# Patient Record
Sex: Male | Born: 1950 | Race: White | Hispanic: No | Marital: Married | Smoking: Current every day smoker
Health system: Southern US, Community
[De-identification: ages and names within clinical notes are randomized; demographics above are authoritative.]

## PROBLEM LIST (undated history)

## (undated) DIAGNOSIS — R519 Headache, unspecified: Secondary | ICD-10-CM

## (undated) DIAGNOSIS — Z9289 Personal history of other medical treatment: Secondary | ICD-10-CM

## (undated) DIAGNOSIS — F0280 Dementia in other diseases classified elsewhere without behavioral disturbance: Secondary | ICD-10-CM

## (undated) DIAGNOSIS — F4310 Post-traumatic stress disorder, unspecified: Secondary | ICD-10-CM

## (undated) DIAGNOSIS — M1990 Unspecified osteoarthritis, unspecified site: Secondary | ICD-10-CM

## (undated) DIAGNOSIS — G309 Alzheimer's disease, unspecified: Secondary | ICD-10-CM

## (undated) DIAGNOSIS — R51 Headache: Secondary | ICD-10-CM

## (undated) HISTORY — PX: APPENDECTOMY: SHX54

## (undated) HISTORY — PX: COLONOSCOPY: SHX174

---

## 2000-01-03 ENCOUNTER — Encounter: Payer: Self-pay | Admitting: Internal Medicine

## 2000-01-03 ENCOUNTER — Encounter: Admission: RE | Admit: 2000-01-03 | Discharge: 2000-01-03 | Payer: Self-pay | Admitting: Internal Medicine

## 2000-01-12 ENCOUNTER — Encounter: Payer: Self-pay | Admitting: Neurosurgery

## 2000-01-12 ENCOUNTER — Encounter: Admission: RE | Admit: 2000-01-12 | Discharge: 2000-01-12 | Payer: Self-pay | Admitting: Neurosurgery

## 2010-05-05 DEATH — deceased

## 2013-04-26 ENCOUNTER — Encounter: Payer: Self-pay | Admitting: Physician Assistant

## 2013-04-26 ENCOUNTER — Ambulatory Visit (INDEPENDENT_AMBULATORY_CARE_PROVIDER_SITE_OTHER): Payer: BC Managed Care – PPO | Admitting: Physician Assistant

## 2013-04-26 VITALS — BP 140/80 | HR 78 | Temp 97.8°F | Resp 18 | Ht 69.0 in | Wt 179.0 lb

## 2013-04-26 DIAGNOSIS — F411 Generalized anxiety disorder: Secondary | ICD-10-CM

## 2013-04-26 DIAGNOSIS — M542 Cervicalgia: Secondary | ICD-10-CM

## 2013-04-26 MED ORDER — MELOXICAM 7.5 MG PO TABS: 7.5000 mg | ORAL_TABLET | Freq: Every day | ORAL | Status: DC

## 2013-04-26 MED ORDER — ALPRAZOLAM 0.5 MG PO TABS: 0.5000 mg | ORAL_TABLET | Freq: Three times a day (TID) | ORAL | Status: DC | PRN

## 2013-04-26 MED ORDER — ESCITALOPRAM OXALATE 10 MG PO TABS: 10.0000 mg | ORAL_TABLET | Freq: Every day | ORAL | Status: DC

## 2013-04-26 MED ORDER — METAXALONE 800 MG PO TABS: 800.0000 mg | ORAL_TABLET | Freq: Four times a day (QID) | ORAL | Status: DC

## 2013-04-27 NOTE — Progress Notes (Signed)
Patient ID: Austin Dillon MRN: [id number], DOB: 1951/04/22, 62 y.o. Date of Encounter: @DATE @  Chief Complaint:  Chief Complaint  Patient presents with  . other    new pt, get est, h/a fotr awhile    HPI: 62 y.o. year old male  presents as a new patient to our office. He has had no medical visit in over 5 years. No PMH. Takes no meds.  Came in today b/c he has been feeling "stressed and a nervous feeling." Feels anxious all the time. Has no particular increased stress to prompt this. Also c/o "headache" which he says is in back of neck into back of head.  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 He is getting married soon. Nothing is really going on in his life that is more stressful than usual.  He says he cant stand feeling this way any longer.   History reviewed. No pertinent past medical history.   Home Meds: See attached medication section for current medication list. Any medications entered into computer today will not appear on this note's list. The medications listed below were entered prior to today. No current outpatient prescriptions on file prior to visit.   No current facility-administered medications on file prior to visit.    Allergies: No Known Allergies  History   Social History  . Marital Status: Married    Spouse Name: N/A    Number of Children: N/A  . Years of Education: N/A   Occupational History  . Not on file.   Social History Main Topics  . Smoking status: Current Every Day Smoker -- 1.00 packs/day for 25 years    Types: Cigarettes  . Smokeless tobacco: Not on file  . Alcohol Use: Not on file  . Drug Use: Not on file  . Sexually Active: Not on file   Other Topics Concern  . Not on file   Social History Narrative  . No narrative on file    No family history on file.   Review of Systems:  See HPI for pertinent ROS. All other ROS negative.    Physical Exam: Blood pressure  140/80, pulse 78, temperature 97.8 F (36.6 C), temperature source Oral, resp. rate 18, height 5\' 9"  (1.753 m), weight 179 lb (81.194 kg)., Body mass index is 26.42 kg/(m^2). General: WNWD WM. Appears in no acute distress. Neck: He has pain and tightness with palpation of posterior neck and bilateral neck and at insertion of muscles at back of head. He has good ROM but does report tightness sensation with this motion. Lungs: Clear bilaterally to auscultation without wheezes, rales, or rhonchi. Breathing is unlabored. Heart: RRR with S1 S2. No murmurs, rubs, or gallops. Musculoskeletal:  Strength and tone normal for age. Extremities/Skin: Warm and dry. No edema. Neuro: Alert and oriented X 3. Moves all extremities spontaneously. Gait is normal. CNII-XII grossly in tact. Psych:  Responds to questions appropriately with a normal affect.     ASSESSMENT AND PLAN:  62 y.o. year old male with  1. Generalized anxiety disorder Discussed proper use and expectations of each med at length. Call if any adverse effects with meds. O/W f/u OV in 6 weeks.  Cautioned that Xanax may make him feel "loopy" and not to take it before driving/operating machinery. - escitalopram (LEXAPRO) 10 MG tablet; Take 1 tablet (10 mg total) by mouth daily.  Dispense: 30 tablet; Refill: 1 - ALPRAZolam (XANAX) 0.5  MG tablet; Take 1 tablet (0.5 mg total) by mouth 3 (three) times daily as needed for anxiety.  Dispense: 30 tablet; Refill: 0  2. Neck pain Discussed proper use/expectation of meds at length. Take Mobic with food. Cautioned that Skelaxin may cause drowsiness. If this occus, only take it at night.  Also to use heat, stretches.  - metaxalone (SKELAXIN) 800 MG tablet; Take 1 tablet (800 mg total) by mouth 4 (four) times daily.  Dispense: 60 tablet; Refill: 0 - meloxicam (MOBIC) 7.5 MG tablet; Take 1 tablet (7.5 mg total) by mouth daily.  Dispense: 30 tablet; Refill: 0  He will have OV in 6 weeks to f/u these issues.  Once this is controlled, he is agreable to come in for CPE.  [place], [place], [facility] 04/27/2013 9:28 AM

## 2013-10-14 ENCOUNTER — Ambulatory Visit: Payer: Self-pay | Admitting: Gastroenterology

## 2013-10-15 LAB — PATHOLOGY REPORT

## 2013-11-20 ENCOUNTER — Ambulatory Visit: Payer: Self-pay | Admitting: Neurology

## 2014-05-22 DIAGNOSIS — R519 Headache, unspecified: Secondary | ICD-10-CM | POA: Insufficient documentation

## 2014-05-22 DIAGNOSIS — K279 Peptic ulcer, site unspecified, unspecified as acute or chronic, without hemorrhage or perforation: Secondary | ICD-10-CM | POA: Insufficient documentation

## 2015-02-25 ENCOUNTER — Ambulatory Visit: Payer: Self-pay | Admitting: Neurology

## 2016-03-30 DIAGNOSIS — F02818 Dementia in other diseases classified elsewhere, unspecified severity, with other behavioral disturbance: Secondary | ICD-10-CM | POA: Insufficient documentation

## 2016-03-30 DIAGNOSIS — E559 Vitamin D deficiency, unspecified: Secondary | ICD-10-CM | POA: Insufficient documentation

## 2016-03-30 DIAGNOSIS — Z720 Tobacco use: Secondary | ICD-10-CM | POA: Insufficient documentation

## 2016-09-03 DIAGNOSIS — G43719 Chronic migraine without aura, intractable, without status migrainosus: Secondary | ICD-10-CM | POA: Insufficient documentation

## 2017-03-08 ENCOUNTER — Other Ambulatory Visit: Payer: Self-pay | Admitting: Neurology

## 2017-03-08 DIAGNOSIS — R29810 Facial weakness: Secondary | ICD-10-CM

## 2017-03-17 ENCOUNTER — Ambulatory Visit: Payer: Medicare Other

## 2017-03-26 ENCOUNTER — Inpatient Hospital Stay (HOSPITAL_COMMUNITY)
Admission: EM | Admit: 2017-03-26 | Discharge: 2017-03-31 | DRG: 494 | Disposition: A | Discharge status: Skilled Nursing Facility | Payer: Medicare Other | Attending: Orthopedic Surgery | Admitting: Orthopedic Surgery

## 2017-03-26 ENCOUNTER — Emergency Department (HOSPITAL_COMMUNITY): Payer: Medicare Other | Admitting: Certified Registered"

## 2017-03-26 ENCOUNTER — Emergency Department (HOSPITAL_COMMUNITY): Payer: Medicare Other

## 2017-03-26 ENCOUNTER — Encounter (HOSPITAL_COMMUNITY)
Admission: EM | Disposition: A | Discharge status: Skilled Nursing Facility | Payer: Self-pay | Source: Home / Self Care | Attending: Orthopaedic Surgery

## 2017-03-26 ENCOUNTER — Encounter (HOSPITAL_COMMUNITY): Payer: Self-pay | Admitting: *Deleted

## 2017-03-26 DIAGNOSIS — W1789XA Other fall from one level to another, initial encounter: Secondary | ICD-10-CM | POA: Diagnosis present

## 2017-03-26 DIAGNOSIS — S82891B Other fracture of right lower leg, initial encounter for open fracture type I or II: Secondary | ICD-10-CM

## 2017-03-26 DIAGNOSIS — S82891C Other fracture of right lower leg, initial encounter for open fracture type IIIA, IIIB, or IIIC: Secondary | ICD-10-CM | POA: Diagnosis not present

## 2017-03-26 DIAGNOSIS — Z79899 Other long term (current) drug therapy: Secondary | ICD-10-CM | POA: Diagnosis not present

## 2017-03-26 DIAGNOSIS — S025XXA Fracture of tooth (traumatic), initial encounter for closed fracture: Secondary | ICD-10-CM | POA: Diagnosis present

## 2017-03-26 DIAGNOSIS — K056 Periodontal disease, unspecified: Secondary | ICD-10-CM | POA: Diagnosis present

## 2017-03-26 DIAGNOSIS — W19XXXA Unspecified fall, initial encounter: Secondary | ICD-10-CM

## 2017-03-26 DIAGNOSIS — F17210 Nicotine dependence, cigarettes, uncomplicated: Secondary | ICD-10-CM | POA: Diagnosis present

## 2017-03-26 DIAGNOSIS — Z7982 Long term (current) use of aspirin: Secondary | ICD-10-CM | POA: Diagnosis not present

## 2017-03-26 DIAGNOSIS — S82851B Displaced trimalleolar fracture of right lower leg, initial encounter for open fracture type I or II: Principal | ICD-10-CM | POA: Diagnosis present

## 2017-03-26 DIAGNOSIS — Y92009 Unspecified place in unspecified non-institutional (private) residence as the place of occurrence of the external cause: Secondary | ICD-10-CM

## 2017-03-26 DIAGNOSIS — Z791 Long term (current) use of non-steroidal anti-inflammatories (NSAID): Secondary | ICD-10-CM | POA: Diagnosis not present

## 2017-03-26 DIAGNOSIS — Z419 Encounter for procedure for purposes other than remedying health state, unspecified: Secondary | ICD-10-CM

## 2017-03-26 DIAGNOSIS — K029 Dental caries, unspecified: Secondary | ICD-10-CM | POA: Diagnosis present

## 2017-03-26 DIAGNOSIS — M25571 Pain in right ankle and joints of right foot: Secondary | ICD-10-CM | POA: Diagnosis present

## 2017-03-26 HISTORY — DX: Alzheimer's disease, unspecified: G30.9

## 2017-03-26 HISTORY — DX: Headache, unspecified: R51.9

## 2017-03-26 HISTORY — PX: EXTERNAL FIXATION LEG: SHX1549

## 2017-03-26 HISTORY — DX: Dementia in other diseases classified elsewhere, unspecified severity, without behavioral disturbance, psychotic disturbance, mood disturbance, and anxiety: F02.80

## 2017-03-26 HISTORY — DX: Headache: R51

## 2017-03-26 LAB — COMPREHENSIVE METABOLIC PANEL
ALK PHOS: 65 U/L (ref 38–126)
ALT: 49 U/L (ref 17–63)
ANION GAP: 15 (ref 5–15)
AST: 56 U/L — ABNORMAL HIGH (ref 15–41)
Albumin: 3.5 g/dL (ref 3.5–5.0)
BUN: 6 mg/dL (ref 6–20)
CALCIUM: 8.3 mg/dL — AB (ref 8.9–10.3)
CHLORIDE: 101 mmol/L (ref 101–111)
CO2: 19 mmol/L — AB (ref 22–32)
Creatinine, Ser: 1.03 mg/dL (ref 0.61–1.24)
GFR calc non Af Amer: >60 mL/min (ref >60)
GLUCOSE: 127 mg/dL — AB (ref 65–99)
POTASSIUM: 4.1 mmol/L (ref 3.5–5.1)
SODIUM: 135 mmol/L (ref 135–145)
Total Bilirubin: 1 mg/dL (ref 0.3–1.2)
Total Protein: 6.2 g/dL — ABNORMAL LOW (ref 6.5–8.1)

## 2017-03-26 LAB — CBC WITH DIFFERENTIAL/PLATELET
BASOS PCT: 0 %
Basophils Absolute: 0 10*3/uL (ref 0.0–0.1)
Eosinophils Absolute: 0.1 10*3/uL (ref 0.0–0.7)
Eosinophils Relative: 0 %
HCT: 41.5 % (ref 39.0–52.0)
HEMOGLOBIN: 14.4 g/dL (ref 13.0–17.0)
Lymphocytes Relative: 16 %
Lymphs Abs: 2.2 10*3/uL (ref 0.7–4.0)
MCH: 34.3 pg — AB (ref 26.0–34.0)
MCHC: 34.7 g/dL (ref 30.0–36.0)
MCV: 98.8 fL (ref 78.0–100.0)
MONOS PCT: 4 %
Monocytes Absolute: 0.5 10*3/uL (ref 0.1–1.0)
NEUTROS ABS: 11.1 10*3/uL — AB (ref 1.7–7.7)
NEUTROS PCT: 80 %
PLATELETS: 164 10*3/uL (ref 150–400)
RBC: 4.2 MIL/uL — ABNORMAL LOW (ref 4.22–5.81)
RDW: 12.1 % (ref 11.5–15.5)
WBC: 13.9 10*3/uL — ABNORMAL HIGH (ref 4.0–10.5)

## 2017-03-26 LAB — PROTIME-INR
INR: 1
Prothrombin Time: 13.2 seconds (ref 11.4–15.2)

## 2017-03-26 SURGERY — EXTERNAL FIXATION, LOWER EXTREMITY
Anesthesia: General | Site: Leg Lower | Laterality: Right

## 2017-03-26 MED ORDER — SUCCINYLCHOLINE CHLORIDE 200 MG/10ML IV SOSY
PREFILLED_SYRINGE | INTRAVENOUS | Status: AC
  Filled 2017-03-26: qty 10

## 2017-03-26 MED ORDER — DIPHENHYDRAMINE HCL 12.5 MG/5ML PO ELIX: 12.5000 mg | ORAL_SOLUTION | ORAL | Status: DC | PRN

## 2017-03-26 MED ORDER — PROPOFOL 10 MG/ML IV BOLUS
INTRAVENOUS | Status: DC | PRN
  Administered 2017-03-26: 150 mg via INTRAVENOUS

## 2017-03-26 MED ORDER — SUGAMMADEX SODIUM 200 MG/2ML IV SOLN
INTRAVENOUS | Status: DC | PRN
  Administered 2017-03-26: 200 mg via INTRAVENOUS

## 2017-03-26 MED ORDER — EPHEDRINE 5 MG/ML INJ
INTRAVENOUS | Status: AC
  Filled 2017-03-26: qty 10

## 2017-03-26 MED ORDER — CLINDAMYCIN PHOSPHATE 900 MG/50ML IV SOLN: 900.0000 mg | INTRAVENOUS | Status: DC

## 2017-03-26 MED ORDER — METHOCARBAMOL 500 MG PO TABS
500.0000 mg | ORAL_TABLET | Freq: Four times a day (QID) | ORAL | Status: DC | PRN
  Administered 2017-03-26 – 2017-03-29 (×8): 500 mg via ORAL
  Filled 2017-03-26 (×9): qty 1

## 2017-03-26 MED ORDER — PROPOFOL 10 MG/ML IV BOLUS
INTRAVENOUS | Status: AC
  Filled 2017-03-26: qty 20

## 2017-03-26 MED ORDER — CLINDAMYCIN PHOSPHATE 900 MG/50ML IV SOLN
INTRAVENOUS | Status: AC
Start: 2017-03-26 — End: 2017-03-26
  Filled 2017-03-26: qty 50

## 2017-03-26 MED ORDER — DULOXETINE HCL 20 MG PO CPEP
20.0000 mg | ORAL_CAPSULE | Freq: Every day | ORAL | Status: DC
  Administered 2017-03-27 – 2017-03-31 (×4): 20 mg via ORAL
  Filled 2017-03-26 (×5): qty 1

## 2017-03-26 MED ORDER — SODIUM CHLORIDE 0.9 % IR SOLN
Status: DC | PRN
  Administered 2017-03-26: 3000 mL

## 2017-03-26 MED ORDER — FENTANYL CITRATE (PF) 100 MCG/2ML IJ SOLN
100.0000 ug | Freq: Once | INTRAMUSCULAR | Status: AC
  Administered 2017-03-26: 100 ug via INTRAVENOUS
  Filled 2017-03-26: qty 2

## 2017-03-26 MED ORDER — ACETAMINOPHEN 650 MG RE SUPP: 650.0000 mg | Freq: Four times a day (QID) | RECTAL | Status: DC | PRN

## 2017-03-26 MED ORDER — MIDAZOLAM HCL 2 MG/2ML IJ SOLN
INTRAMUSCULAR | Status: AC
  Filled 2017-03-26: qty 2

## 2017-03-26 MED ORDER — ROCURONIUM BROMIDE 10 MG/ML (PF) SYRINGE
PREFILLED_SYRINGE | INTRAVENOUS | Status: AC
  Filled 2017-03-26: qty 5

## 2017-03-26 MED ORDER — POVIDONE-IODINE 10 % EX SWAB: 2.0000 "application " | Freq: Once | CUTANEOUS | Status: DC

## 2017-03-26 MED ORDER — FENTANYL CITRATE (PF) 250 MCG/5ML IJ SOLN
INTRAMUSCULAR | Status: DC | PRN
  Administered 2017-03-26: 100 ug via INTRAVENOUS
  Administered 2017-03-26: 50 ug via INTRAVENOUS

## 2017-03-26 MED ORDER — LAMOTRIGINE 100 MG PO TABS: 100.0000 mg | ORAL_TABLET | ORAL | Status: DC

## 2017-03-26 MED ORDER — CEFAZOLIN IN D5W 1 GM/50ML IV SOLN
1.0000 g | Freq: Four times a day (QID) | INTRAVENOUS | Status: DC
  Administered 2017-03-27: 1 g via INTRAVENOUS
  Filled 2017-03-26 (×3): qty 50

## 2017-03-26 MED ORDER — OXYCODONE HCL 5 MG PO TABS
5.0000 mg | ORAL_TABLET | ORAL | Status: DC | PRN
  Administered 2017-03-27 – 2017-03-29 (×13): 10 mg via ORAL
  Filled 2017-03-26 (×13): qty 2

## 2017-03-26 MED ORDER — 0.9 % SODIUM CHLORIDE (POUR BTL) OPTIME
TOPICAL | Status: DC | PRN
  Administered 2017-03-26: 1000 mL

## 2017-03-26 MED ORDER — BUTALBITAL-APAP-CAFFEINE 50-325-40 MG PO TABS: 1.0000 | ORAL_TABLET | Freq: Every day | ORAL | Status: DC | PRN

## 2017-03-26 MED ORDER — ROCURONIUM BROMIDE 10 MG/ML (PF) SYRINGE
PREFILLED_SYRINGE | INTRAVENOUS | Status: DC | PRN
  Administered 2017-03-26: 30 mg via INTRAVENOUS

## 2017-03-26 MED ORDER — LIDOCAINE 2% (20 MG/ML) 5 ML SYRINGE
INTRAMUSCULAR | Status: DC | PRN
  Administered 2017-03-26: 60 mg via INTRAVENOUS

## 2017-03-26 MED ORDER — PHENYLEPHRINE 40 MCG/ML (10ML) SYRINGE FOR IV PUSH (FOR BLOOD PRESSURE SUPPORT)
PREFILLED_SYRINGE | INTRAVENOUS | Status: DC | PRN
  Administered 2017-03-26 (×2): 80 ug via INTRAVENOUS

## 2017-03-26 MED ORDER — ACETAMINOPHEN 325 MG PO TABS
650.0000 mg | ORAL_TABLET | Freq: Four times a day (QID) | ORAL | Status: DC | PRN
  Administered 2017-03-27 – 2017-03-28 (×2): 650 mg via ORAL
  Filled 2017-03-26 (×2): qty 2

## 2017-03-26 MED ORDER — ONDANSETRON HCL 4 MG/2ML IJ SOLN
4.0000 mg | Freq: Once | INTRAMUSCULAR | Status: AC
  Administered 2017-03-26: 4 mg via INTRAVENOUS
  Filled 2017-03-26: qty 2

## 2017-03-26 MED ORDER — ALPRAZOLAM 0.5 MG PO TABS
2.0000 mg | ORAL_TABLET | Freq: Three times a day (TID) | ORAL | Status: DC | PRN
  Administered 2017-03-27 – 2017-03-30 (×5): 2 mg via ORAL
  Filled 2017-03-26 (×6): qty 4

## 2017-03-26 MED ORDER — HYDROMORPHONE HCL 1 MG/ML IJ SOLN
1.0000 mg | Freq: Once | INTRAMUSCULAR | Status: AC
  Administered 2017-03-26: 1 mg via INTRAVENOUS
  Filled 2017-03-26: qty 1

## 2017-03-26 MED ORDER — LACTATED RINGERS IV SOLN
INTRAVENOUS | Status: DC | PRN
  Administered 2017-03-26: 21:00:00 via INTRAVENOUS

## 2017-03-26 MED ORDER — METOCLOPRAMIDE HCL 5 MG PO TABS: 5.0000 mg | ORAL_TABLET | Freq: Three times a day (TID) | ORAL | Status: DC | PRN

## 2017-03-26 MED ORDER — HYDROMORPHONE HCL 1 MG/ML IJ SOLN
1.0000 mg | INTRAMUSCULAR | Status: DC | PRN
  Administered 2017-03-27 – 2017-03-29 (×10): 1 mg via INTRAVENOUS
  Filled 2017-03-26 (×11): qty 1

## 2017-03-26 MED ORDER — SODIUM CHLORIDE 0.9 % IV SOLN
INTRAVENOUS | Status: DC
  Administered 2017-03-27: via INTRAVENOUS

## 2017-03-26 MED ORDER — MIDAZOLAM HCL 5 MG/5ML IJ SOLN
INTRAMUSCULAR | Status: DC | PRN
  Administered 2017-03-26: 2 mg via INTRAVENOUS

## 2017-03-26 MED ORDER — ONDANSETRON HCL 4 MG/2ML IJ SOLN
4.0000 mg | Freq: Four times a day (QID) | INTRAMUSCULAR | Status: DC | PRN
  Administered 2017-03-29: 4 mg via INTRAVENOUS

## 2017-03-26 MED ORDER — LAMOTRIGINE 100 MG PO TABS
100.0000 mg | ORAL_TABLET | Freq: Every day | ORAL | Status: DC
  Administered 2017-03-27 – 2017-03-31 (×4): 100 mg via ORAL
  Filled 2017-03-26 (×6): qty 1

## 2017-03-26 MED ORDER — LAMOTRIGINE 100 MG PO TABS
150.0000 mg | ORAL_TABLET | Freq: Every day | ORAL | Status: DC
  Administered 2017-03-27 – 2017-03-30 (×5): 150 mg via ORAL
  Filled 2017-03-26: qty 1.5
  Filled 2017-03-26 (×4): qty 2

## 2017-03-26 MED ORDER — METOCLOPRAMIDE HCL 5 MG/ML IJ SOLN: 5.0000 mg | Freq: Three times a day (TID) | INTRAMUSCULAR | Status: DC | PRN

## 2017-03-26 MED ORDER — ONDANSETRON HCL 4 MG PO TABS: 4.0000 mg | ORAL_TABLET | Freq: Four times a day (QID) | ORAL | Status: DC | PRN

## 2017-03-26 MED ORDER — METHOCARBAMOL 500 MG PO TABS
ORAL_TABLET | ORAL | Status: AC
  Filled 2017-03-26: qty 1

## 2017-03-26 MED ORDER — CEFAZOLIN IN D5W 1 GM/50ML IV SOLN
1.0000 g | Freq: Once | INTRAVENOUS | Status: AC
  Administered 2017-03-26: 1 g via INTRAVENOUS
  Filled 2017-03-26: qty 50

## 2017-03-26 MED ORDER — KETAMINE HCL-SODIUM CHLORIDE 100-0.9 MG/10ML-% IV SOSY: 1.0000 mg/kg | PREFILLED_SYRINGE | Freq: Once | INTRAVENOUS | Status: DC

## 2017-03-26 MED ORDER — LIDOCAINE 2% (20 MG/ML) 5 ML SYRINGE
INTRAMUSCULAR | Status: AC
  Filled 2017-03-26: qty 5

## 2017-03-26 MED ORDER — ONDANSETRON HCL 4 MG/2ML IJ SOLN
INTRAMUSCULAR | Status: DC | PRN
  Administered 2017-03-26: 4 mg via INTRAVENOUS

## 2017-03-26 MED ORDER — FENTANYL CITRATE (PF) 100 MCG/2ML IJ SOLN
25.0000 ug | INTRAMUSCULAR | Status: DC | PRN
  Administered 2017-03-26 (×2): 25 ug via INTRAVENOUS

## 2017-03-26 MED ORDER — CLINDAMYCIN PHOSPHATE 900 MG/50ML IV SOLN
INTRAVENOUS | Status: DC | PRN
  Administered 2017-03-26: 900 mg via INTRAVENOUS

## 2017-03-26 MED ORDER — FENTANYL CITRATE (PF) 100 MCG/2ML IJ SOLN
INTRAMUSCULAR | Status: AC
  Filled 2017-03-26: qty 2

## 2017-03-26 MED ORDER — METHOCARBAMOL 1000 MG/10ML IJ SOLN: 500.0000 mg | Freq: Four times a day (QID) | INTRAVENOUS | Status: DC | PRN

## 2017-03-26 MED ORDER — CHLORHEXIDINE GLUCONATE 4 % EX LIQD: 60.0000 mL | Freq: Once | CUTANEOUS | Status: DC

## 2017-03-26 MED ORDER — FENTANYL CITRATE (PF) 250 MCG/5ML IJ SOLN
INTRAMUSCULAR | Status: AC
  Filled 2017-03-26: qty 5

## 2017-03-26 MED ORDER — PHENYLEPHRINE 40 MCG/ML (10ML) SYRINGE FOR IV PUSH (FOR BLOOD PRESSURE SUPPORT)
PREFILLED_SYRINGE | INTRAVENOUS | Status: AC
  Filled 2017-03-26: qty 10

## 2017-03-26 MED ORDER — SUCCINYLCHOLINE CHLORIDE 200 MG/10ML IV SOSY
PREFILLED_SYRINGE | INTRAVENOUS | Status: DC | PRN
  Administered 2017-03-26: 100 mg via INTRAVENOUS

## 2017-03-26 SURGICAL SUPPLY — 39 items
BANDAGE ACE 4X5 VEL STRL LF (GAUZE/BANDAGES/DRESSINGS) ×4 IMPLANT
BAR EXFX 350X11 NS LF (EXFIX) ×1
BAR GLASS FIBER EXFX 11X350 (EXFIX) ×2 IMPLANT
BNDG GAUZE ELAST 4 BULKY (GAUZE/BANDAGES/DRESSINGS) ×2 IMPLANT
CLAMP BLUE BAR TO PIN (EXFIX) ×4 IMPLANT
COVER SURGICAL LIGHT HANDLE (MISCELLANEOUS) ×3 IMPLANT
DRAPE C-ARM 42X72 X-RAY (DRAPES) ×3 IMPLANT
DRAPE C-ARMOR (DRAPES) ×2 IMPLANT
DRAPE ORTHO SPLIT 77X108 STRL (DRAPES) ×6
DRAPE SURG ORHT 6 SPLT 77X108 (DRAPES) ×2 IMPLANT
DRAPE U-SHAPE 47X51 STRL (DRAPES) ×3 IMPLANT
DRSG PAD ABDOMINAL 8X10 ST (GAUZE/BANDAGES/DRESSINGS) ×2 IMPLANT
ELECT REM PT RETURN 9FT ADLT (ELECTROSURGICAL) ×3
ELECTRODE REM PT RTRN 9FT ADLT (ELECTROSURGICAL) IMPLANT
GAUZE SPONGE 4X4 12PLY STRL (GAUZE/BANDAGES/DRESSINGS) ×2 IMPLANT
GAUZE XEROFORM 5X9 LF (GAUZE/BANDAGES/DRESSINGS) ×2 IMPLANT
GLOVE BIOGEL PI IND STRL 8 (GLOVE) IMPLANT
GLOVE BIOGEL PI INDICATOR 8 (GLOVE) ×2
GLOVE ORTHO TXT STRL SZ7.5 (GLOVE) ×3 IMPLANT
GOWN STRL REUS W/ TWL LRG LVL3 (GOWN DISPOSABLE) ×1 IMPLANT
GOWN STRL REUS W/ TWL XL LVL3 (GOWN DISPOSABLE) ×4 IMPLANT
GOWN STRL REUS W/TWL LRG LVL3 (GOWN DISPOSABLE) ×6
GOWN STRL REUS W/TWL XL LVL3 (GOWN DISPOSABLE) ×3
KIT BASIN OR (CUSTOM PROCEDURE TRAY) ×3 IMPLANT
KIT ROOM TURNOVER OR (KITS) ×3 IMPLANT
MANIFOLD NEPTUNE II (INSTRUMENTS) ×3 IMPLANT
NS IRRIG 1000ML POUR BTL (IV SOLUTION) ×3 IMPLANT
PACK ORTHO EXTREMITY (CUSTOM PROCEDURE TRAY) ×3 IMPLANT
PAD ARMBOARD 7.5X6 YLW CONV (MISCELLANEOUS) ×6 IMPLANT
PAD CAST 4YDX4 CTTN HI CHSV (CAST SUPPLIES) IMPLANT
PADDING CAST COTTON 4X4 STRL (CAST SUPPLIES) ×3
PIN CLAMP 2BAR 75MM BLUE (EXFIX) ×2 IMPLANT
PIN HALF YELLOW 5X160X35 (EXFIX) ×4 IMPLANT
PIN TRANSFIXING 5.0 (EXFIX) ×2 IMPLANT
SPONGE LAP 18X18 X RAY DECT (DISPOSABLE) ×1 IMPLANT
SUT ETHILON 2 0 FS 18 (SUTURE) ×4 IMPLANT
TOWEL OR 17X24 6PK STRL BLUE (TOWEL DISPOSABLE) ×3 IMPLANT
TOWEL OR 17X26 10 PK STRL BLUE (TOWEL DISPOSABLE) ×3 IMPLANT
UNDERPAD 30X30 (UNDERPADS AND DIAPERS) ×3 IMPLANT

## 2017-03-26 NOTE — Op Note (Signed)
[name], Dillon NO.:  [id number]  MEDICAL RECORD NO.:  [id number]  LOCATION:  [facility]                         FACILITY:  [facility]  PHYSICIAN:  Lind Guest. Ninfa Linden, M.D.DATE OF BIRTH:  12/01/1951  DATE OF PROCEDURE:  03/26/2017 DATE OF DISCHARGE:                              OPERATIVE REPORT   PREOPERATIVE DIAGNOSIS:  Right open trimalleolar ankle fracture dislocation.  POSTOPERATIVE DIAGNOSIS:  Right grade 1A open trimalleolar ankle fracture dislocation.  PROCEDURE: 1. Irrigation and debridement of right open ankle wound with excision     and debridement of necrotic skin with sharp excisional debridement     of necrotic skin and removal of dirt debris from the end of the     medial tibia. 2. Temporary stabilization of right trimalleolar open ankle fracture     with uniplanar spanning, external fixation spanning of the right     ankle joint.  IMPLANTS:  Zimmer external fixation.  SURGEON:  Lind Guest. Ninfa Linden, M.D.  ANESTHESIA:  General.  ANTIBIOTICS:  900 mg of IV clindamycin.  COMPLICATIONS:  None.  INDICATIONS:  Austin Dillon is a 66 year old gentleman, who is a smoker, who fell off a 12 foot wall at his home landing on his right ankle.  He sustained a significant injury to that ankle and dragged himself to the house.  He was then transported via EMS to the emergency room at [facility].  In the emergency room, he had a medial ankle wound measuring about 5 cm with exposed distal tibia and obvious gross deformity.  I performed a reduction in the emergency room and placed a well-padded dressing and splinted him to proceed to the operating room for more definitive surgery.  I talked to the family in length about the need for thoroughly washing the wound and picking on any dirt debris, closing the skin and then holding the ankle in its reduced position as possible with external fixation.  Informed consent was obtained after the risks and benefits  were discussed.  PROCEDURE DESCRIPTION:  After informed consent was obtained, appropriate right leg was marked.  He was brought to the operating room, placed supine on the operating room table.  General anesthesia was then obtained.  A pre-scrub was then obtained of his ankle and then we prepped the ankle with Betadine scrub and paint.  Time-out was called to identify correct patient and correct right ankle.  I then re-dislocated the ankle to expose the distal tibia and removed some dirt particulate matter from the end of the distal tibia, found remainder of the joint to be an intact, but significant cartilage disruption of the talar dome and the plafond of the distal tibia.  I then used 3 L normal saline solution to irrigate the ankle.  I then reduced the talus under the distal tibia as best we could.  I placed two external fixation pins from anterior to posterior direction in the tibia and one through the calcaneus.  We then placed a delta frame construct to hold the fracture in a reduced position.  My main concern is the medial malleolus piece, which is significantly displaced, but our goal was to take pressure off the soft tissue for  ligamentotaxis to allow for soft tissue stabilization prior to definitive fixation.  This was all performed under direct fluoroscopic guidance.  We then reapproximated the medial ankle wound with interrupted 2-0 nylon suture.  Xeroform and well-padded sterile dressing was applied.  He was awakened, extubated, and taken to the recovery room in stable condition.  All final counts were correct. There were no complications noted.     Lind Guest. Ninfa Linden, M.D.     CYB/MEDQ  D:  03/26/2017  T:  03/26/2017  Job:  [id number]

## 2017-03-26 NOTE — ED Notes (Addendum)
Dr. Rush Farmer at bedside . He reduced the dislocation and ortho tech arrived to apply splint . He explained surgical plan /plan of care .

## 2017-03-26 NOTE — Anesthesia Preprocedure Evaluation (Addendum)
Anesthesia Evaluation  Patient identified by MRN, date of birth, ID band Patient awake    Reviewed: Allergy & Precautions, NPO status , Patient's Chart, lab work & pertinent test results  Airway Mallampati: III  TM Distance: >3 FB Neck ROM: Full    Dental  (+) Dental Advisory Given, Poor Dentition   Pulmonary neg pulmonary ROS, Current Smoker,    breath sounds clear to auscultation       Cardiovascular negative cardio ROS   Rhythm:Regular Rate:Normal     Neuro/Psych  Headaches, PSYCHIATRIC DISORDERS    GI/Hepatic negative GI ROS, Neg liver ROS,   Endo/Other  negative endocrine ROS  Renal/GU negative Renal ROS     Musculoskeletal Right ankle fx   Abdominal   Peds  Hematology negative hematology ROS (+)   Anesthesia Other Findings   Reproductive/Obstetrics                            Lab Results  Component Value Date   WBC 13.9 (H) 03/26/2017   HGB 14.4 03/26/2017   HCT 41.5 03/26/2017   MCV 98.8 03/26/2017   PLT 164 03/26/2017   Lab Results  Component Value Date   CREATININE 1.03 03/26/2017   BUN 6 03/26/2017   NA 135 03/26/2017   K 4.1 03/26/2017   CL 101 03/26/2017   CO2 19 (L) 03/26/2017    Anesthesia Physical Anesthesia Plan  ASA: III and emergent  Anesthesia Plan: General   Post-op Pain Management:    Induction: Intravenous  Airway Management Planned: Oral ETT  Additional Equipment:   Intra-op Plan:   Post-operative Plan: Extubation in OR  Informed Consent: I have reviewed the patients History and Physical, chart, labs and discussed the procedure including the risks, benefits and alternatives for the proposed anesthesia with the patient or authorized representative who has indicated his/her understanding and acceptance.   Dental advisory given  Plan Discussed with: CRNA  Anesthesia Plan Comments:        Anesthesia Quick Evaluation

## 2017-03-26 NOTE — Progress Notes (Signed)
Orthopedic Tech Progress Note Patient Details:  Austin Dillon 1951/09/10 [id number]  Ortho Devices Type of Ortho Device: Stirrup splint, Post (short leg) splint Ortho Device/Splint Location: rle Ortho Device/Splint Interventions: Ordered, Application Assisted dr with reduction and plaster splint application  Karolee Stamps 03/26/2017, 8:42 PM

## 2017-03-26 NOTE — ED Notes (Signed)
Pt. given Dilaudid 1 mg and Zofran 4 mg IV for recurring pain at right ankle .

## 2017-03-26 NOTE — ED Provider Notes (Signed)
[facility] DEPT Provider Note   CSN: [id number] Arrival date & time: 03/26/17  1859     History   Chief Complaint Chief Complaint  Patient presents with  . Fall    HPI Austin Dillon is a 66 y.o. male.  The history is provided by the patient.  Ankle Pain   The incident occurred less than 1 hour ago. The incident occurred at home. The injury mechanism was a fall. The pain is present in the right ankle. The quality of the pain is described as sharp. The pain is at a severity of 10/10. The pain is severe. The pain has been constant since onset. Associated symptoms include inability to bear weight and loss of motion. Pertinent negatives include no numbness and no loss of sensation. He reports no foreign bodies present. The symptoms are aggravated by bearing weight. He has tried nothing for the symptoms. The treatment provided no relief.    No past medical history on file.  There are no active problems to display for this patient.   No past surgical history on file.     Home Medications    Prior to Admission medications   Medication Sig Start Date End Date Taking? Authorizing Provider  ALPRAZolam Duanne Moron) 0.5 MG tablet Take 1 tablet (0.5 mg total) by mouth 3 (three) times daily as needed for anxiety. 04/26/13   Lonie Peak Dixon, PA-C  escitalopram (LEXAPRO) 10 MG tablet Take 1 tablet (10 mg total) by mouth daily. 04/26/13   Lonie Peak Dixon, PA-C  meloxicam (MOBIC) 7.5 MG tablet Take 1 tablet (7.5 mg total) by mouth daily. 04/26/13   Lonie Peak Dixon, PA-C  metaxalone (SKELAXIN) 800 MG tablet Take 1 tablet (800 mg total) by mouth 4 (four) times daily. 04/26/13   Orlena Sheldon, PA-C    Family History No family history on file.  Social History Social History  Substance Use Topics  . Smoking status: Current Every Day Smoker    Packs/day: 1.00    Years: 25.00    Types: Cigarettes  . Smokeless tobacco: Not on file  . Alcohol use Not on file     Allergies   Atropine   Review of  Systems Review of Systems  Constitutional: Negative for chills and fever.  HENT: Negative for ear pain and sore throat.   Eyes: Negative for pain and visual disturbance.  Respiratory: Negative for cough and shortness of breath.   Cardiovascular: Negative for chest pain and palpitations.  Gastrointestinal: Negative for abdominal pain and vomiting.  Genitourinary: Negative for dysuria and hematuria.  Musculoskeletal: Positive for arthralgias and gait problem. Negative for back pain.  Skin: Positive for wound. Negative for color change and rash.  Neurological: Negative for seizures, syncope and numbness.  All other systems reviewed and are negative.    Physical Exam Updated Vital Signs BP 129/72   Pulse 85   Temp 98 F (36.7 C) (Temporal)   Resp 14   SpO2 90%   Physical Exam  Constitutional: He appears well-developed and well-nourished. He appears distressed.  HENT:  Head: Normocephalic and atraumatic.  Eyes: Conjunctivae are normal.  Neck: Neck supple.  Cardiovascular: Normal rate and regular rhythm.   No murmur heard. Pulmonary/Chest: Effort normal and breath sounds normal. No respiratory distress.  Abdominal: Soft. There is no tenderness.  Musculoskeletal: He exhibits no edema.       Right ankle: He exhibits decreased range of motion and deformity. He exhibits normal pulse. Tenderness.  Cervical back: He exhibits normal range of motion, no tenderness, no bony tenderness, no deformity and no pain.       Thoracic back: He exhibits normal range of motion, no tenderness, no bony tenderness, no deformity and no pain.       Feet:  Neurological: He is alert. No cranial nerve deficit or sensory deficit. GCS eye subscore is 4. GCS verbal subscore is 5. GCS motor subscore is 6.  Skin: Skin is warm and dry.  Psychiatric: He has a normal mood and affect.  Nursing note and vitals reviewed.    ED Treatments / Results  Labs (all labs ordered are listed, but only abnormal  results are displayed) Labs Reviewed  CBC WITH DIFFERENTIAL/PLATELET - Abnormal; Notable for the following:       Result Value   WBC 13.9 (*)    RBC 4.20 (*)    MCH 34.3 (*)    Neutro Abs 11.1 (*)    All other components within normal limits  COMPREHENSIVE METABOLIC PANEL  PROTIME-INR    EKG  EKG Interpretation  Date/Time:  Sunday March 26 2017 19:07:57 EDT Ventricular Rate:  77 PR Interval:    QRS Duration: 109 QT Interval:  409 QTC Calculation: 463 R Axis:   11 Text Interpretation:  Sinus rhythm RSR' in V1 or V2, right VCD or RVH Confirmed by ZAVITZ MD, JOSHUA [phone]) on 03/26/2017 7:13:01 PM       Radiology Dg Tibia/fibula Right Port  Result Date: 03/26/2017 CLINICAL DATA:  Ankle pain after fall 12 feet. EXAM: PORTABLE RIGHT TIBIA AND FIBULA - 2 VIEW COMPARISON:  None. FINDINGS: There is an acute, closed, bimalleolar and likely trimalleolar fracture-dislocation at the ankle joint with the talar dome laterally dislocated relative to the tibial plafond as well as 90 degree laterally rotated. No dislocation of the knee joint. Calcaneal enthesophytes are present along the plantar dorsal aspect. No apparent calcaneal fracture. Midfoot articulations and subtalar joints are grossly intact. There is an accessory ossicle adjacent to cuboid. IMPRESSION: There is an acute, closed, bimalleolar and likely trimalleolar fracture-dislocation at the ankle joint with the talar dome laterally dislocated relative to the tibial plafond as well as 90 degree externally rotated. Electronically Signed   By: Ashley Royalty M.D.   On: 03/26/2017 19:59   Dg Ankle Right Port  Result Date: 03/26/2017 CLINICAL DATA:  Acute right ankle pain after 12 foot fall from a wall. EXAM: PORTABLE RIGHT ANKLE - 2 VIEW COMPARISON:  None. FINDINGS: There is an acute, closed, bimalleolar and likely trimalleolar fracture-dislocation at the ankle joint with the talar dome laterally dislocated relative to the tibial plafond as  well as 90 degree laterally rotated. There is a transverse fracture through the medial malleolus with an oblique fracture through the distal diaphysis of the fibula. An adjacent ossific density may represent a posterior malleolar fracture fragment. IMPRESSION: There is an acute, closed, bimalleolar and likely trimalleolar fracture-dislocation at the ankle joint with the talar dome laterally dislocated relative to the tibial plafond as well as 90 degree laterally rotated. Electronically Signed   By: Ashley Royalty M.D.   On: 03/26/2017 19:57    Procedures Procedures (including critical care time)  Medications Ordered in ED Medications  ceFAZolin (ANCEF) IVPB 1 g/50 mL premix (0 g Intravenous Stopped 03/26/17 1946)  fentaNYL (SUBLIMAZE) injection 100 mcg (100 mcg Intravenous Given 03/26/17 1922)     Initial Impression / Assessment and Plan / ED Course  I have reviewed the triage vital  signs and the nursing notes.  Pertinent labs & imaging results that were available during my care of the patient were reviewed by me and considered in my medical decision making (see chart for details).     66 year old male with no significant past medical history presents in the setting of mechanical fall with open fracture. Patient was spraying for weeds on a 12 foot high wall when he slipped off falling onto right leg. Patient had immediate deformity leg and fell onto buttocks. Patient did not strike head nor have loss of consciousness. Patient was able to crawl to house and wife called EMS.  On arrival patient was noted to have open fracture to right lower leg. Patient was able to move toes and sensation was intact. Patient with palpable pulses. Patient denied any other complaints of and no other signs of external trauma on examination. Imaging revealed open ankle fracture. Basic laboratory analysis obtained without significant abnormalities and patient is not on blood thinners.  Discussed case with orthopedic team  and patient will require admission to the hospital for further management as condition. Dislocation reduced at bedside by orthopedic surgeon without complication. Patient given pain medication and antibiotics early in course with improvement in pain on reassessment. Patient stable at time of transfer of care.  Final Clinical Impressions(s) / ED Diagnoses   Final diagnoses:  Fall  Fall, initial encounter  Type III open fracture of right ankle, initial encounter    New Prescriptions New Prescriptions   No medications on file     Esaw Grandchild, MD 03/26/17 2023    Elnora Morrison, MD 03/26/17 2354

## 2017-03-26 NOTE — H&P (Signed)
Austin Dillon is an 66 y.o. male.   Chief Complaint: right ankle pain with known fracture/dislocation HPI:   66 yo male who accidentally fell off of a wall onto his right ankle causing a severe deformity.  Was brought to the ED with a right open ankle fracture/dislocation.  Ortho was consulted.  The patient does report drinking alcohol.  He reports severe right ankle pain and denies other injuries.  No past medical history on file.  No past surgical history on file.  No family history on file. Social History:  reports that he has been smoking Cigarettes.  He has a 25.00 pack-year smoking history. He does not have any smokeless tobacco history on file. His alcohol and drug histories are not on file.  Allergies:  Allergies  Allergen Reactions  . Atropine Other (See Comments)    Severe mood swings  . Nortriptyline Other (See Comments)    Severe mood swings     (Not in a hospital admission)  Results for orders placed or performed during the hospital encounter of 03/26/17 (from the past 48 hour(s))  CBC with Differential/Platelet     Status: Abnormal   Collection Time: 03/26/17  7:38 PM  Result Value Ref Range   WBC 13.9 (H) 4.0 - 10.5 K/uL   RBC 4.20 (L) 4.22 - 5.81 MIL/uL   Hemoglobin 14.4 13.0 - 17.0 g/dL   HCT 41.5 39.0 - 52.0 %   MCV 98.8 78.0 - 100.0 fL   MCH 34.3 (H) 26.0 - 34.0 pg   MCHC 34.7 30.0 - 36.0 g/dL   RDW 12.1 11.5 - 15.5 %   Platelets 164 150 - 400 K/uL   Neutrophils Relative % 80 %   Neutro Abs 11.1 (H) 1.7 - 7.7 K/uL   Lymphocytes Relative 16 %   Lymphs Abs 2.2 0.7 - 4.0 K/uL   Monocytes Relative 4 %   Monocytes Absolute 0.5 0.1 - 1.0 K/uL   Eosinophils Relative 0 %   Eosinophils Absolute 0.1 0.0 - 0.7 K/uL   Basophils Relative 0 %   Basophils Absolute 0.0 0.0 - 0.1 K/uL  Comprehensive metabolic panel     Status: Abnormal   Collection Time: 03/26/17  7:38 PM  Result Value Ref Range   Sodium 135 135 - 145 mmol/L   Potassium 4.1 3.5 - 5.1 mmol/L   Chloride 101 101 - 111 mmol/L   CO2 19 (L) 22 - 32 mmol/L   Glucose, Bld 127 (H) 65 - 99 mg/dL   BUN 6 6 - 20 mg/dL   Creatinine, Ser 1.03 0.61 - 1.24 mg/dL   Calcium 8.3 (L) 8.9 - 10.3 mg/dL   Total Protein 6.2 (L) 6.5 - 8.1 g/dL   Albumin 3.5 3.5 - 5.0 g/dL   AST 56 (H) 15 - 41 U/L   ALT 49 17 - 63 U/L   Alkaline Phosphatase 65 38 - 126 U/L   Total Bilirubin 1.0 0.3 - 1.2 mg/dL   GFR calc non Af Amer >60 >60 mL/min   GFR calc Af Amer >60 >60 mL/min    Comment: (NOTE) The eGFR has been calculated using the CKD EPI equation. This calculation has not been validated in all clinical situations. eGFR's persistently <60 mL/min signify possible Chronic Kidney Disease.    Anion gap 15 5 - 15  Protime-INR     Status: None   Collection Time: 03/26/17  7:38 PM  Result Value Ref Range   Prothrombin Time 13.2 11.4 - 15.2 seconds  INR 1.00    Dg Tibia/fibula Right Port  Result Date: 03/26/2017 CLINICAL DATA:  Ankle pain after fall 12 feet. EXAM: PORTABLE RIGHT TIBIA AND FIBULA - 2 VIEW COMPARISON:  None. FINDINGS: There is an acute, closed, bimalleolar and likely trimalleolar fracture-dislocation at the ankle joint with the talar dome laterally dislocated relative to the tibial plafond as well as 90 degree laterally rotated. No dislocation of the knee joint. Calcaneal enthesophytes are present along the plantar dorsal aspect. No apparent calcaneal fracture. Midfoot articulations and subtalar joints are grossly intact. There is an accessory ossicle adjacent to cuboid. IMPRESSION: There is an acute, closed, bimalleolar and likely trimalleolar fracture-dislocation at the ankle joint with the talar dome laterally dislocated relative to the tibial plafond as well as 90 degree externally rotated. Electronically Signed   By: Ashley Royalty M.D.   On: 03/26/2017 19:59   Dg Ankle Right Port  Result Date: 03/26/2017 CLINICAL DATA:  Acute right ankle pain after 12 foot fall from a wall. EXAM: PORTABLE RIGHT  ANKLE - 2 VIEW COMPARISON:  None. FINDINGS: There is an acute, closed, bimalleolar and likely trimalleolar fracture-dislocation at the ankle joint with the talar dome laterally dislocated relative to the tibial plafond as well as 90 degree laterally rotated. There is a transverse fracture through the medial malleolus with an oblique fracture through the distal diaphysis of the fibula. An adjacent ossific density may represent a posterior malleolar fracture fragment. IMPRESSION: There is an acute, closed, bimalleolar and likely trimalleolar fracture-dislocation at the ankle joint with the talar dome laterally dislocated relative to the tibial plafond as well as 90 degree laterally rotated. Electronically Signed   By: Ashley Royalty M.D.   On: 03/26/2017 19:57    Review of Systems  All other systems reviewed and are negative.   Blood pressure 129/72, pulse 85, temperature 98 F (36.7 C), temperature source Temporal, resp. rate 14, weight 180 lb (81.6 kg), SpO2 90 %. Physical Exam  Constitutional: He is oriented to person, place, and time. He appears well-developed and well-nourished.  HENT:  Head: Normocephalic and atraumatic.  Eyes: Pupils are equal, round, and reactive to light.  Neck: Normal range of motion. Neck supple.  Cardiovascular: Normal rate and regular rhythm.   Respiratory: Effort normal and breath sounds normal.  GI: Soft. Bowel sounds are normal.  Musculoskeletal:       Right ankle: He exhibits decreased range of motion, deformity and laceration. Tenderness. Lateral malleolus and medial malleolus tenderness found.       Feet:  Neurological: He is alert and oriented to person, place, and time.  Skin: Skin is warm and dry.  Psychiatric: He has a normal mood and affect.     Assessment/Plan Right open ankle fracture/dislocation 1)  I was able to reduce the dislocation in the ED and splint the fracture.  I spoke with the patient and his wife about the urgent need for surgery given  the open fracture and ankle instability.  Will proceed to surgery this evening for irrigation/debridement and likely external fixation.  Risks and benefits discussed in detail and informed consent is obtained.  Mcarthur Rossetti, MD 03/26/2017, 8:34 PM

## 2017-03-26 NOTE — Brief Op Note (Signed)
03/26/2017  10:53 PM  PATIENT:  Austin Dillon  66 y.o. male  PRE-OPERATIVE DIAGNOSIS:  Right Ankle Fracture  POST-OPERATIVE DIAGNOSIS:  Right Ankle Fracture  PROCEDURE:  Procedure(s): EXTERNAL FIXATION RIGHT LEG (Right)  SURGEON:  Surgeon(s) and Role:    * Mcarthur Rossetti, MD - Primary  ANESTHESIA:   general  EBL:  Total I/O In: 850 [I.V.:850] Out: 50 [Blood:50]  COUNTS:  YES  TOURNIQUET:  * No tourniquets in log *  DICTATION: .Other Dictation: Dictation Number [id number]  PLAN OF CARE: Admit to inpatient   PATIENT DISPOSITION:  PACU - hemodynamically stable.   Delay start of Pharmacological VTE agent (>24hrs) due to surgical blood loss or risk of bleeding: no

## 2017-03-26 NOTE — Transfer of Care (Signed)
Immediate Anesthesia Transfer of Care Note  Patient: Austin Dillon  Procedure(s) Performed: Procedure(s): EXTERNAL FIXATION RIGHT LEG (Right)  Patient Location: PACU  Anesthesia Type:General  Level of Consciousness: sedated and patient cooperative  Airway & Oxygen Therapy: Patient Spontanous Breathing and Patient connected to face mask oxygen  Post-op Assessment: Report given to RN and Post -op Vital signs reviewed and stable  Post vital signs: Reviewed and stable  Last Vitals:  Vitals:   03/26/17 2045 03/26/17 2248  BP: (!) 154/80 (P) 135/75  Pulse: 84 (P) 74  Resp: 20 (P) 12  Temp:  (P) 36.5 C    Last Pain:  Vitals:   03/26/17 2049  TempSrc:   PainSc: 0-No pain         Complications: No apparent anesthesia complications

## 2017-03-26 NOTE — Progress Notes (Signed)
Orthopedic Tech Progress Note Patient Details:  Austin Dillon 05/06/1951 [id number]  Ortho Devices Type of Ortho Device: Ace wrap, Stirrup splint Ortho Device/Splint Interventions: Application   [place] Pink 03/26/2017, 7:45 PM

## 2017-03-26 NOTE — ED Notes (Signed)
Pt. transported to OR holding Rm. 36.

## 2017-03-26 NOTE — ED Triage Notes (Signed)
Pt arrives by [facility] after falling from a estimated 52ft Wall that he was walking on. Pt denies hitting head. Pt alert and ox4. Pt arrives to ED with his right boot still on but an obvious injury to right ankle. Ems gave 250 MG fentanyl en route.

## 2017-03-26 NOTE — Anesthesia Procedure Notes (Signed)
Procedure Name: Intubation Date/Time: 03/26/2017 9:38 PM Performed by: Myna Bright Pre-anesthesia Checklist: Patient identified, Emergency Drugs available, Suction available and Patient being monitored Patient Re-evaluated:Patient Re-evaluated prior to inductionOxygen Delivery Method: Circle system utilized Preoxygenation: Pre-oxygenation with 100% oxygen Intubation Type: IV induction Ventilation: Mask ventilation without difficulty Laryngoscope Size: Mac and 4 Grade View: Grade II Tube type: Oral Tube size: 7.5 mm Number of attempts: 1 Airway Equipment and Method: Stylet Placement Confirmation: ETT inserted through vocal cords under direct vision,  positive ETCO2 and breath sounds checked- equal and bilateral Secured at: 22 cm Tube secured with: Tape Dental Injury: Teeth and Oropharynx as per pre-operative assessment

## 2017-03-27 ENCOUNTER — Encounter (HOSPITAL_COMMUNITY): Payer: Self-pay | Admitting: Orthopaedic Surgery

## 2017-03-27 MED ORDER — CEFAZOLIN IN D5W 1 GM/50ML IV SOLN
1.0000 g | Freq: Three times a day (TID) | INTRAVENOUS | Status: DC
  Administered 2017-03-27 – 2017-03-31 (×11): 1 g via INTRAVENOUS
  Filled 2017-03-27 (×15): qty 50

## 2017-03-27 MED ORDER — KETOROLAC TROMETHAMINE 30 MG/ML IJ SOLN
30.0000 mg | Freq: Once | INTRAMUSCULAR | Status: AC
  Administered 2017-03-27: 30 mg via INTRAVENOUS
  Filled 2017-03-27: qty 1

## 2017-03-27 NOTE — Evaluation (Signed)
Occupational Therapy Evaluation Patient Details Name: Austin Dillon MRN: [id number] DOB: [birth year] Today's Date: 03/27/2017    History of Present Illness  66 yo male who accidentally fell off of a wall onto his right ankle causing a severe deformity.  Was brought to the ED with a right open ankle fracture/dislocation.  Pt is a smoker and wife reports he has Alzheimer's dementia, no other medical history on file.  pt s/p external fixator 03/26/17   Clinical Impression   Patient is s/p external fixator 4/ 23/18 R trimalleolar ankle fx surgery resulting in functional limitations due to the deficits listed below (see OT problem list). PTA was independent with adls. Pt was spraying round up on weeds and fell off the wall,.  Patient will benefit from skilled OT acutely to increase independence and safety with ADLS to allow discharge [place].     Follow Up Recommendations  Home health OT    Equipment Recommendations  3 in 1 bedside commode;Other (comment) (RW)    Recommendations for Other Services       Precautions / Restrictions Precautions Precautions: Fall Precaution Comments: external fixator elevation Restrictions Weight Bearing Restrictions: Yes RLE Weight Bearing: Non weight bearing      Mobility Bed Mobility Overal bed mobility: Modified Independent             General bed mobility comments: pt able to elevate bil LE into bed and with bed tilted able to pull himself up with head board  Transfers Overall transfer level: Needs assistance Equipment used: Rolling walker (2 wheeled) Transfers: Sit to/from Stand Sit to Stand: Mod assist         General transfer comment: pt requires (A) for R LE to maintain NWB but able to power up    Balance Overall balance assessment: Needs assistance;History of Falls Sitting-balance support: No upper extremity supported Sitting balance-Leahy Scale: Good     Standing balance support: Bilateral upper extremity  supported Standing balance-Leahy Scale: Fair Standing balance comment: unsafe standing without UE support                           ADL either performed or assessed with clinical judgement   ADL Overall ADL's : Needs assistance/impaired Eating/Feeding: Supervision/ safety;Sitting   Grooming: Wash/dry hands;Wash/dry face;Supervision/safety   Upper Body Bathing: Supervision/ safety;Sitting   Lower Body Bathing: Maximal assistance           Toilet Transfer: Moderate assistance;RW Toilet Transfer Details (indicate cue type and reason): pt required (A) to maintain NWB R LE           General ADL Comments: Pt restless in chair and showing discomfort. Pt with cognitive deficits and family leaving. per wife pt needs supervision. wife reports he tried to get up once already. pt with bed alarm set and positioned in bed in safe position. RN notified     Vision         Perception     Praxis      Pertinent Vitals/Pain Pain Assessment: Faces Faces Pain Scale: Hurts even more Pain Location: RLE Pain Descriptors / Indicators: Aching;Constant;Grimacing Pain Intervention(s): Monitored during session;Premedicated before session;Repositioned;RN gave pain meds during session     Hand Dominance Right   Extremity/Trunk Assessment Upper Extremity Assessment Upper Extremity Assessment: Overall WFL for tasks assessed   Lower Extremity Assessment Lower Extremity Assessment: Defer to PT evaluation RLE Deficits / Details: hip and knee WFL, ankle with external fixation   Cervical /  Trunk Assessment Cervical / Trunk Assessment: Normal   Communication Communication Communication: No difficulties   Cognition Arousal/Alertness: Awake/alert Behavior During Therapy: Impulsive;Restless Overall Cognitive Status: History of cognitive impairments - at baseline                                 General Comments: pt expressed he could get back in the bed by himself when  questioned even after education to avoid movement without staff. Pt needed (A) to keep R LE elevated. pt once in the bed unable to verbalize how to contact RN. pt provided remote and states "where is the remote" Pt unable to locate in bed on the R side   General Comments  wife does not want pt to go to a facility because of increased confusion out of his home environment. Prefers home with [facility]    Exercises     Shoulder Instructions      Home Living Family/patient expects to be discharged to:: Private residence Living Arrangements: Spouse/significant other Available Help at Discharge: Family;Available 24 hours/day Type of Home: House Home Access: Stairs to enter [employer] of Steps: 4 Entrance Stairs-Rails: Right;Left;Can reach both Home Layout: One level;Laundry or work area in basement     [employer] Shower/Tub: Teacher, early years/pre: Standard     Home Equipment: None   Additional Comments: wife reports that he needs supervision      Prior Functioning/Environment Level of Independence: Independent                 OT Problem List: Decreased strength;Decreased activity tolerance;Impaired balance (sitting and/or standing);Decreased safety awareness;Decreased knowledge of use of DME or AE;Decreased knowledge of precautions;Pain;Decreased cognition      OT Treatment/Interventions: Self-care/ADL training;Therapeutic exercise;DME and/or AE instruction;Therapeutic activities;Cognitive remediation/compensation;Patient/family education;Balance training    OT Goals(Current goals can be found in the care plan section) Acute Rehab OT Goals Patient Stated Goal: return home OT Goal Formulation: With patient Time For Goal Achievement: 04/10/17 Potential to Achieve Goals: Good  OT Frequency: Min 2X/week   Barriers to D/C:            Co-evaluation              End of Session Equipment Utilized During Treatment: Gait belt;Rolling walker Nurse  Communication: Mobility status;Precautions;Weight bearing status  Activity Tolerance: Patient tolerated treatment well Patient left: in bed;with call bell/phone within reach;with bed alarm set;with family/visitor present  OT Visit Diagnosis: Unsteadiness on feet (R26.81)                Time: 3329-5188 OT Time Calculation (min): 18 min Charges:  OT General Charges $OT Visit: 1 Procedure OT Evaluation $OT Eval Moderate Complexity: 1 Procedure G-Codes:      Jeri Modena   OTR/L Pager: [phone] Office: [phone] .   Parke Poisson B 03/27/2017, 1:19 PM

## 2017-03-27 NOTE — Progress Notes (Signed)
Offered Pt a bath. Pt declined at this time.

## 2017-03-27 NOTE — Anesthesia Postprocedure Evaluation (Signed)
Anesthesia Post Note  Patient: Austin Dillon  Procedure(s) Performed: Procedure(s) (LRB): EXTERNAL FIXATION RIGHT LEG (Right)  Patient location during evaluation: PACU Anesthesia Type: General Level of consciousness: awake and alert Pain management: pain level controlled Vital Signs Assessment: post-procedure vital signs reviewed and stable Respiratory status: spontaneous breathing, nonlabored ventilation, respiratory function stable and patient connected to nasal cannula oxygen Cardiovascular status: blood pressure returned to baseline and stable Postop Assessment: no signs of nausea or vomiting Anesthetic complications: no       Last Vitals:  Vitals:   03/26/17 2336 03/27/17 0002  BP: (!) 150/82 (!) 141/73  Pulse: 77 82  Resp: 12 16  Temp: 36.7 C 36.8 C    Last Pain:  Vitals:   03/27/17 0615  TempSrc:   PainSc: 10-Worst pain ever                 Tiajuana Amass

## 2017-03-27 NOTE — Progress Notes (Signed)
Patient ID: Austin Dillon, male   DOB: December 29, 1950, 66 y.o.   MRN: [id number] External fixation on right ankle intact.  Right foot well-perfused.  Can move his toes.  I spoke with Dr. Sharol Given about assessing the ankle further.  The plan will be for a return to the OR this Wednesday due to the extensive nature of his injury.  Will continue IV antibiotics as well.

## 2017-03-27 NOTE — Progress Notes (Signed)
Physical Therapy Treatment Patient Details Name: Austin Dillon MRN: [id number] DOB: 1951/04/25 Today's Date: 03/27/2017    History of Present Illness  66 yo male who accidentally fell off of a wall onto his right ankle causing a severe deformity.  Was brought to the ED with a right open ankle fracture/dislocation.  Pt is a smoker and wife reports he has Alzheimer's dementia, no other medical history on file.     PT Comments    Pt moving well with external fixator but wife reports a h/o Alzheimers and pt was very impulsive with decreased ability to follow vc's and decreased safety awareness. Ambulated to bathroom and back with RW and min A with the ability to keep RLE NWB but needs constant reminders to do so. PT will continue to follow.    Follow Up Recommendations  Home health PT     Equipment Recommendations  Rolling walker with 5" wheels    Recommendations for Other Services       Precautions / Restrictions Precautions Precautions: Fall Restrictions Weight Bearing Restrictions: Yes RLE Weight Bearing: Non weight bearing    Mobility  Bed Mobility Overal bed mobility: Modified Independent             General bed mobility comments: pt able to lift RLE and move it on and off bed  Transfers Overall transfer level: Needs assistance Equipment used: Rolling walker (2 wheeled) Transfers: Sit to/from Stand Sit to Stand: Min assist         General transfer comment: min A from bed and toilet for safety because pt moving very quickly and kept wanting to put R foot on floor. vc's for hand placement  Ambulation/Gait Ambulation/Gait assistance: Min assist Ambulation Distance (Feet): 10 Feet Assistive device: Rolling walker (2 wheeled) Gait Pattern/deviations: Step-to pattern     General Gait Details: pt able to keep RLE NWB but constant reminders to do so as well as to attend to IV. Kept taking very short fast hops and pushing RW too far fwd. vc's for slowing down and  taking fewer mid size hops   Stairs            Wheelchair Mobility    Modified Rankin (Stroke Patients Only)       Balance Overall balance assessment: Needs assistance;History of Falls Sitting-balance support: No upper extremity supported Sitting balance-Leahy Scale: Good     Standing balance support: Bilateral upper extremity supported Standing balance-Leahy Scale: Poor Standing balance comment: unsafe standing without UE support                            Cognition Arousal/Alertness: Awake/alert Behavior During Therapy: Impulsive;Restless Overall Cognitive Status: History of cognitive impairments - at baseline                                 General Comments: pt with minimal verbalization throughout session. VERY impulsive with all activity, trying to crawl out of bed over rail despite therapist asking him to wait. Needed constant cueing for RLE NWB      Exercises      General Comments General comments (skin integrity, edema, etc.): wife does not want pt to go to a facility because of increased confusion out of his home environment. Prefers home with HHPT      Pertinent Vitals/Pain Pain Assessment: Faces Faces Pain Scale: Hurts even more Pain Location: RLE Pain  Descriptors / Indicators: Aching Pain Intervention(s): Limited activity within patient's tolerance;Monitored during session;Repositioned         O2 sats 91% on RA  Home Living Family/patient expects to be discharged to:: Private residence Living Arrangements: Spouse/significant other Available Help at Discharge: Family;Available 24 hours/day Type of Home: House Home Access: Stairs to enter Entrance Stairs-Rails: Right;Left;Can reach both Home Layout: One level;Laundry or work area in [employer]: None Additional Comments: wife reports that he needs supervision    Prior Function Level of Independence: Independent          PT Goals (current goals can now be  found in the care plan section) Acute Rehab PT Goals Patient Stated Goal: return home PT Goal Formulation: With patient/family Time For Goal Achievement: 04/10/17 Potential to Achieve Goals: Good    Frequency    Min 5X/week      PT Plan      Co-evaluation             End of Session Equipment Utilized During Treatment: Gait belt Activity Tolerance: Patient tolerated treatment well Patient left: in chair;with call bell/phone within reach;with chair alarm set;with family/visitor present Nurse Communication: Mobility status;Other (comment) (impulsivity) PT Visit Diagnosis: Unsteadiness on feet (R26.81);Pain Pain - Right/Left: Right Pain - part of body: Ankle and joints of foot     Time: 1021-1055 PT Time Calculation (min) (ACUTE ONLY): 34 min  Charges:  $Gait Training: 8-22 mins                    G Codes:       Leighton Roach, PT  Acute Rehab Services  [place] 03/27/2017, 12:29 PM

## 2017-03-28 ENCOUNTER — Encounter (HOSPITAL_COMMUNITY): Payer: Self-pay | Admitting: General Practice

## 2017-03-28 ENCOUNTER — Ambulatory Visit: Payer: Medicare Other

## 2017-03-28 LAB — CBC
HEMATOCRIT: 35.1 % — AB (ref 39.0–52.0)
Hemoglobin: 11.4 g/dL — ABNORMAL LOW (ref 13.0–17.0)
MCH: 32.9 pg (ref 26.0–34.0)
MCHC: 32.5 g/dL (ref 30.0–36.0)
MCV: 101.2 fL — AB (ref 78.0–100.0)
Platelets: 132 10*3/uL — ABNORMAL LOW (ref 150–400)
RBC: 3.47 MIL/uL — AB (ref 4.22–5.81)
RDW: 12.4 % (ref 11.5–15.5)
WBC: 8 10*3/uL (ref 4.0–10.5)

## 2017-03-28 MED ORDER — KETOROLAC TROMETHAMINE 15 MG/ML IJ SOLN
15.0000 mg | Freq: Three times a day (TID) | INTRAMUSCULAR | Status: DC | PRN
  Administered 2017-03-28: 15 mg via INTRAVENOUS
  Filled 2017-03-28: qty 1

## 2017-03-28 NOTE — Progress Notes (Signed)
Tech offered Pt a bath. Pt stated no thank you and wife also stated that she had already given Pt a good wipe off. Tech changed bed linen.

## 2017-03-28 NOTE — Progress Notes (Signed)
Patient ID: Austin Dillon, male   DOB: Nov 16, 1951, 66 y.o.   MRN: [id number] No acute changes.  Dr. Sharol Given saw the patient this am and plans a return to surgery tomorrow.

## 2017-03-28 NOTE — Consult Note (Signed)
ORTHOPAEDIC CONSULTATION  REQUESTING PHYSICIAN: Mcarthur Rossetti, *  Chief Complaint: Open fracture dislocation bimalleolar right ankle  HPI: Austin Dillon is a 66 y.o. male who presents with open fracture dislocation right ankle. Patient states that he sustained the injury in the outdoors had to crawl back in. Patient sustained multiple abrasions and contusions to his forearms.  History reviewed. No pertinent past medical history. Past Surgical History:  Procedure Laterality Date  . EXTERNAL FIXATION LEG Right 03/26/2017   Procedure: EXTERNAL FIXATION RIGHT LEG;  Surgeon: Mcarthur Rossetti, MD;  Location: [place];  Service: Orthopedics;  Laterality: Right;   Social History   Social History  . Marital status: Married    Spouse name: N/A  . Number of children: N/A  . Years of education: N/A   Social History Main Topics  . Smoking status: Current Every Day Smoker    Packs/day: 1.00    Years: 25.00    Types: Cigarettes  . Smokeless tobacco: None  . Alcohol use None  . Drug use: Unknown  . Sexual activity: Not Asked   Other Topics Concern  . None   Social History Narrative  . None   No family history on file. - negative except otherwise stated in the family history section Allergies  Allergen Reactions  . Nortriptyline Other (See Comments)    Severe mood swings   Prior to Admission medications   Medication Sig Start Date End Date Taking? Authorizing Provider  alprazolam Duanne Moron) 2 MG tablet Take 2 mg by mouth 3 (three) times daily as needed for anxiety.   Yes Historical Provider, MD  aspirin EC 81 MG tablet Take 81 mg by mouth at bedtime.    Yes Historical Provider, MD  butalbital-acetaminophen-caffeine (FIORICET, ESGIC) 50-325-40 MG tablet Take 1 tablet by mouth daily as needed for headache.   Yes Historical Provider, MD  Cholecalciferol (VITAMIN D-3 PO) Take 1 capsule by mouth at bedtime.   Yes Historical Provider, MD  DULoxetine (CYMBALTA) 20 MG capsule  Take 20 mg by mouth daily. 08/29/16  Yes Historical Provider, MD  lamoTRIgine (LAMICTAL) 100 MG tablet Take 100-150 mg by mouth See admin instructions. 100 mg in the morning and 150 mg in the evening   Yes Historical Provider, MD  ALPRAZolam (XANAX) 0.5 MG tablet Take 1 tablet (0.5 mg total) by mouth 3 (three) times daily as needed for anxiety. Patient not taking: Reported on 03/26/2017 04/26/13   Orlena Sheldon, PA-C  escitalopram (LEXAPRO) 10 MG tablet Take 1 tablet (10 mg total) by mouth daily. Patient not taking: Reported on 03/26/2017 04/26/13   Orlena Sheldon, PA-C  meloxicam (MOBIC) 7.5 MG tablet Take 1 tablet (7.5 mg total) by mouth daily. Patient not taking: Reported on 03/26/2017 04/26/13   Orlena Sheldon, PA-C  metaxalone (SKELAXIN) 800 MG tablet Take 1 tablet (800 mg total) by mouth 4 (four) times daily. Patient not taking: Reported on 03/26/2017 04/26/13   Orlena Sheldon, PA-C   Dg Ankle 2 Views Right  Result Date: 03/27/2017 CLINICAL DATA:  External fixation of right ankle fracture EXAM: DG C-ARM 61-120 MIN; RIGHT ANKLE - 2 VIEW COMPARISON:  Preoperative study from 03/26/2017 FINDINGS: A total of 41 seconds was utilized for external fixation reduction of trimalleolar fracture dislocation of the right ankle. Alignment is improved and closer to anatomic. IMPRESSION: External fixation and reduction of trimalleolar fracture-dislocation of right ankle. Electronically Signed   By: Ashley Royalty M.D.   On: 03/27/2017 00:21  Dg Tibia/fibula Right Port  Result Date: 03/26/2017 CLINICAL DATA:  Ankle pain after fall 12 feet. EXAM: PORTABLE RIGHT TIBIA AND FIBULA - 2 VIEW COMPARISON:  None. FINDINGS: There is an acute, closed, bimalleolar and likely trimalleolar fracture-dislocation at the ankle joint with the talar dome laterally dislocated relative to the tibial plafond as well as 90 degree laterally rotated. No dislocation of the knee joint. Calcaneal enthesophytes are present along the plantar dorsal aspect. No  apparent calcaneal fracture. Midfoot articulations and subtalar joints are grossly intact. There is an accessory ossicle adjacent to cuboid. IMPRESSION: There is an acute, closed, bimalleolar and likely trimalleolar fracture-dislocation at the ankle joint with the talar dome laterally dislocated relative to the tibial plafond as well as 90 degree externally rotated. Electronically Signed   By: Ashley Royalty M.D.   On: 03/26/2017 19:59   Dg Ankle Right Port  Result Date: 03/26/2017 CLINICAL DATA:  Acute right ankle pain after 12 foot fall from a wall. EXAM: PORTABLE RIGHT ANKLE - 2 VIEW COMPARISON:  None. FINDINGS: There is an acute, closed, bimalleolar and likely trimalleolar fracture-dislocation at the ankle joint with the talar dome laterally dislocated relative to the tibial plafond as well as 90 degree laterally rotated. There is a transverse fracture through the medial malleolus with an oblique fracture through the distal diaphysis of the fibula. An adjacent ossific density may represent a posterior malleolar fracture fragment. IMPRESSION: There is an acute, closed, bimalleolar and likely trimalleolar fracture-dislocation at the ankle joint with the talar dome laterally dislocated relative to the tibial plafond as well as 90 degree laterally rotated. Electronically Signed   By: Ashley Royalty M.D.   On: 03/26/2017 19:57   Dg C-arm 1-60 Min  Result Date: 03/27/2017 CLINICAL DATA:  External fixation of right ankle fracture EXAM: DG C-ARM 61-120 MIN; RIGHT ANKLE - 2 VIEW COMPARISON:  Preoperative study from 03/26/2017 FINDINGS: A total of 41 seconds was utilized for external fixation reduction of trimalleolar fracture dislocation of the right ankle. Alignment is improved and closer to anatomic. IMPRESSION: External fixation and reduction of trimalleolar fracture-dislocation of right ankle. Electronically Signed   By: Ashley Royalty M.D.   On: 03/27/2017 00:21   - pertinent xrays, CT, MRI studies were reviewed  and independently interpreted  Positive ROS: All other systems have been reviewed and were otherwise negative with the exception of those mentioned in the HPI and as above.  Physical Exam: General: Alert, no acute distress Psychiatric: Patient is competent for consent with normal mood and affect Lymphatic: No axillary or cervical lymphadenopathy Cardiovascular: No pedal edema Respiratory: No cyanosis, no use of accessory musculature GI: No organomegaly, abdomen is soft and non-tender  Skin: Patient has bruises and scrapes cuts to both forearms. The right lower extremity is wrapped. He can wiggle his toes well he has good capillary refill.   Neurologic: Patient does not have protective sensation bilateral lower extremities.   MUSCULOSKELETAL:  Radiographs show a displaced lateral malleolar fracture as well as a displaced medial malleolar fracture. The external fixator has stabilized the alignment of his ankle.  Assessment: Assessment: Open type IIIB bimalleolar right ankle fracture.  Plan: Plan: We'll return to the operating room tomorrow Wednesday for repeat irrigation and debridement and internal fixation of the fibula and medial malleolus. Risks and benefits were discussed including infection neurovascular injury traumatic arthritis nonhealing of the wounds need for additional surgery. Patient states he understands wish to proceed at this time.  Discussed the importance of smoking  cessation. Discussed that if patient does not stop smoking he is at increased risk of the wounds not healing and increased risk for potential amputation. Patient and his wife state they understand. Continue IV Kefzol. Order placed for IV Toradol.  Thank you for the consult and the opportunity to see Austin Dillon, [place] [phone] 6:36 AM

## 2017-03-28 NOTE — Progress Notes (Signed)
Occupational Therapy Treatment Patient Details Name: Austin Dillon MRN: [id number] DOB: [birth year] Today's Date: 03/28/2017    History of present illness  66 yo male who accidentally fell off of a wall onto his right ankle causing a severe deformity.  Was brought to the ED with a right open ankle fracture/dislocation.  Pt is a smoker and wife reports he has Alzheimer's dementia, no other medical history on file.  pt s/p external fixator 03/26/17   OT comments  Question need for higher level care than d/c home with wife based on session. Pt demonstrates basic transfer with impulsive behavior. Pt needs constant cues. Pt need max cues to locate call bell in lap. Pt pleasant and eager to participate.   Follow Up Recommendations  Home health OT    Equipment Recommendations  3 in 1 bedside commode;Other (comment)    Recommendations for Other Services      Precautions / Restrictions Precautions Precautions: Fall Precaution Comments: external fixator elevation Restrictions RLE Weight Bearing: Non weight bearing       Mobility Bed Mobility Overal bed mobility: Modified Independent             General bed mobility comments: however impuslive and not aware of R LE external fixator. pt placing fixator against side of bed and needs cues for safety  Transfers Overall transfer level: Needs assistance Equipment used: Rolling walker (2 wheeled) Transfers: Sit to/from Stand Sit to Stand: Min assist         General transfer comment: v/c's for hand placement and safety, able to maintain R LE NWB    Balance Overall balance assessment: Needs assistance Sitting-balance support: No upper extremity supported Sitting balance-Leahy Scale: Good     Standing balance support: Bilateral upper extremity supported;During functional activity Standing balance-Leahy Scale: Poor                             ADL either performed or assessed with clinical judgement   ADL Overall  ADL's : Needs assistance/impaired Eating/Feeding: Set up                                     General ADL Comments: pt in a wet bed on arrival . pt noted to have strong urine odor in the room. pt declined (A) with LB adl. Question if pt spilled urinal. pt (A) oob to chair for lunch. Pt needed cues for safety and impulsive movements. wife attempting to enter room 22 instead of 21. Wife needed guidance from OT to correct room. wife states "oh how did you find that so fast"      Vision       Perception     Praxis      Cognition Arousal/Alertness: Awake/alert Behavior During Therapy: Impulsive Overall Cognitive Status: History of cognitive impairments - at baseline                                          Exercises     Shoulder Instructions       General Comments wife demonstrates deficits with locating patients room. Question wifes ability to provide required level of care    Pertinent Vitals/ Pain       Pain Assessment: No/denies pain  Home Living Family/patient expects to  be discharged to:: Unsure Living Arrangements: Spouse/significant other                                      Prior Functioning/Environment              Frequency  Min 2X/week        Progress Toward Goals  OT Goals(current goals can now be found in the care plan section)  Progress towards OT goals: Progressing toward goals  Acute Rehab OT Goals Patient Stated Goal: go to surgery tomorrow OT Goal Formulation: With patient Time For Goal Achievement: 04/10/17 Potential to Achieve Goals: Good ADL Goals Pt Will Perform Grooming: with set-up;sitting Pt Will Perform Upper Body Bathing: with set-up;sitting Pt Will Transfer to Toilet: with supervision;stand pivot transfer;bedside commode Additional ADL Goal #1: Pt will demonstrate LB dressing with Min (A) from caregiver   Plan Discharge plan remains appropriate    Co-evaluation                  End of Session Equipment Utilized During Treatment: Rolling walker;Gait belt  OT Visit Diagnosis: Unsteadiness on feet (R26.81)   Activity Tolerance Patient tolerated treatment well   Patient Left in chair;with call bell/phone within reach;with chair alarm set;with family/visitor present   Nurse Communication Mobility status;Precautions;Weight bearing status        Time: 1050-1120 OT Time Calculation (min): 30 min  Charges: OT General Charges $OT Visit: 1 Procedure OT Treatments $Therapeutic Activity: 8-22 mins   Jeri Modena   OTR/L Pager: [phone] Office: [phone] .    Parke Poisson B 03/28/2017, 2:09 PM

## 2017-03-28 NOTE — Progress Notes (Signed)
Physical Therapy Treatment Patient Details Name: Austin Dillon MRN: [id number] DOB: 1951-01-31 Today's Date: 03/28/2017    History of Present Illness  66 yo male who accidentally fell off of a wall onto his right ankle causing a severe deformity.  Was brought to the ED with a right open ankle fracture/dislocation.  Pt is a smoker and wife reports he has Alzheimer's dementia, no other medical history on file.  pt s/p external fixator 03/26/17    PT Comments    Pt remains impulsive but significantly better today than yesterday however remains to have decreased safety awareness and requires constant supervision to maintain safety. Wife very upset and distraught this morning. Strongly desires for pt to return home due to pt experiencing increased confusion in environments outside home but unsure if wife can care for patient safely both mentally and physically. Per notes pt is planned to go back to the OR tomorrow 4/25 and PT with re-assess pts mobility as appropriate post surgery. Pt may need ST-SNF if pt can not function safely at the supervision level.    Follow Up Recommendations  Home health PT;Supervision/Assistance - 24 hour     Equipment Recommendations  Rolling walker with 5" wheels    Recommendations for Other Services       Precautions / Restrictions Precautions Precautions: Fall Precaution Comments: external fixator elevation Restrictions Weight Bearing Restrictions: Yes RLE Weight Bearing: Non weight bearing    Mobility  Bed Mobility Overal bed mobility: Modified Independent             General bed mobility comments: used long sit technique and was able to manage R LE without assist  Transfers Overall transfer level: Needs assistance Equipment used: Rolling walker (2 wheeled) Transfers: Sit to/from Stand Sit to Stand: Min assist         General transfer comment: v/c's for hand placement and safety, able to maintain R LE  NWB  Ambulation/Gait Ambulation/Gait assistance: Min assist Ambulation Distance (Feet): 50 Feet Assistive device: Rolling walker (2 wheeled) Gait Pattern/deviations: Step-to pattern Gait velocity: v/c's for slow more efficient hops instead of quick shuffled hops with decreased foot clearance   General Gait Details: pt able to maintain R LE NWB. only able to clear L foot during hopping 50% of time, other time more of a shuffle   Stairs Stairs: Yes   Stair Management: Two rails;Step to pattern;Backwards Number of Stairs: 2 General stair comments: pt able to follow instructions but required max v/c's to keep R foot out in front to minimize hitting step  Wheelchair Mobility    Modified Rankin (Stroke Patients Only)       Balance Overall balance assessment: Needs assistance;History of Falls Sitting-balance support: No upper extremity supported Sitting balance-Leahy Scale: Good     Standing balance support: Bilateral upper extremity supported Standing balance-Leahy Scale: Poor Standing balance comment: unsafe standing without UE support, needs RW                            Cognition Arousal/Alertness: Awake/alert Behavior During Therapy: Impulsive Overall Cognitive Status: History of cognitive impairments - at baseline                                 General Comments: per previous notes, wife reports Alzheimers, however today pt able to recall R LE NWB and maintain that, stated date, place and situation. aware he's  going for surgery tomorrow. Responded well to PT when cues to slow down for safety/less impulsive      Exercises      General Comments        Pertinent Vitals/Pain Pain Assessment: 0-10 Pain Score: 8  Pain Location: R LE Pain Descriptors / Indicators: Aching;Constant;Grimacing Pain Intervention(s): Monitored during session    Home Living                      Prior Function            PT Goals (current goals can  now be found in the care plan section) Acute Rehab PT Goals Patient Stated Goal: go to surgery tomorrow Progress towards PT goals: Progressing toward goals    Frequency    Min 5X/week      PT Plan Current plan remains appropriate    Co-evaluation             End of Session Equipment Utilized During Treatment: Gait belt Activity Tolerance: Patient tolerated treatment well Patient left: in chair;with call bell/phone within reach;with chair alarm set Nurse Communication: Mobility status;Other (comment) PT Visit Diagnosis: Unsteadiness on feet (R26.81);Pain Pain - Right/Left: Right Pain - part of body: Ankle and joints of foot     Time: 0752-0827 PT Time Calculation (min) (ACUTE ONLY): 35 min  Charges:  $Gait Training: 23-37 mins                    G Codes:       Kittie Plater, PT, DPT Pager #: [phone] Office #: [phone]   [place] 03/28/2017, 8:51 AM

## 2017-03-29 ENCOUNTER — Encounter (HOSPITAL_COMMUNITY)
Admission: EM | Disposition: A | Discharge status: Skilled Nursing Facility | Payer: Self-pay | Source: Home / Self Care | Attending: Orthopaedic Surgery

## 2017-03-29 ENCOUNTER — Other Ambulatory Visit (INDEPENDENT_AMBULATORY_CARE_PROVIDER_SITE_OTHER): Payer: Self-pay | Admitting: Family

## 2017-03-29 ENCOUNTER — Inpatient Hospital Stay (HOSPITAL_COMMUNITY): Payer: Medicare Other | Admitting: Anesthesiology

## 2017-03-29 ENCOUNTER — Encounter (HOSPITAL_COMMUNITY): Payer: Self-pay | Admitting: Certified Registered Nurse Anesthetist

## 2017-03-29 DIAGNOSIS — S82891C Other fracture of right lower leg, initial encounter for open fracture type IIIA, IIIB, or IIIC: Secondary | ICD-10-CM

## 2017-03-29 HISTORY — PX: ORIF ANKLE FRACTURE: SHX5408

## 2017-03-29 SURGERY — OPEN REDUCTION INTERNAL FIXATION (ORIF) ANKLE FRACTURE
Anesthesia: Regional | Site: Ankle | Laterality: Right

## 2017-03-29 MED ORDER — BISACODYL 10 MG RE SUPP: 10.0000 mg | Freq: Every day | RECTAL | Status: DC | PRN

## 2017-03-29 MED ORDER — ONDANSETRON HCL 4 MG/2ML IJ SOLN
INTRAMUSCULAR | Status: AC
  Filled 2017-03-29: qty 4

## 2017-03-29 MED ORDER — METOCLOPRAMIDE HCL 5 MG PO TABS: 5.0000 mg | ORAL_TABLET | Freq: Three times a day (TID) | ORAL | Status: DC | PRN

## 2017-03-29 MED ORDER — MIDAZOLAM HCL 2 MG/2ML IJ SOLN
INTRAMUSCULAR | Status: DC | PRN
  Administered 2017-03-29: 2 mg via INTRAVENOUS

## 2017-03-29 MED ORDER — SODIUM CHLORIDE 0.9 % IV SOLN
INTRAVENOUS | Status: DC
  Administered 2017-03-29: 18:00:00 via INTRAVENOUS

## 2017-03-29 MED ORDER — 0.9 % SODIUM CHLORIDE (POUR BTL) OPTIME
TOPICAL | Status: DC | PRN
  Administered 2017-03-29: 1000 mL

## 2017-03-29 MED ORDER — CEFAZOLIN SODIUM-DEXTROSE 1-4 GM/50ML-% IV SOLN: 1.0000 g | Freq: Four times a day (QID) | INTRAVENOUS | Status: DC

## 2017-03-29 MED ORDER — METHOCARBAMOL 500 MG PO TABS
500.0000 mg | ORAL_TABLET | Freq: Four times a day (QID) | ORAL | Status: DC | PRN
  Administered 2017-03-31: 500 mg via ORAL
  Filled 2017-03-29: qty 1

## 2017-03-29 MED ORDER — METOCLOPRAMIDE HCL 5 MG/ML IJ SOLN: 5.0000 mg | Freq: Three times a day (TID) | INTRAMUSCULAR | Status: DC | PRN

## 2017-03-29 MED ORDER — OXYCODONE HCL 5 MG/5ML PO SOLN: 5.0000 mg | Freq: Once | ORAL | Status: DC | PRN

## 2017-03-29 MED ORDER — FENTANYL CITRATE (PF) 100 MCG/2ML IJ SOLN
25.0000 ug | INTRAMUSCULAR | Status: DC | PRN
  Administered 2017-03-29 (×3): 50 ug via INTRAVENOUS

## 2017-03-29 MED ORDER — KETOROLAC TROMETHAMINE 15 MG/ML IJ SOLN
7.5000 mg | Freq: Four times a day (QID) | INTRAMUSCULAR | Status: AC
  Administered 2017-03-29 – 2017-03-30 (×4): 7.5 mg via INTRAVENOUS
  Filled 2017-03-29 (×4): qty 1

## 2017-03-29 MED ORDER — ONDANSETRON HCL 4 MG/2ML IJ SOLN: 4.0000 mg | Freq: Once | INTRAMUSCULAR | Status: DC | PRN

## 2017-03-29 MED ORDER — DOCUSATE SODIUM 100 MG PO CAPS
100.0000 mg | ORAL_CAPSULE | Freq: Two times a day (BID) | ORAL | Status: DC
  Administered 2017-03-29 – 2017-03-31 (×4): 100 mg via ORAL
  Filled 2017-03-29 (×4): qty 1

## 2017-03-29 MED ORDER — ONDANSETRON HCL 4 MG/2ML IJ SOLN: 4.0000 mg | Freq: Four times a day (QID) | INTRAMUSCULAR | Status: DC | PRN

## 2017-03-29 MED ORDER — METHOCARBAMOL 1000 MG/10ML IJ SOLN
500.0000 mg | Freq: Four times a day (QID) | INTRAVENOUS | Status: DC | PRN
  Filled 2017-03-29: qty 5

## 2017-03-29 MED ORDER — MAGNESIUM CITRATE PO SOLN: 1.0000 | Freq: Once | ORAL | Status: DC | PRN

## 2017-03-29 MED ORDER — CEFAZOLIN SODIUM 1 G IJ SOLR
INTRAMUSCULAR | Status: AC
  Filled 2017-03-29: qty 20

## 2017-03-29 MED ORDER — ACETAMINOPHEN 650 MG RE SUPP: 650.0000 mg | Freq: Four times a day (QID) | RECTAL | Status: DC | PRN

## 2017-03-29 MED ORDER — MIDAZOLAM HCL 2 MG/2ML IJ SOLN
INTRAMUSCULAR | Status: AC
  Filled 2017-03-29: qty 2

## 2017-03-29 MED ORDER — ACETAMINOPHEN 325 MG PO TABS: 650.0000 mg | ORAL_TABLET | Freq: Four times a day (QID) | ORAL | Status: DC | PRN

## 2017-03-29 MED ORDER — LIDOCAINE HCL (CARDIAC) 20 MG/ML IV SOLN
INTRAVENOUS | Status: DC | PRN
  Administered 2017-03-29: 40 mg via INTRATRACHEAL

## 2017-03-29 MED ORDER — CEFAZOLIN SODIUM-DEXTROSE 2-3 GM-% IV SOLR
INTRAVENOUS | Status: DC | PRN
  Administered 2017-03-29: 2 g via INTRAVENOUS

## 2017-03-29 MED ORDER — HYDROMORPHONE HCL 1 MG/ML IJ SOLN
1.0000 mg | INTRAMUSCULAR | Status: DC | PRN
  Administered 2017-03-29 – 2017-03-31 (×5): 1 mg via INTRAVENOUS
  Filled 2017-03-29 (×5): qty 1

## 2017-03-29 MED ORDER — ONDANSETRON HCL 4 MG PO TABS
4.0000 mg | ORAL_TABLET | Freq: Four times a day (QID) | ORAL | Status: DC | PRN
Start: 2017-03-29 — End: 2017-03-31

## 2017-03-29 MED ORDER — OXYCODONE HCL 5 MG PO TABS
5.0000 mg | ORAL_TABLET | ORAL | Status: DC | PRN
  Administered 2017-03-30 – 2017-03-31 (×5): 10 mg via ORAL
  Filled 2017-03-29 (×5): qty 2

## 2017-03-29 MED ORDER — LIDOCAINE 2% (20 MG/ML) 5 ML SYRINGE
INTRAMUSCULAR | Status: AC
Start: 2017-03-29 — End: 2017-03-29
  Filled 2017-03-29: qty 5

## 2017-03-29 MED ORDER — POLYETHYLENE GLYCOL 3350 17 G PO PACK: 17.0000 g | PACK | Freq: Every day | ORAL | Status: DC | PRN

## 2017-03-29 MED ORDER — FENTANYL CITRATE (PF) 250 MCG/5ML IJ SOLN
INTRAMUSCULAR | Status: DC | PRN
  Administered 2017-03-29 (×2): 50 ug via INTRAVENOUS

## 2017-03-29 MED ORDER — LIDOCAINE 2% (20 MG/ML) 5 ML SYRINGE
INTRAMUSCULAR | Status: AC
  Filled 2017-03-29: qty 5

## 2017-03-29 MED ORDER — PROPOFOL 10 MG/ML IV BOLUS
INTRAVENOUS | Status: DC | PRN
  Administered 2017-03-29: 190 mg via INTRAVENOUS

## 2017-03-29 MED ORDER — FENTANYL CITRATE (PF) 100 MCG/2ML IJ SOLN
INTRAMUSCULAR | Status: AC
  Administered 2017-03-29: 50 ug via INTRAVENOUS
  Filled 2017-03-29: qty 2

## 2017-03-29 MED ORDER — OXYCODONE HCL 5 MG PO TABS: 5.0000 mg | ORAL_TABLET | Freq: Once | ORAL | Status: DC | PRN

## 2017-03-29 MED ORDER — LACTATED RINGERS IV SOLN
Freq: Once | INTRAVENOUS | Status: AC
  Administered 2017-03-29: 14:00:00 via INTRAVENOUS

## 2017-03-29 MED ORDER — LACTATED RINGERS IV SOLN
INTRAVENOUS | Status: DC
  Administered 2017-03-29: 13:00:00 via INTRAVENOUS

## 2017-03-29 MED ORDER — FENTANYL CITRATE (PF) 100 MCG/2ML IJ SOLN
INTRAMUSCULAR | Status: AC
  Filled 2017-03-29: qty 2

## 2017-03-29 MED ORDER — FENTANYL CITRATE (PF) 250 MCG/5ML IJ SOLN
INTRAMUSCULAR | Status: AC
  Filled 2017-03-29: qty 5

## 2017-03-29 SURGICAL SUPPLY — 47 items
BANDAGE ESMARK 6X9 LF (GAUZE/BANDAGES/DRESSINGS) IMPLANT
BIT DRILL 2.5X110 QC LCP DISP (BIT) ×1 IMPLANT
BNDG CMPR 9X6 STRL LF SNTH (GAUZE/BANDAGES/DRESSINGS)
BNDG COHESIVE 4X5 TAN STRL (GAUZE/BANDAGES/DRESSINGS) ×2 IMPLANT
BNDG ESMARK 6X9 LF (GAUZE/BANDAGES/DRESSINGS)
BNDG GAUZE ELAST 4 BULKY (GAUZE/BANDAGES/DRESSINGS) ×2 IMPLANT
COVER SURGICAL LIGHT HANDLE (MISCELLANEOUS) ×2 IMPLANT
DRAPE OEC MINIVIEW 54X84 (DRAPES) ×1 IMPLANT
DRAPE U-SHAPE 47X51 STRL (DRAPES) ×2 IMPLANT
DRSG ADAPTIC 3X8 NADH LF (GAUZE/BANDAGES/DRESSINGS) ×2 IMPLANT
DRSG PAD ABDOMINAL 8X10 ST (GAUZE/BANDAGES/DRESSINGS) ×2 IMPLANT
DURAPREP 26ML APPLICATOR (WOUND CARE) ×2 IMPLANT
ELECT REM PT RETURN 9FT ADLT (ELECTROSURGICAL) ×2
ELECTRODE REM PT RTRN 9FT ADLT (ELECTROSURGICAL) ×1 IMPLANT
GAUZE SPONGE 4X4 12PLY STRL (GAUZE/BANDAGES/DRESSINGS) ×2 IMPLANT
GLOVE BIOGEL PI IND STRL 9 (GLOVE) ×1 IMPLANT
GLOVE BIOGEL PI INDICATOR 9 (GLOVE) ×1
GLOVE SURG ORTHO 9.0 STRL STRW (GLOVE) ×2 IMPLANT
GOWN STRL REUS W/ TWL XL LVL3 (GOWN DISPOSABLE) ×3 IMPLANT
GOWN STRL REUS W/TWL XL LVL3 (GOWN DISPOSABLE) ×6
GUIDEWARE NON THREAD 1.25X150 (WIRE) ×4
GUIDEWIRE NON THREAD 1.25X150 (WIRE) IMPLANT
KIT BASIN OR (CUSTOM PROCEDURE TRAY) ×2 IMPLANT
KIT ROOM TURNOVER OR (KITS) ×2 IMPLANT
MANIFOLD NEPTUNE II (INSTRUMENTS) ×2 IMPLANT
NS IRRIG 1000ML POUR BTL (IV SOLUTION) ×2 IMPLANT
PACK ORTHO EXTREMITY (CUSTOM PROCEDURE TRAY) ×2 IMPLANT
PAD ARMBOARD 7.5X6 YLW CONV (MISCELLANEOUS) ×4 IMPLANT
PLATE LCP 3.5 1/3 TUB 8HX93 (Plate) ×1 IMPLANT
PREVENA INCISION MGT 90 150 (MISCELLANEOUS) ×1 IMPLANT
SCREW CANN S THRD/44 4.0 (Screw) ×2 IMPLANT
SCREW CORTEX 3.5 12MM (Screw) ×3 IMPLANT
SCREW CORTEX 3.5 20MM (Screw) ×1 IMPLANT
SCREW LOCK CORT ST 3.5X12 (Screw) IMPLANT
SCREW LOCK CORT ST 3.5X20 (Screw) IMPLANT
SCREW LOCK T15 FT 14X3.5X2.9X (Screw) IMPLANT
SCREW LOCKING 3.5X14 (Screw) ×4 IMPLANT
SPONGE LAP 18X18 X RAY DECT (DISPOSABLE) ×1 IMPLANT
STAPLER VISISTAT 35W (STAPLE) ×1 IMPLANT
SUCTION FRAZIER HANDLE 10FR (MISCELLANEOUS) ×1
SUCTION TUBE FRAZIER 10FR DISP (MISCELLANEOUS) ×1 IMPLANT
SUT ETHILON 2 0 PSLX (SUTURE) ×2 IMPLANT
SUT VIC AB 2-0 CT1 27 (SUTURE) ×2
SUT VIC AB 2-0 CT1 TAPERPNT 27 (SUTURE) ×1 IMPLANT
TOWEL OR 17X24 6PK STRL BLUE (TOWEL DISPOSABLE) ×2 IMPLANT
TOWEL OR 17X26 10 PK STRL BLUE (TOWEL DISPOSABLE) ×2 IMPLANT
TUBE CONNECTING 12X1/4 (SUCTIONS) ×2 IMPLANT

## 2017-03-29 NOTE — Anesthesia Preprocedure Evaluation (Addendum)
Anesthesia Evaluation  Patient identified by MRN, date of birth, ID band Patient awake    Reviewed: Allergy & Precautions, NPO status , Patient's Chart, lab work & pertinent test results  Airway Mallampati: II  TM Distance: >3 FB Neck ROM: Full    Dental  (+) Poor Dentition, Dental Advisory Given   Pulmonary Current Smoker,    breath sounds clear to auscultation + decreased breath sounds      Cardiovascular  Rhythm:Regular Rate:Normal     Neuro/Psych    GI/Hepatic   Endo/Other    Renal/GU      Musculoskeletal   Abdominal   Peds  Hematology   Anesthesia Other Findings   Reproductive/Obstetrics                            Anesthesia Physical Anesthesia Plan  ASA: II  Anesthesia Plan: General and Regional   Post-op Pain Management:    Induction: Intravenous  Airway Management Planned: LMA  Additional Equipment:   Intra-op Plan:   Post-operative Plan: Extubation in OR  Informed Consent: I have reviewed the patients History and Physical, chart, labs and discussed the procedure including the risks, benefits and alternatives for the proposed anesthesia with the patient or authorized representative who has indicated his/her understanding and acceptance.   Dental advisory given  Plan Discussed with: CRNA and Anesthesiologist  Anesthesia Plan Comments:         Anesthesia Quick Evaluation

## 2017-03-29 NOTE — Transfer of Care (Signed)
Immediate Anesthesia Transfer of Care Note  Patient: Austin Dillon  Procedure(s) Performed: Procedure(s): OPEN REDUCTION INTERNAL FIXATION (ORIF) RIGHT  ANKLE FRACTURE, Removal of External Fixator (Right)  Patient Location: PACU  Anesthesia Type:General  Level of Consciousness: awake, alert , oriented and patient cooperative  Airway & Oxygen Therapy: Patient Spontanous Breathing  Post-op Assessment: Report given to RN, Post -op Vital signs reviewed and stable and Patient moving all extremities X 4  Post vital signs: Reviewed and stable  Last Vitals:  Vitals:   03/28/17 2045 03/29/17 0540  BP: 129/62 140/76  Pulse: 75 78  Resp: 18 18  Temp: 37.3 C 37 C    Last Pain:  Vitals:   03/29/17 1101  TempSrc:   PainSc: 5       Patients Stated Pain Goal: 3 (32/95/18 8416)  Complications: No apparent anesthesia complications

## 2017-03-29 NOTE — Progress Notes (Signed)
Anesthesiology Note:  66 year old male underwent ORIF of R.ankle fracture under general anesthesia with LMA. Patient has extremely poor dentition with severe dental caries and periodontal disease. Upon emergence from anesthesia, patient bit down on LMA and was noted to have a broken fragment of front upper incisor.  I explained to the patient in PACU what happened and he understood and stated he needs to have all of his teeth removed.  Roberts Gaudy

## 2017-03-29 NOTE — Op Note (Signed)
03/26/2017 - 03/29/2017  3:22 PM  PATIENT:  Austin Dillon    PRE-OPERATIVE DIAGNOSIS:  Open Right Bimalleolar Ankle Fracture  POST-OPERATIVE DIAGNOSIS:  Same  PROCEDURE:  OPEN REDUCTION INTERNAL FIXATION (ORIF) RIGHT  ANKLE FRACTURE, Removal of External Fixator Local tissue rearrangement for wound closure immediately 10 x 6 cm  Wound VAC applied, Prevena  SURGEON:  Newt Minion, MD  PHYSICIAN ASSISTANT:None ANESTHESIA:   General  PREOPERATIVE INDICATIONS:  Austin Dillon is a  66 y.o. male with a diagnosis of Open Right Bimalleolar Ankle Fracture who failed conservative measures and elected for surgical management.    The risks benefits and alternatives were discussed with the patient preoperatively including but not limited to the risks of infection, bleeding, nerve injury, cardiopulmonary complications, the need for revision surgery, among others, and the patient was willing to proceed.  OPERATIVE IMPLANTS: Synthes 8 hole one third tubular plate  OPERATIVE FINDINGS: Tissue ischemic over the medial malleolus  OPERATIVE PROCEDURE: Patient was brought to the operating room and underwent a general anesthetic. After adequate levels of anesthesia were obtained patient's right lower extremity was prepped using Betadine scrub the external fixator was removed the pin through the calcaneus was left in place to allow for traction during the case. This was then dried a sterile field prepared and the leg was then prepped using DuraPrep draped into a sterile field a timeout was called. A lateral incision was made over the fibula. This was carried sharply down to the bone using the calcaneal pin for reduction the fracture was reduced and an interfrag screw was placed and then a neutralization plate was placed laterally with 2 locking screws distally and 3 compression screws proximally. C-arm fluoroscopy verified reduction. This wound was irrigated with normal saline subcutaneous is closed using 2-0  Vicryl the skin was closed using staples. Attention was then focused on the medial wound. The sutures were removed patient had a star shaped laceration that was 10 x 6 cm. The wound was irrigated with skin soft tissue muscle resection. The joint was irrigated with normal saline the fracture was reduced and stabilized with 2- 4.0 cannulated screws. C-arm fluoroscopy verified reduction of the mortise. The wound was again irrigated local tissue rearrangement was used to close the wound 10 x 6 cm. A Prevena incisional VAC was applied. Medially to laterally this had a good suction fit patient was extubated taken to the PACU in stable condition.

## 2017-03-29 NOTE — Progress Notes (Signed)
Tech offered Pt a bath. Pt stated he had received a wipe off last night and did not want to bath today. Wife at beside.

## 2017-03-29 NOTE — H&P (View-Only) (Signed)
ORTHOPAEDIC CONSULTATION  REQUESTING PHYSICIAN: Mcarthur Rossetti, *  Chief Complaint: Open fracture dislocation bimalleolar right ankle  HPI: Austin Dillon is a 66 y.o. male who presents with open fracture dislocation right ankle. Patient states that he sustained the injury in the outdoors had to crawl back in. Patient sustained multiple abrasions and contusions to his forearms.  History reviewed. No pertinent past medical history. Past Surgical History:  Procedure Laterality Date  . EXTERNAL FIXATION LEG Right 03/26/2017   Procedure: EXTERNAL FIXATION RIGHT LEG;  Surgeon: Mcarthur Rossetti, MD;  Location: [facility];  Service: Orthopedics;  Laterality: Right;   Social History   Social History  . Marital status: Married    Spouse name: N/A  . Number of children: N/A  . Years of education: N/A   Social History Main Topics  . Smoking status: Current Every Day Smoker    Packs/day: 1.00    Years: 25.00    Types: Cigarettes  . Smokeless tobacco: None  . Alcohol use None  . Drug use: Unknown  . Sexual activity: Not Asked   Other Topics Concern  . None   Social History Narrative  . None   No family history on file. - negative except otherwise stated in the family history section Allergies  Allergen Reactions  . Nortriptyline Other (See Comments)    Severe mood swings   Prior to Admission medications   Medication Sig Start Date End Date Taking? Authorizing Provider  alprazolam Duanne Moron) 2 MG tablet Take 2 mg by mouth 3 (three) times daily as needed for anxiety.   Yes Historical Provider, MD  aspirin EC 81 MG tablet Take 81 mg by mouth at bedtime.    Yes Historical Provider, MD  butalbital-acetaminophen-caffeine (FIORICET, ESGIC) 50-325-40 MG tablet Take 1 tablet by mouth daily as needed for headache.   Yes Historical Provider, MD  Cholecalciferol (VITAMIN D-3 PO) Take 1 capsule by mouth at bedtime.   Yes Historical Provider, MD  DULoxetine (CYMBALTA) 20 MG capsule  Take 20 mg by mouth daily. 08/29/16  Yes Historical Provider, MD  lamoTRIgine (LAMICTAL) 100 MG tablet Take 100-150 mg by mouth See admin instructions. 100 mg in the morning and 150 mg in the evening   Yes Historical Provider, MD  ALPRAZolam (XANAX) 0.5 MG tablet Take 1 tablet (0.5 mg total) by mouth 3 (three) times daily as needed for anxiety. Patient not taking: Reported on 03/26/2017 04/26/13   Orlena Sheldon, PA-C  escitalopram (LEXAPRO) 10 MG tablet Take 1 tablet (10 mg total) by mouth daily. Patient not taking: Reported on 03/26/2017 04/26/13   Orlena Sheldon, PA-C  meloxicam (MOBIC) 7.5 MG tablet Take 1 tablet (7.5 mg total) by mouth daily. Patient not taking: Reported on 03/26/2017 04/26/13   Orlena Sheldon, PA-C  metaxalone (SKELAXIN) 800 MG tablet Take 1 tablet (800 mg total) by mouth 4 (four) times daily. Patient not taking: Reported on 03/26/2017 04/26/13   Orlena Sheldon, PA-C   Dg Ankle 2 Views Right  Result Date: 03/27/2017 CLINICAL DATA:  External fixation of right ankle fracture EXAM: DG C-ARM 61-120 MIN; RIGHT ANKLE - 2 VIEW COMPARISON:  Preoperative study from 03/26/2017 FINDINGS: A total of 41 seconds was utilized for external fixation reduction of trimalleolar fracture dislocation of the right ankle. Alignment is improved and closer to anatomic. IMPRESSION: External fixation and reduction of trimalleolar fracture-dislocation of right ankle. Electronically Signed   By: Ashley Royalty M.D.   On: 03/27/2017 00:21  Dg Tibia/fibula Right Port  Result Date: 03/26/2017 CLINICAL DATA:  Ankle pain after fall 12 feet. EXAM: PORTABLE RIGHT TIBIA AND FIBULA - 2 VIEW COMPARISON:  None. FINDINGS: There is an acute, closed, bimalleolar and likely trimalleolar fracture-dislocation at the ankle joint with the talar dome laterally dislocated relative to the tibial plafond as well as 90 degree laterally rotated. No dislocation of the knee joint. Calcaneal enthesophytes are present along the plantar dorsal aspect. No  apparent calcaneal fracture. Midfoot articulations and subtalar joints are grossly intact. There is an accessory ossicle adjacent to cuboid. IMPRESSION: There is an acute, closed, bimalleolar and likely trimalleolar fracture-dislocation at the ankle joint with the talar dome laterally dislocated relative to the tibial plafond as well as 90 degree externally rotated. Electronically Signed   By: Ashley Royalty M.D.   On: 03/26/2017 19:59   Dg Ankle Right Port  Result Date: 03/26/2017 CLINICAL DATA:  Acute right ankle pain after 12 foot fall from a wall. EXAM: PORTABLE RIGHT ANKLE - 2 VIEW COMPARISON:  None. FINDINGS: There is an acute, closed, bimalleolar and likely trimalleolar fracture-dislocation at the ankle joint with the talar dome laterally dislocated relative to the tibial plafond as well as 90 degree laterally rotated. There is a transverse fracture through the medial malleolus with an oblique fracture through the distal diaphysis of the fibula. An adjacent ossific density may represent a posterior malleolar fracture fragment. IMPRESSION: There is an acute, closed, bimalleolar and likely trimalleolar fracture-dislocation at the ankle joint with the talar dome laterally dislocated relative to the tibial plafond as well as 90 degree laterally rotated. Electronically Signed   By: Ashley Royalty M.D.   On: 03/26/2017 19:57   Dg C-arm 1-60 Min  Result Date: 03/27/2017 CLINICAL DATA:  External fixation of right ankle fracture EXAM: DG C-ARM 61-120 MIN; RIGHT ANKLE - 2 VIEW COMPARISON:  Preoperative study from 03/26/2017 FINDINGS: A total of 41 seconds was utilized for external fixation reduction of trimalleolar fracture dislocation of the right ankle. Alignment is improved and closer to anatomic. IMPRESSION: External fixation and reduction of trimalleolar fracture-dislocation of right ankle. Electronically Signed   By: Ashley Royalty M.D.   On: 03/27/2017 00:21   - pertinent xrays, CT, MRI studies were reviewed  and independently interpreted  Positive ROS: All other systems have been reviewed and were otherwise negative with the exception of those mentioned in the HPI and as above.  Physical Exam: General: Alert, no acute distress Psychiatric: Patient is competent for consent with normal mood and affect Lymphatic: No axillary or cervical lymphadenopathy Cardiovascular: No pedal edema Respiratory: No cyanosis, no use of accessory musculature GI: No organomegaly, abdomen is soft and non-tender  Skin: Patient has bruises and scrapes cuts to both forearms. The right lower extremity is wrapped. He can wiggle his toes well he has good capillary refill.   Neurologic: Patient does not have protective sensation bilateral lower extremities.   MUSCULOSKELETAL:  Radiographs show a displaced lateral malleolar fracture as well as a displaced medial malleolar fracture. The external fixator has stabilized the alignment of his ankle.  Assessment: Assessment: Open type IIIB bimalleolar right ankle fracture.  Plan: Plan: We'll return to the operating room tomorrow Wednesday for repeat irrigation and debridement and internal fixation of the fibula and medial malleolus. Risks and benefits were discussed including infection neurovascular injury traumatic arthritis nonhealing of the wounds need for additional surgery. Patient states he understands wish to proceed at this time.  Discussed the importance of smoking  cessation. Discussed that if patient does not stop smoking he is at increased risk of the wounds not healing and increased risk for potential amputation. Patient and his wife state they understand. Continue IV Kefzol. Order placed for IV Toradol.  Thank you for the consult and the opportunity to see Austin Dillon, [place] [phone] 6:36 AM

## 2017-03-29 NOTE — Progress Notes (Addendum)
PT Cancellation Note  Patient Details Name: [name] MRN: [id number] DOB: 09-22-1951   Cancelled Treatment:    Reason Eval/Treat Not Completed: Medical issues which prohibited therapy (pt going for surgery at 12:45. Will have PT sign off and need reorders after surgery with updated activity restrictions and weight bearing status.   Benjiman Core, PTA Acute Rehab [phone]  Allena Katz 03/29/2017, 12:25 PM

## 2017-03-29 NOTE — Interval H&P Note (Signed)
History and Physical Interval Note:  03/29/2017 6:20 AM  Austin Dillon  has presented today for surgery, with the diagnosis of Open Right Bimalleolar Ankle Fracture  The various methods of treatment have been discussed with the patient and family. After consideration of risks, benefits and other options for treatment, the patient has consented to  Procedure(s): OPEN REDUCTION INTERNAL FIXATION (ORIF) RIGHT  ANKLE FRACTURE (Right) as a surgical intervention .  The patient's history has been reviewed, patient examined, no change in status, stable for surgery.  I have reviewed the patient's chart and labs.  Questions were answered to the patient's satisfaction.     Newt Minion

## 2017-03-29 NOTE — Progress Notes (Signed)
Physical Therapy Discharge Patient Details Name: Austin Dillon MRN: [id number] DOB: 10-21-1951 Today's Date: 03/29/2017 Time:  -     Patient discharged from PT services secondary to surgery - will need to re-order PT to resume therapy services.  Please see latest therapy progress note for current level of functioning and progress toward goals.    Progress and discharge plan discussed with patient and/or caregiver: Patient/Caregiver agrees with plan  GP     [place] Lazar Tierce,PT Acute Rehabilitation [phone] (pager)  03/29/2017, 12:36 PM

## 2017-03-29 NOTE — Anesthesia Postprocedure Evaluation (Addendum)
Anesthesia Post Note  Patient: [name]  Procedure(s) Performed: Procedure(s) (LRB): OPEN REDUCTION INTERNAL FIXATION (ORIF) RIGHT  ANKLE FRACTURE, Removal of External Fixator (Right)  Patient location during evaluation: PACU Anesthesia Type: Regional Level of consciousness: awake, awake and alert and oriented Pain management: pain level controlled Vital Signs Assessment: post-procedure vital signs reviewed and stable Respiratory status: spontaneous breathing, nonlabored ventilation and respiratory function stable Cardiovascular status: blood pressure returned to baseline Anesthetic complications: yes Anesthetic complication details: anesthesia complicationsComments: Patient suffered broken upper front incisor. See progress notes       Last Vitals:  Vitals:   03/29/17 1700 03/29/17 2118  BP: (!) 162/74 131/65  Pulse: 91 84  Resp: 16 19  Temp: 37.8 C 37.4 C    Last Pain:  Vitals:   03/29/17 2118  TempSrc: Oral  PainSc:                  Austin Dillon

## 2017-03-29 NOTE — Anesthesia Procedure Notes (Signed)
Procedure Name: LMA Insertion Date/Time: 03/29/2017 2:20 PM Performed by: Julieta Bellini Pre-anesthesia Checklist: Patient identified, Emergency Drugs available, Suction available and Patient being monitored Patient Re-evaluated:Patient Re-evaluated prior to inductionOxygen Delivery Method: Circle system utilized Preoxygenation: Pre-oxygenation with 100% oxygen Intubation Type: IV induction Ventilation: Mask ventilation with difficulty LMA: LMA inserted LMA Size: 5.0 Number of attempts: 1 Placement Confirmation: positive ETCO2 and breath sounds checked- equal and bilateral Tube secured with: Tape Dental Injury: Teeth and Oropharynx as per pre-operative assessment

## 2017-03-29 NOTE — Progress Notes (Signed)
Orthopedic Tech Progress Note Patient Details:  Austin Dillon [birth year] [id number]  Ortho Devices Type of Ortho Device: CAM walker Ortho Device/Splint Location: RLE Ortho Device/Splint Interventions: Ordered, Application   Braulio Bosch 03/29/2017, 5:46 PM

## 2017-03-29 NOTE — Anesthesia Procedure Notes (Signed)
Anesthesia Regional Block: Adductor canal block   Pre-Anesthetic Checklist: ,, timeout performed, Correct Patient, Correct Site, Correct Laterality, Correct Procedure, Correct Position, site marked, Risks and benefits discussed,  Surgical consent,  Pre-op evaluation,  At surgeon's request and post-op pain management  Laterality: Right  Prep: chloraprep       Needles:  Injection technique: Single-shot  Needle Type: Echogenic Stimulator Needle          Additional Needles:   Procedures: ultrasound guided,,,,,,,,  Narrative:  Start time: 03/29/2017 4:45 PM End time: 03/29/2017 4:50 PM Injection made incrementally with aspirations every 5 mL.  Performed by: Personally  Anesthesiologist: Hanalei Glace  Additional Notes: 20 cc 0.5% Naropin injected easily

## 2017-03-29 NOTE — Anesthesia Procedure Notes (Signed)
Anesthesia Regional Block: Popliteal block   Pre-Anesthetic Checklist: ,, timeout performed, Correct Patient, Correct Site, Correct Laterality, Correct Procedure, Correct Position, site marked, Risks and benefits discussed,  Surgical consent,  Pre-op evaluation,  At surgeon's request and post-op pain management  Laterality: Right  Prep: chloraprep       Needles:  Injection technique: Single-shot  Needle Type: Echogenic Stimulator Needle          Additional Needles:   Procedures: ultrasound guided,,,,,,,,  Narrative:  Start time: 03/29/2017 4:40 PM End time: 03/29/2017 4:45 PM Injection made incrementally with aspirations every 5 mL.  Performed by: Personally  Anesthesiologist: Nusaybah Ivie  Additional Notes: 25 cc 0.5% bupivacaine with 1:200 Epi injected easily

## 2017-03-30 ENCOUNTER — Telehealth (INDEPENDENT_AMBULATORY_CARE_PROVIDER_SITE_OTHER): Payer: Self-pay | Admitting: Orthopedic Surgery

## 2017-03-30 ENCOUNTER — Encounter (HOSPITAL_COMMUNITY): Payer: Self-pay | Admitting: Orthopedic Surgery

## 2017-03-30 ENCOUNTER — Encounter (INDEPENDENT_AMBULATORY_CARE_PROVIDER_SITE_OTHER): Payer: Self-pay | Admitting: Radiology

## 2017-03-30 NOTE — Progress Notes (Signed)
Occupational Therapy Treatment Patient Details Name: Austin Dillon MRN: [id number] DOB: [birth year] Today's Date: 03/30/2017    History of present illness  66 yo male who accidentally fell off of a wall onto his right ankle causing a severe deformity.  Was brought to the ED with a right open ankle fracture/dislocation.  Pt is a smoker and wife reports he has Alzheimer's dementia, no other medical history on file.  pt s/p external fixator 03/26/17. Pt s/p R ankle ORIF and removal of fixator on 03/29/17 with wound vac placement   OT comments  Pt progressing towards acute OT goals, impulsive but able to maintain NWB status. Still min A for transfers/mobility. Pt completed bed mobility and in-room functional mobility with second person present for chair follow. Updating d/c recommendation to SNF as pt will be needing skilled care at d/c that spouse unable to provide per chart review. Spouse not present during session.   Follow Up Recommendations  SNF    Equipment Recommendations  3 in 1 bedside commode;Other (comment) (rw)    Recommendations for Other Services      Precautions / Restrictions Precautions Precautions: Fall Required Braces or Orthoses: Other Brace/Splint Other Brace/Splint: cam boot Restrictions Weight Bearing Restrictions: Yes RLE Weight Bearing: Non weight bearing Other Position/Activity Restrictions: strict NWB with fracture boot       Mobility Bed Mobility Overal bed mobility: Needs Assistance Bed Mobility: Supine to Sit     Supine to sit: Supervision;HOB elevated     General bed mobility comments: HOB slightly elevated, but came to EOB with S only  Transfers Overall transfer level: Needs assistance Equipment used: Rolling walker (2 wheeled) Transfers: Sit to/from Stand Sit to Stand: Min assist         General transfer comment: Cues for hand placement. MIN A to power up.  Pt able to maintain R LE NWB    Balance Overall balance assessment: Needs  assistance Sitting-balance support: No upper extremity supported Sitting balance-Leahy Scale: Good     Standing balance support: Bilateral upper extremity supported Standing balance-Leahy Scale: Poor Standing balance comment: requires RW for maintaining R LE NWB                           ADL either performed or assessed with clinical judgement   ADL Overall ADL's : Needs assistance/impaired Eating/Feeding: Set up   Grooming: Set up;Sitting   Upper Body Bathing: Supervision/ safety;Sitting   Lower Body Bathing: Maximal assistance;Sit to/from stand   Upper Body Dressing : Set up;Sitting   Lower Body Dressing: Maximal assistance;Sit to/from stand;Moderate assistance   Toilet Transfer: Minimal assistance;Stand-pivot;BSC;RW Toilet Transfer Details (indicate cue type and reason): Did ambulate in room with chair follow Toileting- Clothing Manipulation and Hygiene: Minimal assistance;Sit to/from stand       Functional mobility during ADLs: Minimal assistance;Rolling walker;+2 for safety/equipment General ADL Comments: Pt completed bed mobility and in-room functional mobiltiy as detailed above. Able to maintain NWB. Chair follow for mobility helpful.      Vision       Perception     Praxis      Cognition Arousal/Alertness: Awake/alert Behavior During Therapy: Impulsive Overall Cognitive Status: No family/caregiver present to determine baseline cognitive functioning                                 General Comments: per previous notes, wife reports Alzheimer's however  pt able to recall and maintain R LE NWB status and oriented to situation.        Exercises     Shoulder Instructions       General Comments Spouse not present during session.     Pertinent Vitals/ Pain       Pain Assessment: 0-10 Pain Score: 1  Pain Location: R LE Pain Descriptors / Indicators: Sore Pain Intervention(s): Monitored during session  Home Living  Family/patient expects to be discharged to:: Skilled nursing facility Living Arrangements: Spouse/significant other Available Help at Discharge: Family;Available 24 hours/day Type of Home: House Home Access: Stairs to enter [employer] of Steps: 4 Entrance Stairs-Rails: Right;Left;Can reach both Home Layout: One level;Laundry or work area in basement     [employer] Shower/Tub: Occupational psychologist: None   Additional Comments: wife reports that he needs supervision      Prior Functioning/Environment Level of Independence: Independent            Frequency  Min 2X/week        Progress Toward Goals  OT Goals(current goals can now be found in the care plan section)     Acute Rehab OT Goals Patient Stated Goal: none stated OT Goal Formulation: With patient Time For Goal Achievement: 04/13/17 Potential to Achieve Goals: Good ADL Goals Pt Will Perform Grooming: with set-up;sitting Pt Will Perform Upper Body Bathing: with set-up;sitting Pt Will Transfer to Toilet: with supervision;stand pivot transfer;bedside commode Additional ADL Goal #1: Pt will demonstrate LB dressing with Min (A) from caregiver   Plan Discharge plan needs to be updated    Co-evaluation    PT/OT/SLP Co-Evaluation/Treatment: Yes Reason for Co-Treatment: For patient/therapist safety PT goals addressed during session: Mobility/safety with mobility;Balance;Proper use of DME OT goals addressed during session: ADL's and self-care      End of Session Equipment Utilized During Treatment: Rolling walker;Gait belt  OT Visit Diagnosis: Unsteadiness on feet (R26.81);Pain Pain - Right/Left: Right Pain - part of body: Leg   Activity Tolerance Patient tolerated treatment well   Patient Left in chair;with call bell/phone within reach;with chair alarm set   Nurse Communication          Time: 1224-4975 OT Time Calculation (min): 27  min  Charges: OT General Charges $OT Visit: 1 Procedure OT Treatments $Self Care/Home Management : 8-22 mins     Hortencia Pilar 03/30/2017, 12:51 PM

## 2017-03-30 NOTE — NC FL2 (Signed)
[place] LEVEL OF CARE SCREENING TOOL     IDENTIFICATION  Patient Name: Austin Dillon Birthdate: 18-Dec-1950 Sex: male Admission Date (Current Location): 03/26/2017  [facility] and [place] Number:  Herbalist and Address:  [facility], [place] [address] [place], [place] [address]      Provider Number: [id number]  Attending Physician Name and Address:  Mcarthur Rossetti, *  Relative Name and Phone Number:       Current Level of Care: Hospital Recommended Level of Care: [place] Prior Approval Number:    Date Approved/Denied: 03/30/17 PASRR Number: [id number]  Discharge Plan: SNF    Current Diagnoses: Patient Active Problem List   Diagnosis Date Noted  . Open ankle fracture, right, type I or II, initial encounter 03/26/2017  . Type III open fracture of right ankle 03/26/2017    Orientation RESPIRATION BLADDER Height & Weight     Self, Time, Situation, Place  Normal Continent Weight: 180 lb (81.6 kg) (patient reported) Height:     BEHAVIORAL SYMPTOMS/MOOD NEUROLOGICAL BOWEL NUTRITION STATUS      Continent Diet (See DC Summary)  AMBULATORY STATUS COMMUNICATION OF NEEDS Skin   Limited Assist Verbally Wound Vac, Surgical wounds (Incision Closed Ankle Right Wound, Wound Vac)                       Personal Care Assistance Level of Assistance  Bathing, Feeding, Dressing Bathing Assistance: Maximum assistance Feeding assistance: Limited assistance Dressing Assistance: Maximum assistance     Functional Limitations Info  Sight, Hearing Sight Info: Adequate Hearing Info: Adequate Speech Info: Adequate    SPECIAL CARE FACTORS FREQUENCY  PT (By licensed PT)     PT Frequency: 5xweek OT Frequency: 5xweek            Contractures      Additional Factors Info  Code Status, Allergies, Psychotropic Code Status Info: Full Allergies Info: NORTRIPTYLINE  Psychotropic Info: Cymbalta          Current Medications (03/30/2017):  This is the current hospital active medication list Current Facility-Administered Medications  Medication Dose Route Frequency Provider Last Rate Last Dose  . 0.9 %  sodium chloride infusion   Intravenous Continuous Newt Minion, MD 10 mL/hr at 03/29/17 1800    . acetaminophen (TYLENOL) tablet 650 mg  650 mg Oral Q6H PRN Newt Minion, MD       Or  . acetaminophen (TYLENOL) suppository 650 mg  650 mg Rectal Q6H PRN Newt Minion, MD      . ALPRAZolam Duanne Moron) tablet 2 mg  2 mg Oral TID PRN Mcarthur Rossetti, MD   2 mg at 03/30/17 0841  . bisacodyl (DULCOLAX) suppository 10 mg  10 mg Rectal Daily PRN Newt Minion, MD      . butalbital-acetaminophen-caffeine (FIORICET, ESGIC) [id number] MG per tablet 1 tablet  1 tablet Oral Daily PRN Mcarthur Rossetti, MD      . ceFAZolin (ANCEF) IVPB 1 g/50 mL premix  1 g Intravenous Q8H Mcarthur Rossetti, MD   Stopped at 03/30/17 1255  . diphenhydrAMINE (BENADRYL) 12.5 MG/5ML elixir 12.5-25 mg  12.5-25 mg Oral Q4H PRN Mcarthur Rossetti, MD      . docusate sodium (COLACE) capsule 100 mg  100 mg Oral BID Newt Minion, MD   100 mg at 03/30/17 0841  . DULoxetine (CYMBALTA) DR capsule 20 mg  20 mg Oral Daily Harrell Gave  Kerry Fort, MD   20 mg at 03/30/17 0841  . HYDROmorphone (DILAUDID) injection 1 mg  1 mg Intravenous Q2H PRN Newt Minion, MD   1 mg at 03/29/17 2322  . lactated ringers infusion   Intravenous Continuous Roberts Gaudy, MD 50 mL/hr at 03/29/17 1306    . lamoTRIgine (LAMICTAL) tablet 100 mg  100 mg Oral Daily Mcarthur Rossetti, MD   100 mg at 03/30/17 0841  . lamoTRIgine (LAMICTAL) tablet 150 mg  150 mg Oral QHS Mcarthur Rossetti, MD   150 mg at 03/29/17 2134  . magnesium citrate solution 1 Bottle  1 Bottle Oral Once PRN Newt Minion, MD      . methocarbamol (ROBAXIN) tablet 500 mg  500 mg Oral Q6H PRN Newt Minion, MD       Or  . methocarbamol (ROBAXIN) 500 mg in dextrose 5 % 50 mL  IVPB  500 mg Intravenous Q6H PRN Newt Minion, MD      . metoCLOPramide (REGLAN) tablet 5-10 mg  5-10 mg Oral Q8H PRN Newt Minion, MD       Or  . metoCLOPramide (REGLAN) injection 5-10 mg  5-10 mg Intravenous Q8H PRN Newt Minion, MD      . ondansetron [facility]) tablet 4 mg  4 mg Oral Q6H PRN Newt Minion, MD       Or  . ondansetron [facility]) injection 4 mg  4 mg Intravenous Q6H PRN Meridee Score V, MD      . oxyCODONE (Oxy IR/ROXICODONE) immediate release tablet 5-10 mg  5-10 mg Oral Q3H PRN Newt Minion, MD   10 mg at 03/30/17 0841  . polyethylene glycol (MIRALAX / GLYCOLAX) packet 17 g  17 g Oral Daily PRN Newt Minion, MD         Discharge Medications: Please see discharge summary for a list of discharge medications.  Relevant Imaging Results:  Relevant Lab Results:   Additional Information [id number], wound vac right ankle  Normajean Baxter, LCSW

## 2017-03-30 NOTE — Care Management Important Message (Signed)
Important Message  Patient Details  Name: Austin Dillon MRN: [id number] Date of Birth: Nov 17, 1951   Medicare Important Message Given:  Yes    Orbie Pyo 03/30/2017, 12:01 PM

## 2017-03-30 NOTE — Telephone Encounter (Signed)
Patient's wife Mardene Celeste) called advised needing a Rx written for a walker that he can sit down and prop his leg up with a cushsion. She asked that the Rx be faxed to the [facility] department in [place] per [facility]. [phone]. The number to contact Mardene Celeste is [phone] or [phone]

## 2017-03-30 NOTE — Evaluation (Signed)
Physical Therapy Evaluation Patient Details Name: Austin Dillon MRN: [id number] DOB: 1951-11-17 Today's Date: 03/30/2017   History of Present Illness   66 yo male who accidentally fell off of a wall onto his right ankle causing a severe deformity.  Was brought to the ED with a right open ankle fracture/dislocation.  Pt is a smoker and wife reports he has Alzheimer's dementia, no other medical history on file.  pt s/p external fixator 03/26/17. Pt s/p R ankle ORIF and removal of fixator on 03/29/17 with wound vac placement  Clinical Impression  Pt admitted with above diagnosis. Pt currently with functional limitations due to the deficits listed below (see PT Problem List). Pt will benefit from skilled PT to increase their independence and safety with mobility to allow discharge to the venue listed below.  Pt impulsive, but is able to maintain NWB status well.  He was limited by fatigue in L LE with gait.  Per chart review, home with wife, may not be the safest d/c option at this time.  Recommend SNF.  If they refuse SNF, pt will need RW and w/c for longer distances. Also noted in chart review that wife is interested in pt getting a rollator.  A rollator would not be a safe option for pt at this time due to needing to maintain strict NWB on R LE.   Wife was not present during session today.     Follow Up Recommendations SNF;Supervision/Assistance - 24 hour    Equipment Recommendations  Rolling walker with 5" wheels    Recommendations for Other Services       Precautions / Restrictions Precautions Precautions: Fall Required Braces or Orthoses: Other Brace/Splint Other Brace/Splint: cam boot Restrictions Weight Bearing Restrictions: Yes RLE Weight Bearing: Non weight bearing Other Position/Activity Restrictions: strict NWB with fracture boot      Mobility  Bed Mobility Overal bed mobility: Needs Assistance Bed Mobility: Supine to Sit     Supine to sit: Supervision;HOB elevated      General bed mobility comments: HOB slightly elevated, but came to EOB with S only  Transfers Overall transfer level: Needs assistance Equipment used: Rolling walker (2 wheeled) Transfers: Sit to/from Stand Sit to Stand: Min assist         General transfer comment: Cues for hand placement. MIN A to power up.  Pt able to maintain R LE NWB  Ambulation/Gait Ambulation/Gait assistance: Min assist Ambulation Distance (Feet): 15 Feet Assistive device: Rolling walker (2 wheeled) Gait Pattern/deviations: Step-to pattern   Gait velocity interpretation: Below normal speed for age/gender General Gait Details: Pt able to hop on L foot with good foot clearance and maintain R LE NWB with  boot.  He did demonstrate some unsteadiness and needed MIN A with RW placement as he wanted to place too far forward.  Pt's gait distance limited by fatigue in L LE and needed chair follow.  Stairs            Wheelchair Mobility    Modified Rankin (Stroke Patients Only)       Balance Overall balance assessment: Needs assistance Sitting-balance support: No upper extremity supported Sitting balance-Leahy Scale: Good     Standing balance support: Bilateral upper extremity supported Standing balance-Leahy Scale: Poor Standing balance comment: requires RW for maintaining R LE NWB                             Pertinent Vitals/Pain Pain Assessment: 0-10  Pain Score: 1  Pain Location: R LE Pain Descriptors / Indicators: Sore Pain Intervention(s): Monitored during session    Home Living Family/patient expects to be discharged to:: Skilled nursing facility Living Arrangements: Spouse/significant other Available Help at Discharge: Family;Available 24 hours/day Type of Home: House Home Access: Stairs to enter Entrance Stairs-Rails: Right;Left;Can reach both Entrance Stairs-Number of Steps: 4 Home Layout: One level;Laundry or work area in [employer]: None Additional  Comments: wife reports that he needs supervision    Prior Function Level of Independence: Independent               Hand Dominance   Dominant Hand: Right    Extremity/Trunk Assessment   Upper Extremity Assessment Upper Extremity Assessment: Defer to OT evaluation    Lower Extremity Assessment Lower Extremity Assessment: RLE deficits/detail RLE Deficits / Details: hip and knee WFL, R ankle in boot    Cervical / Trunk Assessment Cervical / Trunk Assessment: Normal  Communication   Communication: No difficulties  Cognition Arousal/Alertness: Awake/alert Behavior During Therapy: Impulsive Overall Cognitive Status: No family/caregiver present to determine baseline cognitive functioning                                 General Comments: per previous notes, wife reports Alzheimer's however pt able to recall and maintain R LE NWB status and oriented to situation.      General Comments      Exercises     Assessment/Plan    PT Assessment Patient needs continued PT services  PT Problem List Decreased activity tolerance;Decreased balance;Decreased mobility;Decreased coordination;Decreased knowledge of use of DME;Decreased safety awareness       PT Treatment Interventions DME instruction;Gait training;Stair training;Functional mobility training;Therapeutic activities;Therapeutic exercise;Balance training;Neuromuscular re-education;Patient/family education    PT Goals (Current goals can be found in the Care Plan section)  Acute Rehab PT Goals Patient Stated Goal: none stated PT Goal Formulation: With patient Time For Goal Achievement: 04/13/17 Potential to Achieve Goals: Good    Frequency Min 5X/week   Barriers to discharge Inaccessible home environment steps to enter home    Co-evaluation PT/OT/SLP Co-Evaluation/Treatment: Yes Reason for Co-Treatment: For patient/therapist safety PT goals addressed during session: Mobility/safety with  mobility;Balance;Proper use of DME         End of Session Equipment Utilized During Treatment: Gait belt Activity Tolerance: Patient tolerated treatment well;Patient limited by fatigue Patient left: in chair;with call bell/phone within reach;with chair alarm set Nurse Communication: Mobility status PT Visit Diagnosis: Unsteadiness on feet (R26.81);Difficulty in walking, not elsewhere classified (R26.2)    Time: 6122-4497 PT Time Calculation (min) (ACUTE ONLY): 27 min   Charges:   PT Evaluation $PT Eval Moderate Complexity: 1 Procedure     PT G Codes:        Aaliah Jorgenson L. Tamala Julian, [place] Pager [phone] 03/30/2017   Galen Manila 03/30/2017, 11:11 AM

## 2017-03-30 NOTE — Consult Note (Addendum)
[facility] Nurse wound consult note Reason for Consult: WOC consult requested to convert Prevena Vac to hospital machine.  Martin Majestic has been alarming frequently and Dr Sharol Given requested that track pad be replaced and hospital Vac be applied.  Prevena machine turned off and left at the bedside with charger plugged in.  Mod amt old clotted blood was in the cannister and under the previous track pad when new one was applied.  Dressing left intact to RLE as requested.  Cont suction on at 167mm and functioning on hospital Vac machine.  Please refer to Dr Sharol Given for further questions regarding plan of care. Please re-consult if further assistance is needed.  Thank-you,  Julien Girt MSN, [place], [place], [place], [place]

## 2017-03-30 NOTE — Progress Notes (Signed)
Patient ID: Austin Dillon, male   DOB: 07-29-1951, 66 y.o.   MRN: [id number] Postoperative day 1 removal external fixator right ankle open reduction internal fixation for an open type IIIB bimalleolar ankle fracture and wound VAC for skin defect medially. I'm extremely concerned that patient has an area approximately 3 cm in diameter that is mildly ischemic at this time that may progress to full-thickness skin loss. This is directly over the medial malleolus.  Last night patient pulled off the track pad from the wound VAC. We will have wound care nursing replace the track pad and we will have the portable Prevena pump hooked up to a full size VAC pump while in the hospital.  Patient and his wife states they unable to care for him, patient's wife states that she faints  at the site of blood. Feel patient will need discharge to skilled nursing. Patient's wife is extremely opposed to this but I feel that this is the only safe option for him.  I will place an order for skilled nursing placement.  Patient is to be strict nonweightbearing on the right lower extremity. He is to wear the fracture boot whenever he is up for gait training.

## 2017-03-31 MED ORDER — OXYCODONE-ACETAMINOPHEN 5-325 MG PO TABS: 1.0000 | ORAL_TABLET | ORAL | 0 refills | Status: DC | PRN

## 2017-03-31 NOTE — Discharge Summary (Signed)
Discharge Diagnoses:  Principal Problem:   Open ankle fracture, right, type I or II, initial encounter Active Problems:   Type III open fracture of right ankle   Surgeries: Procedure(s): OPEN REDUCTION INTERNAL FIXATION (ORIF) RIGHT  ANKLE FRACTURE, Removal of External Fixator on 03/26/2017 - 03/29/2017    Consultants:   Discharged Condition: Improved  Hospital Course: Austin Dillon is an 66 y.o. male who was admitted 03/26/2017 with a chief complaint of open type IIIB bimalleolar right ankle fracture with large medial malleolar wound and soft tissue injury, with a final diagnosis of Open Right Bimalleolar Ankle Fracture.  Patient was brought to the operating room on 03/26/2017 - 03/29/2017 and underwent Procedure(s): OPEN REDUCTION INTERNAL FIXATION (ORIF) RIGHT  ANKLE FRACTURE, Removal of External Fixator.    Patient was given perioperative antibiotics: Anti-infectives    Start     Dose/Rate Route Frequency Ordered Stop   03/29/17 1700  ceFAZolin (ANCEF) IVPB 1 g/50 mL premix  Status:  Discontinued     1 g 100 mL/hr over 30 Minutes Intravenous Every 6 hours 03/29/17 1652 03/29/17 1701   03/27/17 1400  ceFAZolin (ANCEF) IVPB 1 g/50 mL premix     1 g 100 mL/hr over 30 Minutes Intravenous Every 8 hours 03/27/17 0706     03/27/17 0600  clindamycin (CLEOCIN) IVPB 900 mg  Status:  Discontinued     900 mg 100 mL/hr over 30 Minutes Intravenous On call to O.R. 03/26/17 2349 03/26/17 2351   03/27/17 0400  ceFAZolin (ANCEF) IVPB 1 g/50 mL premix  Status:  Discontinued     1 g 100 mL/hr over 30 Minutes Intravenous Every 6 hours 03/26/17 2350 03/27/17 0706   03/26/17 2123  clindamycin (CLEOCIN) 900 MG/50ML IVPB    Comments:  Barrett Henle   : cabinet override      03/26/17 2123 03/26/17 2142   03/26/17 1930  ceFAZolin (ANCEF) IVPB 1 g/50 mL premix     1 g 100 mL/hr over 30 Minutes Intravenous  Once 03/26/17 1915 03/26/17 1946    .  Patient was given sequential compression devices,  early ambulation, and aspirin for DVT prophylaxis.  Recent vital signs: Patient Vitals for the past 24 hrs:  BP Temp Temp src Pulse Resp SpO2 Height Weight  03/31/17 0500 124/66 99.1 F (37.3 C) Oral 87 20 98 % - -  03/30/17 2100 125/65 99.1 F (37.3 C) Oral 76 18 97 % - -  03/30/17 1752 - - - - - - 5\' 8"  (1.727 m) 180 lb (81.6 kg)  03/30/17 1408 112/65 98.7 F (37.1 C) Oral 84 16 96 % - -  .  Recent laboratory studies: No results found.  Discharge Medications:   Allergies as of 03/31/2017      Reactions   Nortriptyline Other (See Comments)   Severe mood swings      Medication List    TAKE these medications   alprazolam 2 MG tablet Commonly known as:  XANAX Take 2 mg by mouth 3 (three) times daily as needed for anxiety.   ALPRAZolam 0.5 MG tablet Commonly known as:  XANAX Take 1 tablet (0.5 mg total) by mouth 3 (three) times daily as needed for anxiety.   aspirin EC 81 MG tablet Take 81 mg by mouth at bedtime.   butalbital-acetaminophen-caffeine 50-325-40 MG tablet Commonly known as:  FIORICET, ESGIC Take 1 tablet by mouth daily as needed for headache.   DULoxetine 20 MG capsule Commonly known as:  CYMBALTA Take 20  mg by mouth daily.   escitalopram 10 MG tablet Commonly known as:  LEXAPRO Take 1 tablet (10 mg total) by mouth daily.   lamoTRIgine 100 MG tablet Commonly known as:  LAMICTAL Take 100-150 mg by mouth See admin instructions. 100 mg in the morning and 150 mg in the evening   meloxicam 7.5 MG tablet Commonly known as:  MOBIC Take 1 tablet (7.5 mg total) by mouth daily.   metaxalone 800 MG tablet Commonly known as:  SKELAXIN Take 1 tablet (800 mg total) by mouth 4 (four) times daily.   oxyCODONE-acetaminophen 5-325 MG tablet Commonly known as:  ROXICET Take 1 tablet by mouth every 4 (four) hours as needed for severe pain.   VITAMIN D-3 PO Take 1 capsule by mouth at bedtime.            Durable Medical Equipment        Start     Ordered    03/26/17 2351  DME Walker rolling  Once    Question:  Patient needs a walker to treat with the following condition  Answer:  Open right ankle fracture, type I or II, initial encounter   03/26/17 2350   03/26/17 2351  DME 3 n 1  Once     03/26/17 2350      Diagnostic Studies: Dg Ankle 2 Views Right  Result Date: 03/27/2017 CLINICAL DATA:  External fixation of right ankle fracture EXAM: DG C-ARM 61-120 MIN; RIGHT ANKLE - 2 VIEW COMPARISON:  Preoperative study from 03/26/2017 FINDINGS: A total of 41 seconds was utilized for external fixation reduction of trimalleolar fracture dislocation of the right ankle. Alignment is improved and closer to anatomic. IMPRESSION: External fixation and reduction of trimalleolar fracture-dislocation of right ankle. Electronically Signed   By: Ashley Royalty M.D.   On: 03/27/2017 00:21   Dg Tibia/fibula Right Port  Result Date: 03/26/2017 CLINICAL DATA:  Ankle pain after fall 12 feet. EXAM: PORTABLE RIGHT TIBIA AND FIBULA - 2 VIEW COMPARISON:  None. FINDINGS: There is an acute, closed, bimalleolar and likely trimalleolar fracture-dislocation at the ankle joint with the talar dome laterally dislocated relative to the tibial plafond as well as 90 degree laterally rotated. No dislocation of the knee joint. Calcaneal enthesophytes are present along the plantar dorsal aspect. No apparent calcaneal fracture. Midfoot articulations and subtalar joints are grossly intact. There is an accessory ossicle adjacent to cuboid. IMPRESSION: There is an acute, closed, bimalleolar and likely trimalleolar fracture-dislocation at the ankle joint with the talar dome laterally dislocated relative to the tibial plafond as well as 90 degree externally rotated. Electronically Signed   By: Ashley Royalty M.D.   On: 03/26/2017 19:59   Dg Ankle Right Port  Result Date: 03/26/2017 CLINICAL DATA:  Acute right ankle pain after 12 foot fall from a wall. EXAM: PORTABLE RIGHT ANKLE - 2 VIEW COMPARISON:   None. FINDINGS: There is an acute, closed, bimalleolar and likely trimalleolar fracture-dislocation at the ankle joint with the talar dome laterally dislocated relative to the tibial plafond as well as 90 degree laterally rotated. There is a transverse fracture through the medial malleolus with an oblique fracture through the distal diaphysis of the fibula. An adjacent ossific density may represent a posterior malleolar fracture fragment. IMPRESSION: There is an acute, closed, bimalleolar and likely trimalleolar fracture-dislocation at the ankle joint with the talar dome laterally dislocated relative to the tibial plafond as well as 90 degree laterally rotated. Electronically Signed   By: Ashley Royalty  M.D.   On: 03/26/2017 19:57   Dg C-arm 1-60 Min  Result Date: 03/27/2017 CLINICAL DATA:  External fixation of right ankle fracture EXAM: DG C-ARM 61-120 MIN; RIGHT ANKLE - 2 VIEW COMPARISON:  Preoperative study from 03/26/2017 FINDINGS: A total of 41 seconds was utilized for external fixation reduction of trimalleolar fracture dislocation of the right ankle. Alignment is improved and closer to anatomic. IMPRESSION: External fixation and reduction of trimalleolar fracture-dislocation of right ankle. Electronically Signed   By: Ashley Royalty M.D.   On: 03/27/2017 00:21    Patient benefited maximally from their hospital stay and there were no complications.     Disposition: Final discharge disposition not confirmed Discharge Instructions    Call MD / Call 911    Complete by:  As directed    If you experience chest pain or shortness of breath, CALL 911 and be transported to the hospital emergency room.  If you develope a fever above 101 F, pus (white drainage) or increased drainage or redness at the wound, or calf pain, call your surgeon's office.   Constipation Prevention    Complete by:  As directed    Drink plenty of fluids.  Prune juice may be helpful.  You may use a stool softener, such as Colace (over  the counter) 100 mg twice a day.  Use MiraLax (over the counter) for constipation as needed.   Diet - low sodium heart healthy    Complete by:  As directed    Elevate operative extremity    Complete by:  As directed    Increase activity slowly as tolerated    Complete by:  As directed    Negative Pressure Wound Therapy - Incisional    Complete by:  As directed    Wound VAC with Prevena portable wound. If the pump alarms and stops working remove the dressing apply 4 x 4 gauze to the wounds and an Ace wrap.   Non weight bearing    Complete by:  As directed    Laterality:  right   Extremity:  Lower     Follow-up Information    Newt Minion, MD Follow up in 1 week(s).   Specialty:  Orthopedic Surgery Contact information: [address] [place] [address] [phone]            Signed: Newt Minion 03/31/2017, 6:38 AM

## 2017-03-31 NOTE — Progress Notes (Signed)
Physical Therapy Treatment Patient Details Name: Austin Dillon MRN: [id number] DOB: 1951-10-11 Today's Date: 03/31/2017    History of Present Illness  66 yo male who accidentally fell off of a wall onto his right ankle causing a severe deformity.  Was brought to the ED with a right open ankle fracture/dislocation.  Pt is a smoker and wife reports he has Alzheimer's dementia, no other medical history on file.  pt s/p external fixator 03/26/17. Pt s/p R ankle ORIF and removal of fixator on 03/29/17 with wound vac placement    PT Comments    Emphasized transfer safety and gait.   Follow Up Recommendations  SNF;Supervision/Assistance - 24 hour     Equipment Recommendations  Rolling walker with 5" wheels    Recommendations for Other Services       Precautions / Restrictions Precautions Precautions: Fall Required Braces or Orthoses: Other Brace/Splint Other Brace/Splint: cam boot Restrictions Weight Bearing Restrictions: Yes RLE Weight Bearing: Non weight bearing Other Position/Activity Restrictions: strict NWB with fracture boot    Mobility  Bed Mobility Overal bed mobility: Needs Assistance Bed Mobility: Supine to Sit     Supine to sit: Supervision     General bed mobility comments: HOB slightly elevated, but came to EOB with S only  Transfers Overall transfer level: Needs assistance Equipment used: Rolling walker (2 wheeled) Transfers: Sit to/from Stand Sit to Stand: Min guard         General transfer comment: cues for hand placement  Ambulation/Gait Ambulation/Gait assistance: Min guard Ambulation Distance (Feet): 50 Feet Assistive device: Rolling walker (2 wheeled) Gait Pattern/deviations: Step-to pattern   Gait velocity interpretation: Below normal speed for age/gender General Gait Details: stable swing to gait with RW   Stairs            Wheelchair Mobility    Modified Rankin (Stroke Patients Only)       Balance Overall balance  assessment: Needs assistance Sitting-balance support: No upper extremity supported Sitting balance-Leahy Scale: Good     Standing balance support: Bilateral upper extremity supported Standing balance-Leahy Scale: Poor Standing balance comment: requires RW for maintaining R LE NWB                            Cognition Arousal/Alertness: Awake/alert Behavior During Therapy: Impulsive Overall Cognitive Status: No family/caregiver present to determine baseline cognitive functioning                                        Exercises      General Comments        Pertinent Vitals/Pain Pain Assessment: Faces Faces Pain Scale: Hurts little more Pain Location: R LE Pain Descriptors / Indicators: Sore Pain Intervention(s): Monitored during session    Home Living                      Prior Function            PT Goals (current goals can now be found in the care plan section) Acute Rehab PT Goals Patient Stated Goal: none stated PT Goal Formulation: With patient Time For Goal Achievement: 04/13/17 Potential to Achieve Goals: Good Progress towards PT goals: Progressing toward goals    Frequency    Min 5X/week      PT Plan Current plan remains appropriate    Co-evaluation  End of Session   Activity Tolerance: Patient tolerated treatment well Patient left: in chair;with call bell/phone within reach;with chair alarm set Nurse Communication: Mobility status PT Visit Diagnosis: Difficulty in walking, not elsewhere classified (R26.2);Unsteadiness on feet (R26.81) Pain - Right/Left: Right Pain - part of body: Ankle and joints of foot     Time: 1500-1517 PT Time Calculation (min) (ACUTE ONLY): 17 min  Charges:  $Gait Training: 8-22 mins                    G Codes:       2017/04/30  Donnella Sham, PT [phone]  (pager)   Austin Dillon 30-Apr-2017, 4:41 PM

## 2017-03-31 NOTE — Clinical Social Work Note (Signed)
Clinical Social Work Assessment  Patient Details  Name: Austin Dillon MRN: [id number] Date of Birth: Apr 10, 1951  Date of referral:  03/30/17               Reason for consult:  Facility Placement                Permission sought to share information with:  Facility Art therapist granted to share information::  Yes, Verbal Permission Granted  Name::        Agency::  SNF  Relationship::     Contact Information:     Housing/Transportation Living arrangements for the past 2 months:  Single Family Home Source of Information:  Patient Patient Interpreter Needed:  None Criminal Activity/Legal Involvement Pertinent to Current Situation/Hospitalization:  No - Comment as needed Significant Relationships:  Spouse Lives with:  Self, Spouse Do you feel safe going back to the place where you live?  No Need for family participation in patient care:  Yes (Comment)  Care giving concerns:  Patient was independent with ADL's at home with spouse prior to injury.  Patient sustained injury doing work outdoors at home. Pt reside with spouse and cannot be cared for at home. Pt is unsafe to return home at this time.   Social Worker assessment / plan:  CSW met with spouse and patient at bedside to discuss DC options and placement.  CSW explained the DC recommendations.  CSW discussed SNF placement and options.  Patient agreed to send offers out to [facility] and [facility] for placement. CSW obtained permission to send offers to other facilities to ensure adequate placement options. Patient and spouse agreed to same.  CSW faxed a form to Dr. Jess Barters office to complete about his disability. CSw encouraged spouse to f/u with that office on completion. Spouse agreed to same.  Employment status:  Retired Forensic scientist:  Medicare PT Recommendations:  [place] / Referral to community resources:  [place]  Patient/Family's Response to care:   Patient and spouse are appreciative of CSW assistance with placement. They report no issues or concerns at this time.  Patient/Family's Understanding of and Emotional Response to Diagnosis, Current Treatment, and Prognosis:  Patient and spouse have good understanding of the diagnosis, current treatment and prognosis and are in agreement with SNF placement.  Patient and spouse are hopeful that rehabilitation will improve patient's impairment. No issues or concerns reported at this time.  Emotional Assessment Appearance:  Appears older than stated age Attitude/Demeanor/Rapport:   (Cooperative) Affect (typically observed):  Accepting, Appropriate Orientation:  Oriented to Self, Oriented to Place, Oriented to  Time, Oriented to Situation Alcohol / Substance use:  Not Applicable Psych involvement (Current and /or in the community):  No (Comment)  Discharge Needs  Concerns to be addressed:  Care Coordination Readmission within the last 30 days:  No Current discharge risk:  Physical Impairment, Dependent with Mobility Barriers to Discharge:  No Barriers Identified   Normajean Baxter, LCSW 03/31/2017, 12:28 PM

## 2017-03-31 NOTE — Clinical Social Work Placement (Signed)
   CLINICAL SOCIAL WORK PLACEMENT  NOTE  Date:  03/31/2017  Patient Details  Name: Austin Dillon MRN: [id number] Date of Birth: March 05, 1951  Clinical Social Work is seeking post-discharge placement for this patient at the [place] level of care (*CSW will initial, date and re-position this form in  chart as items are completed):  Yes   Patient/family provided with [place] Work Department's list of facilities offering this level of care within the geographic area requested by the patient (or if unable, by the patient's family).  Yes   Patient/family informed of their freedom to choose among providers that offer the needed level of care, that participate in Medicare, Medicaid or managed care program needed by the patient, have an available bed and are willing to accept the patient.  Yes   Patient/family informed of [facility]'s ownership interest in [facility] and [facility], as well as of the fact that they are under no obligation to receive care at these facilities.  PASRR submitted to EDS on       PASRR number received on 03/30/17     Existing PASRR number confirmed on 03/30/17     FL2 transmitted to all facilities in geographic area requested by pt/family on 03/30/17     FL2 transmitted to all facilities within larger geographic area on 03/30/17     Patient informed that his/her managed care company has contracts with or will negotiate with certain facilities, including the following:        Yes   Patient/family informed of bed offers received.  Patient chooses bed at       Physician recommends and patient chooses bed at      Patient to be transferred to   on 03/31/17.  Patient to be transferred to facility by [facility]     Patient family notified on 03/31/17 of transfer.  Name of family member notified:  Mardene Celeste , significant other  PHYSICIAN Please prepare priority discharge summary, including medications, Please prepare  prescriptions, Please sign FL2     Additional Comment:    _______________________________________________ Normajean Baxter, LCSW 03/31/2017, 12:07 PM

## 2017-03-31 NOTE — Social Work (Signed)
Clinical Social Worker facilitated patient discharge including contacting patient family and facility to confirm patient discharge plans.  Clinical information faxed to facility and family agreeable with plan.  CSW arranged ambulance transport via [facility] to [facility].  RN to call [phone] report prior to discharge.  Clinical Social Worker will sign off for now as social work intervention is no longer needed. Please consult [place] again if new need arises.  Elissa Hefty, LCSW Clinical Social Worker [phone]

## 2017-03-31 NOTE — Progress Notes (Signed)
Patient ID: Austin Dillon, male   DOB: 28-Jan-1951, 66 y.o.   MRN: [id number] Patient and his wife are agreeable to discharge to skilled nursing. Patient can be transitioned from the hospital back to his Prevena portable wound VAC at time of discharge. I will follow-up in the office in 1 week. Prescriptions and discharge orders are completed.

## 2017-04-03 ENCOUNTER — Telehealth (INDEPENDENT_AMBULATORY_CARE_PROVIDER_SITE_OTHER): Payer: Self-pay | Admitting: Orthopedic Surgery

## 2017-04-03 ENCOUNTER — Telehealth (INDEPENDENT_AMBULATORY_CARE_PROVIDER_SITE_OTHER): Payer: Self-pay | Admitting: Radiology

## 2017-04-03 NOTE — Telephone Encounter (Signed)
Pt is s/p a right ORIF ankle fracture 03/29/17 we can write an order for a mobility aid but insurance will not cover all three as they are very similar to each other. I will hold this message the pt has has an appt on Wednesday and this can be discussed which would be best suited for him at appt.

## 2017-04-03 NOTE — Telephone Encounter (Signed)
I called and sw pt's wife to advise that Dr. Sharol Given would like for him to  Come in tomorrow to remove vac and discuss possible skin graft surgery for this week. I spoke with the unit secretary and states that she will have to talk to the wound nurse. Can you please open a spot on the schedule and sch for me please.  I will change it if the can not come.

## 2017-04-03 NOTE — Telephone Encounter (Signed)
Patient's wife Mardene Celeste) called advised patient need a regular walker, knee walker and a wheelchair. The  [facility] The fax# is [phone]. The number to contact patient is [phone]

## 2017-04-03 NOTE — Telephone Encounter (Signed)
error 

## 2017-04-03 NOTE — Telephone Encounter (Signed)
This will be written while at appt on Wednesday.

## 2017-04-03 NOTE — Telephone Encounter (Signed)
Mardene Celeste wife called, and says that patient needs to be set up for surgery soon.  She is concerned about the wound vac stopping and this causing a problem for patient.  Please call her to discuss surgery.  Malachy Mood does not know anything about this one, she does not have a blue sheet either.

## 2017-04-04 ENCOUNTER — Encounter (INDEPENDENT_AMBULATORY_CARE_PROVIDER_SITE_OTHER): Payer: Self-pay | Admitting: Orthopedic Surgery

## 2017-04-04 ENCOUNTER — Ambulatory Visit (INDEPENDENT_AMBULATORY_CARE_PROVIDER_SITE_OTHER): Payer: Medicare Other | Admitting: Orthopedic Surgery

## 2017-04-04 DIAGNOSIS — S82841J Displaced bimalleolar fracture of right lower leg, subsequent encounter for open fracture type IIIA, IIIB, or IIIC with delayed healing: Secondary | ICD-10-CM | POA: Insufficient documentation

## 2017-04-04 DIAGNOSIS — S82842F Displaced bimalleolar fracture of left lower leg, subsequent encounter for open fracture type IIIA, IIIB, or IIIC with routine healing: Secondary | ICD-10-CM

## 2017-04-04 NOTE — Progress Notes (Signed)
Office Visit Note   Patient: Austin Dillon           Date of Birth: August 16, 1951           MRN: [id number] Visit Date: 04/04/2017              Requested by: Orlena Sheldon, PA-C [place], [place] [address] PCP: Karis Juba, PA-C  Chief Complaint  Patient presents with  . Right Ankle - Routine Post Op      HPI: Patient is status post open type IIIB bimalleolar right ankle fracture. Patient had a large open wound medially with ischemic tissue initially.  Assessment & Plan: Visit Diagnoses:  1. Open fracture ankle, bimalleolar, left, type III, with routine healing, subsequent encounter     Plan: We will start Dial soap cleansing dry dressing changes with an Ace wrap. I feel the tissue medially is too tenuous for somebody to try to pull a compression sock on. I feel that he would be at risk of tearing the skin. We will start with the Ace wrap discussed strict elevation range of motion of the ankle strict nonweightbearing minimize narcotics strict nonsmoking follow-up in 2 weeks with 3 view radiographs of the right ankle.  Follow-Up Instructions: Return in about 2 weeks (around 04/18/2017).   Ortho Exam  Patient is alert, oriented, no adenopathy, well-dressed, normal affect, normal respiratory effort. Examination the medial ischemic wound has shown remarkable improvement from using the incisional Prevena wound VAC. The wound edges are well approximated there is no cellulitis no odor no drainage no signs of infection.  Imaging: No results found.  Labs: No results found for: HGBA1C, ESRSEDRATE, CRP, LABURIC, REPTSTATUS, GRAMSTAIN, CULT, LABORGA  Orders:  No orders of the defined types were placed in this encounter.  No orders of the defined types were placed in this encounter.    Procedures: No procedures performed  Clinical Data: No additional findings.  ROS:  All other systems negative, except as noted in the HPI. Review of  Systems  Objective: Vital Signs: There were no vitals taken for this visit.  Specialty Comments:  No specialty comments available.  PMFS History: Patient Active Problem List   Diagnosis Date Noted  . Open fracture ankle, bimalleolar, left, type III, with routine healing, subsequent encounter 04/04/2017  . Open ankle fracture, right, type I or II, initial encounter 03/26/2017  . Type III open fracture of right ankle 03/26/2017   Past Medical History:  Diagnosis Date  . Alzheimer disease   . Headache     No family history on file.  Past Surgical History:  Procedure Laterality Date  . APPENDECTOMY    . EXTERNAL FIXATION LEG Right 03/26/2017   Procedure: EXTERNAL FIXATION RIGHT LEG;  Surgeon: Mcarthur Rossetti, MD;  Location: [place];  Service: Orthopedics;  Laterality: Right;  . ORIF ANKLE FRACTURE Right 03/29/2017   Procedure: OPEN REDUCTION INTERNAL FIXATION (ORIF) RIGHT  ANKLE FRACTURE, Removal of External Fixator;  Surgeon: Newt Minion, MD;  Location: [place];  Service: Orthopedics;  Laterality: Right;   Social History   Occupational History  . Not on file.   Social History Main Topics  . Smoking status: Current Every Day Smoker    Packs/day: 1.00    Years: 25.00    Types: Cigarettes  . Smokeless tobacco: Never Used  . Alcohol use Yes     Comment: SOCIAL  . Drug use: No  . Sexual activity: Not on file

## 2017-04-05 ENCOUNTER — Telehealth (INDEPENDENT_AMBULATORY_CARE_PROVIDER_SITE_OTHER): Payer: Self-pay | Admitting: Orthopedic Surgery

## 2017-04-05 NOTE — Telephone Encounter (Signed)
Austin Dillon's wife called advised she will need 4x4 gauze and ace bandages to wrap around Austin Dillon's foot. She also said the [place] should cover everything. She asked for a call back as soon as possible. The number to contact Austin Dillon is [phone] or [phone]

## 2017-04-07 ENCOUNTER — Inpatient Hospital Stay (INDEPENDENT_AMBULATORY_CARE_PROVIDER_SITE_OTHER): Payer: Non-veteran care | Admitting: Orthopedic Surgery

## 2017-04-18 ENCOUNTER — Ambulatory Visit (INDEPENDENT_AMBULATORY_CARE_PROVIDER_SITE_OTHER): Payer: Medicare Other | Admitting: Orthopedic Surgery

## 2017-04-18 ENCOUNTER — Encounter (INDEPENDENT_AMBULATORY_CARE_PROVIDER_SITE_OTHER): Payer: Self-pay | Admitting: Orthopedic Surgery

## 2017-04-18 ENCOUNTER — Ambulatory Visit (INDEPENDENT_AMBULATORY_CARE_PROVIDER_SITE_OTHER): Payer: Medicare Other

## 2017-04-18 VITALS — Ht 68.0 in | Wt 180.0 lb

## 2017-04-18 DIAGNOSIS — M25571 Pain in right ankle and joints of right foot: Secondary | ICD-10-CM

## 2017-04-18 DIAGNOSIS — S82842F Displaced bimalleolar fracture of left lower leg, subsequent encounter for open fracture type IIIA, IIIB, or IIIC with routine healing: Secondary | ICD-10-CM

## 2017-04-18 NOTE — Progress Notes (Signed)
Office Visit Note   Patient: Austin Dillon           Date of Birth: [birth year]           MRN: [id number] Visit Date: 04/18/2017              Requested by: Orlena Sheldon, PA-C [address], [place] [address] PCP: Orlena Sheldon, PA-C  Chief Complaint  Patient presents with  . Right Ankle - Routine Post Op    03/29/17 right ankle ORIF      HPI: Patient is 3 weeks status post open type IIIB bimalleolar ankle fracture. Patient is currently nonweightbearing in a fracture boot and an Ace wrap.  Assessment & Plan: Visit Diagnoses:  1. Pain in right ankle and joints of right foot   2. Open fracture ankle, bimalleolar, left, type III, with routine healing, subsequent encounter     Plan: Patient will go to [place] supply to obtain medical compression stockings 15-20 mm of compression. He is to wear the stocking around-the-clock take it off daily to wash his leg with soap and water. He is still to be essentially nonweightbearing with touchdown weightbearing only to protect the soft tissue and still very concerned about the soft tissue over the medial malleolus.  Follow-Up Instructions: Return in about 1 week (around 04/25/2017).   Ortho Exam  Patient is alert, oriented, no adenopathy, well-dressed, normal affect, normal respiratory effort. Examination the lateral incision is healed nicely the staples were removed. Medially he does have swelling with a very small area of black eschar about 5 mm in diameter. The remainder the wound is macerated and ischemic. We will need to get him in some compression to improve the microcirculation and to draw off the drainage. There is no cellulitis no odor no drainage no signs of infection.  Imaging: Xr Ankle Complete Right  Result Date: 04/18/2017 Three-view radiographs the right ankle shows a congruent mortise no complicating features of the internal fixation. The fibula was out to length the medial malleolus is  congruent.   Labs: No results found for: HGBA1C, ESRSEDRATE, CRP, LABURIC, REPTSTATUS, GRAMSTAIN, CULT, LABORGA  Orders:  Orders Placed This Encounter  Procedures  . XR Ankle Complete Right   No orders of the defined types were placed in this encounter.    Procedures: No procedures performed  Clinical Data: No additional findings.  ROS:  All other systems negative, except as noted in the HPI. Review of Systems  Objective: Vital Signs: Ht 5\' 8"  (1.727 m)   Wt 180 lb (81.6 kg)   BMI 27.37 kg/m   Specialty Comments:  No specialty comments available.  PMFS History: Patient Active Problem List   Diagnosis Date Noted  . Open fracture ankle, bimalleolar, left, type III, with routine healing, subsequent encounter 04/04/2017  . Open ankle fracture, right, type I or II, initial encounter 03/26/2017  . Type III open fracture of right ankle 03/26/2017   Past Medical History:  Diagnosis Date  . Alzheimer disease   . Headache     History reviewed. No pertinent family history.  Past Surgical History:  Procedure Laterality Date  . APPENDECTOMY    . EXTERNAL FIXATION LEG Right 03/26/2017   Procedure: EXTERNAL FIXATION RIGHT LEG;  Surgeon: Mcarthur Rossetti, MD;  Location: [place];  Service: Orthopedics;  Laterality: Right;  . ORIF ANKLE FRACTURE Right 03/29/2017   Procedure: OPEN REDUCTION INTERNAL FIXATION (ORIF) RIGHT  ANKLE FRACTURE, Removal of External  Fixator;  Surgeon: Newt Minion, MD;  Location: [place];  Service: Orthopedics;  Laterality: Right;   Social History   Occupational History  . Not on file.   Social History Main Topics  . Smoking status: Current Every Day Smoker    Packs/day: 1.00    Years: 25.00    Types: Cigarettes  . Smokeless tobacco: Never Used  . Alcohol use Yes     Comment: SOCIAL  . Drug use: No  . Sexual activity: Not on file

## 2017-04-18 NOTE — Telephone Encounter (Signed)
Pt given order for compression hose.

## 2017-04-25 ENCOUNTER — Ambulatory Visit (INDEPENDENT_AMBULATORY_CARE_PROVIDER_SITE_OTHER): Payer: Medicare Other | Admitting: Orthopedic Surgery

## 2017-04-25 ENCOUNTER — Encounter (INDEPENDENT_AMBULATORY_CARE_PROVIDER_SITE_OTHER): Payer: Self-pay | Admitting: Orthopedic Surgery

## 2017-04-25 VITALS — Ht 68.0 in | Wt 180.0 lb

## 2017-04-25 DIAGNOSIS — S82842F Displaced bimalleolar fracture of left lower leg, subsequent encounter for open fracture type IIIA, IIIB, or IIIC with routine healing: Secondary | ICD-10-CM

## 2017-04-25 NOTE — Progress Notes (Signed)
   Office Visit Note   Patient: Austin Dillon           Date of Birth: [birth year]           MRN: [id number] Visit Date: 04/25/2017              Requested by: Orlena Sheldon, PA-C [address], [place] [address] PCP: Orlena Sheldon, PA-C  Chief Complaint  Patient presents with  . Right Ankle - Follow-up      HPI: Patient presents in follow-up status post a complex open fracture of the right ankle. The ischemic ulcer medially is improving he has no symptoms no side effects no pain. Currently wearing the medical compression stockings.  Assessment & Plan: Visit Diagnoses:  1. Open fracture ankle, bimalleolar, left, type III, with routine healing, subsequent encounter     Plan: Recommended continue with elevation minimize weightbearing use the walker and his fracture boot. Wear the medical compression stockings around-the-clock follow-up in the office in 2 weeks  Follow-Up Instructions: Return in about 2 weeks (around 05/09/2017).   Ortho Exam  Patient is alert, oriented, no adenopathy, well-dressed, normal affect, normal respiratory effort. Examination a lateral incision is well-healed he has no swelling the medial traumatic wound is showing some improvement with some small areas of granulation tissue approximately 10%. The ischemic areas are stable and not worsening. There is no cellulitis no drainage no odor.  Imaging: No results found.  Labs: No results found for: HGBA1C, ESRSEDRATE, CRP, LABURIC, REPTSTATUS, GRAMSTAIN, CULT, LABORGA  Orders:  No orders of the defined types were placed in this encounter.  No orders of the defined types were placed in this encounter.    Procedures: No procedures performed  Clinical Data: No additional findings.  ROS:  All other systems negative, except as noted in the HPI. Review of Systems  Objective: Vital Signs: Ht 5\' 8"  (1.727 m)   Wt 180 lb (81.6 kg)   BMI 27.37 kg/m   Specialty Comments:  No specialty  comments available.  PMFS History: Patient Active Problem List   Diagnosis Date Noted  . Open fracture ankle, bimalleolar, left, type III, with routine healing, subsequent encounter 04/04/2017  . Open ankle fracture, right, type I or II, initial encounter 03/26/2017  . Type III open fracture of right ankle 03/26/2017   Past Medical History:  Diagnosis Date  . Alzheimer disease   . Headache     History reviewed. No pertinent family history.  Past Surgical History:  Procedure Laterality Date  . APPENDECTOMY    . EXTERNAL FIXATION LEG Right 03/26/2017   Procedure: EXTERNAL FIXATION RIGHT LEG;  Surgeon: Mcarthur Rossetti, MD;  Location: [place];  Service: Orthopedics;  Laterality: Right;  . ORIF ANKLE FRACTURE Right 03/29/2017   Procedure: OPEN REDUCTION INTERNAL FIXATION (ORIF) RIGHT  ANKLE FRACTURE, Removal of External Fixator;  Surgeon: Newt Minion, MD;  Location: [place];  Service: Orthopedics;  Laterality: Right;   Social History   Occupational History  . Not on file.   Social History Main Topics  . Smoking status: Current Every Day Smoker    Packs/day: 1.00    Years: 25.00    Types: Cigarettes  . Smokeless tobacco: Never Used  . Alcohol use Yes     Comment: SOCIAL  . Drug use: No  . Sexual activity: Not on file

## 2017-05-04 ENCOUNTER — Telehealth (INDEPENDENT_AMBULATORY_CARE_PROVIDER_SITE_OTHER): Payer: Self-pay

## 2017-05-04 NOTE — Telephone Encounter (Signed)
Patient would like a call back concerning drainage that is coming through patient socks.  Patient is currently wearing Duda socks.  CB# is [phone]

## 2017-05-04 NOTE — Telephone Encounter (Signed)
I called and sw pt's wife and advised that a gauze could be applied to the outside of the sock to absorb extra drainage. Advised that the [facility] advised that she did not understand why the vive sock was being applied to the incision and that she did not think that this was an adequate dressing and that the pt should seek a second opinion with the wound care center. I advised the pt that the compression from the sock that is impregnated with silver that this is for healing and that this needs to be direct contact with the wound bed. Pt's wife voiced understanding and is comfortable with care. I sw [facility]  Nursing supervisor and advised of the conflict with the [facility] and the orders given. I advised that we are happy to discuss pt treatment and provide education and rationale for treatment choices at any time. We did not want to have a conflict with HHn and office and have the pt think that he was not getting appropriate care. Nursing supervisor apologized and advised that she would have a meeting with nursing staff and provide education for them and to encourage staff to call with questions ans concerns that we can work together for the pt as a team.

## 2017-05-09 ENCOUNTER — Ambulatory Visit (INDEPENDENT_AMBULATORY_CARE_PROVIDER_SITE_OTHER): Payer: Medicare Other | Admitting: Orthopedic Surgery

## 2017-05-09 ENCOUNTER — Encounter (INDEPENDENT_AMBULATORY_CARE_PROVIDER_SITE_OTHER): Payer: Self-pay | Admitting: Orthopedic Surgery

## 2017-05-09 VITALS — Ht 68.0 in | Wt 180.0 lb

## 2017-05-09 DIAGNOSIS — S82842F Displaced bimalleolar fracture of left lower leg, subsequent encounter for open fracture type IIIA, IIIB, or IIIC with routine healing: Secondary | ICD-10-CM

## 2017-05-09 NOTE — Progress Notes (Signed)
Office Visit Note   Patient: Austin Dillon           Date of Birth: 09-16-1951           MRN: [id number] Visit Date: 05/09/2017              Requested by: Orlena Sheldon, PA-C [address], [place] [address] PCP: Orlena Sheldon, PA-C  Chief Complaint  Patient presents with  . Right Ankle - Routine Post Op    03/29/17 right ankle ORIF      HPI: Patient is status post fracture dislocation right ankle with massive open traumatic wound medially. Patient has not been keeping his leg elevated as reported by the family and he does have increased swelling in the right lower extremity.  Assessment & Plan: Visit Diagnoses:  1. Open fracture ankle, bimalleolar, left, type III, with routine healing, subsequent encounter     Plan: Discussed the importance of elevation of his foot level with his heart discuss that if we cannot get enough swelling out of the foot and ankle he is at risk of wound breakdown and potential transtibial amputation. Patient states he understands. He will continue nonweightbearing with the walker continue with the medical compression sock to be worn 24 hours a day washes leg with soap and water daily.  Follow-Up Instructions: Return in about 2 weeks (around 05/23/2017).   Ortho Exam  Patient is alert, oriented, no adenopathy, well-dressed, normal affect, normal respiratory effort. Examination patient has pitting edema in the right lower extremity there is no cellulitis no signs of infection. Laterally the incision is healed well. Medially the traumatic wound shows an area of granulation tissue which is about 3 x 4 cm. Approximately half of the eschar has come off in the area of eschar now is about 1 cm in diameter. There is no exposed bone or tendon. There is approximately 50% granulation tissue 50% fibrinous exudative tissue. This is improving but I am quite concerned with the amount of swelling.  Imaging: No results found.  Labs: No results found  for: HGBA1C, ESRSEDRATE, CRP, LABURIC, REPTSTATUS, GRAMSTAIN, CULT, LABORGA  Orders:  No orders of the defined types were placed in this encounter.  No orders of the defined types were placed in this encounter.    Procedures: No procedures performed  Clinical Data: No additional findings.  ROS:  All other systems negative, except as noted in the HPI. Review of Systems  Objective: Vital Signs: Ht 5\' 8"  (1.727 m)   Wt 180 lb (81.6 kg)   BMI 27.37 kg/m   Specialty Comments:  No specialty comments available.  PMFS History: Patient Active Problem List   Diagnosis Date Noted  . Open fracture ankle, bimalleolar, left, type III, with routine healing, subsequent encounter 04/04/2017  . Open ankle fracture, right, type I or II, initial encounter 03/26/2017  . Type III open fracture of right ankle 03/26/2017   Past Medical History:  Diagnosis Date  . Alzheimer disease   . Headache     No family history on file.  Past Surgical History:  Procedure Laterality Date  . APPENDECTOMY    . EXTERNAL FIXATION LEG Right 03/26/2017   Procedure: EXTERNAL FIXATION RIGHT LEG;  Surgeon: Mcarthur Rossetti, MD;  Location: [place];  Service: Orthopedics;  Laterality: Right;  . ORIF ANKLE FRACTURE Right 03/29/2017   Procedure: OPEN REDUCTION INTERNAL FIXATION (ORIF) RIGHT  ANKLE FRACTURE, Removal of External Fixator;  Surgeon: Newt Minion,  MD;  Location: [place];  Service: Orthopedics;  Laterality: Right;   Social History   Occupational History  . Not on file.   Social History Main Topics  . Smoking status: Current Every Day Smoker    Packs/day: 1.00    Years: 25.00    Types: Cigarettes  . Smokeless tobacco: Never Used  . Alcohol use Yes     Comment: SOCIAL  . Drug use: No  . Sexual activity: Not on file

## 2017-05-23 ENCOUNTER — Encounter (INDEPENDENT_AMBULATORY_CARE_PROVIDER_SITE_OTHER): Payer: Self-pay | Admitting: Orthopedic Surgery

## 2017-05-23 ENCOUNTER — Ambulatory Visit (INDEPENDENT_AMBULATORY_CARE_PROVIDER_SITE_OTHER): Payer: Medicare Other | Admitting: Orthopedic Surgery

## 2017-05-23 VITALS — Ht 68.0 in | Wt 180.0 lb

## 2017-05-23 DIAGNOSIS — S82842F Displaced bimalleolar fracture of left lower leg, subsequent encounter for open fracture type IIIA, IIIB, or IIIC with routine healing: Secondary | ICD-10-CM

## 2017-05-23 NOTE — Progress Notes (Signed)
Office Visit Note   Patient: Austin Dillon           Date of Birth: [birth year]           MRN: [id number] Visit Date: 05/23/2017              Requested by: Orlena Sheldon, PA-C [address] [place], [place] [address] PCP: Madelyn Brunner, MD  Chief Complaint  Patient presents with  . Right Ankle - Routine Post Op    03/29/17 right ankle ORIF s/p fracture dislocation       HPI: Patient is a 66 year old gentleman who presents in follow-up for open fracture dislocation right ankle. He has been nonweightbearing using approximate 7 weeks out.   Assessment & Plan: Visit Diagnoses:  1. Open fracture ankle, bimalleolar, left, type III, with routine healing, subsequent encounter     Plan: Advance to weightbearing as tolerated in the fracture boot continue elevation continue to wear the medical compression sock. Photographs were obtained  Follow-Up Instructions: Return in about 2 weeks (around 06/06/2017).   Ortho Exam  Patient is alert, oriented, no adenopathy, well-dressed, normal affect, normal respiratory effort. Examination patient has had remarkable improvement of the ischemic ulcer medially with wearing the medical compression stocking. The ulcer currently measures 40 x 15 mm with 90% granulation tissue the wound is flat the depth has resolved significantly. There is approximately 0.1 mm deep. There is clear serosanguineous drainage no odor no cellulitis no signs of infection. There is no exposed bone or tendon.  Imaging: No results found.  Labs: No results found for: HGBA1C, ESRSEDRATE, CRP, LABURIC, REPTSTATUS, GRAMSTAIN, CULT, LABORGA  Orders:  No orders of the defined types were placed in this encounter.  No orders of the defined types were placed in this encounter.    Procedures: No procedures performed  Clinical Data: No additional findings.  ROS:  All other systems negative, except as noted in the HPI. Review of Systems  Objective: Vital Signs:  Ht 5\' 8"  (1.727 m)   Wt 180 lb (81.6 kg)   BMI 27.37 kg/m   Specialty Comments:  No specialty comments available.  PMFS History: Patient Active Problem List   Diagnosis Date Noted  . Open fracture ankle, bimalleolar, left, type III, with routine healing, subsequent encounter 04/04/2017  . Open ankle fracture, right, type I or II, initial encounter 03/26/2017  . Type III open fracture of right ankle 03/26/2017   Past Medical History:  Diagnosis Date  . Alzheimer disease   . Headache     No family history on file.  Past Surgical History:  Procedure Laterality Date  . APPENDECTOMY    . EXTERNAL FIXATION LEG Right 03/26/2017   Procedure: EXTERNAL FIXATION RIGHT LEG;  Surgeon: Mcarthur Rossetti, MD;  Location: [place];  Service: Orthopedics;  Laterality: Right;  . ORIF ANKLE FRACTURE Right 03/29/2017   Procedure: OPEN REDUCTION INTERNAL FIXATION (ORIF) RIGHT  ANKLE FRACTURE, Removal of External Fixator;  Surgeon: Newt Minion, MD;  Location: [place];  Service: Orthopedics;  Laterality: Right;   Social History   Occupational History  . Not on file.   Social History Main Topics  . Smoking status: Current Every Day Smoker    Packs/day: 1.00    Years: 25.00    Types: Cigarettes  . Smokeless tobacco: Never Used  . Alcohol use Yes     Comment: SOCIAL  . Drug use: No  . Sexual activity: Not on file

## 2017-06-06 ENCOUNTER — Ambulatory Visit (INDEPENDENT_AMBULATORY_CARE_PROVIDER_SITE_OTHER): Payer: Medicare Other | Admitting: Orthopedic Surgery

## 2017-06-06 ENCOUNTER — Encounter (INDEPENDENT_AMBULATORY_CARE_PROVIDER_SITE_OTHER): Payer: Self-pay | Admitting: Orthopedic Surgery

## 2017-06-06 VITALS — Ht 68.0 in | Wt 180.0 lb

## 2017-06-06 DIAGNOSIS — S82842F Displaced bimalleolar fracture of left lower leg, subsequent encounter for open fracture type IIIA, IIIB, or IIIC with routine healing: Secondary | ICD-10-CM

## 2017-06-06 NOTE — Progress Notes (Signed)
Office Visit Note   Patient: Austin Dillon           Date of Birth: Sep 02, 1951           MRN: [id number] Visit Date: 06/06/2017              Requested by: Madelyn Brunner, MD [facility] [place], [place] [address] PCP: Madelyn Brunner, MD  Chief Complaint  Patient presents with  . Right Ankle - Routine Post Op    03/29/17 right ankle ORIF s/p fx dislocation       HPI: Patient is 9 weeks status post open fracture dislocation right ankle with a massive area of ischemic tissue medially. Patient has been wearing the medical compression sock and is quite pleased with the healing. Patient is not on antibiotics he does complain of swelling.  Assessment & Plan: Visit Diagnoses:  1. Open fracture ankle, bimalleolar, left, type III, with routine healing, subsequent encounter     Plan: Patient will continue with the medical compression sock continue with her fracture boot continue with range of motion of the ankle change his socks daily patient states [place] going to the beach follow-up in 3 weeks with repeat 3 view radiographs of the right ankle  Follow-Up Instructions: Return in about 3 weeks (around 06/27/2017).   Ortho Exam  Patient is alert, oriented, no adenopathy, well-dressed, normal affect, normal respiratory effort. Examination patient has possibly 50% granulation tissue that was sent fibrinous tissue there is some scab around the wound edges. The wound measures 32 x 10 mm and 1 mm deep. There is no cellulitis there is some pitting edema no drainage no signs of infection. Patient does have discomfort with range of motion of his ankle.     Imaging: No results found.  Labs: No results found for: HGBA1C, ESRSEDRATE, CRP, LABURIC, REPTSTATUS, GRAMSTAIN, CULT, LABORGA  Orders:  No orders of the defined types were placed in this encounter.  No orders of the defined types were placed in this encounter.    Procedures: No procedures  performed  Clinical Data: No additional findings.  ROS:  All other systems negative, except as noted in the HPI. Review of Systems  Objective: Vital Signs: Ht 5\' 8"  (1.727 m)   Wt 180 lb (81.6 kg)   BMI 27.37 kg/m   Specialty Comments:  No specialty comments available.  PMFS History: Patient Active Problem List   Diagnosis Date Noted  . Open fracture ankle, bimalleolar, left, type III, with routine healing, subsequent encounter 04/04/2017  . Open ankle fracture, right, type I or II, initial encounter 03/26/2017  . Type III open fracture of right ankle 03/26/2017   Past Medical History:  Diagnosis Date  . Alzheimer disease   . Headache     No family history on file.  Past Surgical History:  Procedure Laterality Date  . APPENDECTOMY    . EXTERNAL FIXATION LEG Right 03/26/2017   Procedure: EXTERNAL FIXATION RIGHT LEG;  Surgeon: Mcarthur Rossetti, MD;  Location: [place];  Service: Orthopedics;  Laterality: Right;  . ORIF ANKLE FRACTURE Right 03/29/2017   Procedure: OPEN REDUCTION INTERNAL FIXATION (ORIF) RIGHT  ANKLE FRACTURE, Removal of External Fixator;  Surgeon: Newt Minion, MD;  Location: [place];  Service: Orthopedics;  Laterality: Right;   Social History   Occupational History  . Not on file.   Social History Main Topics  . Smoking status: Current Every Day Smoker  Packs/day: 1.00    Years: 25.00    Types: Cigarettes  . Smokeless tobacco: Never Used  . Alcohol use Yes     Comment: SOCIAL  . Drug use: No  . Sexual activity: Not on file

## 2017-06-27 ENCOUNTER — Ambulatory Visit (INDEPENDENT_AMBULATORY_CARE_PROVIDER_SITE_OTHER): Payer: Medicare Other | Admitting: Orthopedic Surgery

## 2017-06-27 ENCOUNTER — Encounter (INDEPENDENT_AMBULATORY_CARE_PROVIDER_SITE_OTHER): Payer: Self-pay | Admitting: Orthopedic Surgery

## 2017-06-27 ENCOUNTER — Ambulatory Visit (INDEPENDENT_AMBULATORY_CARE_PROVIDER_SITE_OTHER): Payer: Medicare Other

## 2017-06-27 VITALS — Ht 68.0 in | Wt 180.0 lb

## 2017-06-27 DIAGNOSIS — M25571 Pain in right ankle and joints of right foot: Secondary | ICD-10-CM

## 2017-06-27 DIAGNOSIS — S82841J Displaced bimalleolar fracture of right lower leg, subsequent encounter for open fracture type IIIA, IIIB, or IIIC with delayed healing: Secondary | ICD-10-CM

## 2017-06-27 NOTE — Progress Notes (Signed)
Office Visit Note   Patient: Austin Dillon           Date of Birth: 05-20-1951           MRN: [id number] Visit Date: 06/27/2017              Requested by: Madelyn Brunner, MD [facility] [place], [place] [address] PCP: Madelyn Brunner, MD  Chief Complaint  Patient presents with  . Right Ankle - Follow-up    03/29/17 right ankle ORIF s/p fracture dislocation       HPI: Patient presents 3 months status post open reduction internal fixation (type III the bimalleolar right ankle fracture. Patient is currently wearing a fracture boot is using the medical compression stocking and patient is pleased with his progress.  Assessment & Plan: Visit Diagnoses:  1. Pain in right ankle and joints of right foot   2. Open fracture ankle, bimalleolar, right, type III, with delayed healing, subsequent encounter     Plan: Patient given instructions to continue working on aggressive range of motion of the ankle especially dorsiflexion he may wean out of the fracture boot as he feels comfortable he may wean off the walker and use a cane as he feels comfortable.  Follow-up in 4 weeks with repeat 3 view radiographs of the right ankle.  Follow-Up Instructions: Return in about 3 weeks (around 07/18/2017).   Ortho Exam  Patient is alert, oriented, no adenopathy, well-dressed, normal affect, normal respiratory effort. Examination the ischemic area medially has completely healed there is a very small scab about 5 x 10 mm. There is no redness no cellulitis the ischemic soft tissue envelope has completely healed.  Imaging: No results found.  Labs: No results found for: HGBA1C, ESRSEDRATE, CRP, LABURIC, REPTSTATUS, GRAMSTAIN, CULT, LABORGA  Orders:  Orders Placed This Encounter  Procedures  . XR Ankle Complete Right   No orders of the defined types were placed in this encounter.    Procedures: No procedures performed  Clinical Data: No additional  findings.  ROS:  All other systems negative, except as noted in the HPI. Review of Systems  Objective: Vital Signs: Ht 5\' 8"  (1.727 m)   Wt 180 lb (81.6 kg)   BMI 27.37 kg/m   Specialty Comments:  No specialty comments available.  PMFS History: Patient Active Problem List   Diagnosis Date Noted  . Open fracture ankle, bimalleolar, right, type III, with delayed healing, subsequent encounter 04/04/2017  . Open ankle fracture, right, type I or II, initial encounter 03/26/2017  . Type III open fracture of right ankle 03/26/2017   Past Medical History:  Diagnosis Date  . Alzheimer disease   . Headache     No family history on file.  Past Surgical History:  Procedure Laterality Date  . APPENDECTOMY    . EXTERNAL FIXATION LEG Right 03/26/2017   Procedure: EXTERNAL FIXATION RIGHT LEG;  Surgeon: Mcarthur Rossetti, MD;  Location: [place] [place];  Service: Orthopedics;  Laterality: Right;  . ORIF ANKLE FRACTURE Right 03/29/2017   Procedure: OPEN REDUCTION INTERNAL FIXATION (ORIF) RIGHT  ANKLE FRACTURE, Removal of External Fixator;  Surgeon: Newt Minion, MD;  Location: [facility];  Service: Orthopedics;  Laterality: Right;   Social History   Occupational History  . Not on file.   Social History Main Topics  . Smoking status: Current Every Day Smoker    Packs/day: 1.00    Years: 25.00    Types:  Cigarettes  . Smokeless tobacco: Never Used  . Alcohol use Yes     Comment: SOCIAL  . Drug use: No  . Sexual activity: Not on file

## 2017-07-25 ENCOUNTER — Ambulatory Visit (INDEPENDENT_AMBULATORY_CARE_PROVIDER_SITE_OTHER): Payer: Medicare Other

## 2017-07-25 ENCOUNTER — Ambulatory Visit (INDEPENDENT_AMBULATORY_CARE_PROVIDER_SITE_OTHER): Payer: Medicare Other | Admitting: Orthopedic Surgery

## 2017-07-25 ENCOUNTER — Encounter (INDEPENDENT_AMBULATORY_CARE_PROVIDER_SITE_OTHER): Payer: Self-pay | Admitting: Orthopedic Surgery

## 2017-07-25 DIAGNOSIS — S82891B Other fracture of right lower leg, initial encounter for open fracture type I or II: Secondary | ICD-10-CM

## 2017-07-25 DIAGNOSIS — S82891F Other fracture of right lower leg, subsequent encounter for open fracture type IIIA, IIIB, or IIIC with routine healing: Secondary | ICD-10-CM | POA: Diagnosis not present

## 2017-07-25 DIAGNOSIS — S82841J Displaced bimalleolar fracture of right lower leg, subsequent encounter for open fracture type IIIA, IIIB, or IIIC with delayed healing: Secondary | ICD-10-CM

## 2017-07-25 NOTE — Progress Notes (Signed)
Office Visit Note   Patient: Austin Dillon           Date of Birth: 17-Nov-1951           MRN: [id number] Visit Date: 07/25/2017              Requested by: Madelyn Brunner, MD [facility] [place], [place] [address] PCP: Madelyn Brunner, MD  Chief Complaint  Patient presents with  . Right Ankle - Follow-up      HPI: Patient is 4 months status post open reduction internal fixation for an open fracture of the right ankle with extensive soft tissue damage over the medial malleolus. Patient states he occasionally has some heel pad pain at this time occasionally some anterior ankle pain.  Assessment & Plan: Visit Diagnoses:  1. Open ankle fracture, right, type I or II, initial encounter   2. Open fracture ankle, bimalleolar, right, type III, with delayed healing, subsequent encounter   3. Type III open fracture of right ankle with routine healing, subsequent encounter     Plan: Continue with the medical compression sock he may advance to regular sneakers around the house he will use a fracture boot outside the house he will advance to a cane in the opposite hand follow-up in 4 weeks with repeat 3 view radiographs of the right ankle at follow-up.  Follow-Up Instructions: Return in about 4 weeks (around 08/22/2017).   Ortho Exam  Patient is alert, oriented, no adenopathy, well-dressed, normal affect, normal respiratory effort. Examination the ischemic open wound over the medial malleolus has completely healed. Photographs were obtained. Patient is extremely pleased with his progress. There is no redness no cellulitis he does have decreased range of motion of the ankle and he is given instructions to work on ankle range of motion.  Imaging: Xr Ankle Complete Right  Result Date: 07/25/2017 Three-view radiographs of the right ankle shows stable healing of the bimalleolar fracture there is no hardware failure mortise is congruent.  No images are  attached to the encounter.  Labs: No results found for: HGBA1C, ESRSEDRATE, CRP, LABURIC, REPTSTATUS, GRAMSTAIN, CULT, LABORGA  Orders:  Orders Placed This Encounter  Procedures  . XR Ankle Complete Right   No orders of the defined types were placed in this encounter.    Procedures: No procedures performed  Clinical Data: No additional findings.  ROS:  All other systems negative, except as noted in the HPI. Review of Systems  Objective: Vital Signs: There were no vitals taken for this visit.  Specialty Comments:  No specialty comments available.  PMFS History: Patient Active Problem List   Diagnosis Date Noted  . Open fracture ankle, bimalleolar, right, type III, with delayed healing, subsequent encounter 04/04/2017  . Open ankle fracture, right, type I or II, initial encounter 03/26/2017  . Type III open fracture of right ankle 03/26/2017   Past Medical History:  Diagnosis Date  . Alzheimer disease   . Headache     No family history on file.  Past Surgical History:  Procedure Laterality Date  . APPENDECTOMY    . EXTERNAL FIXATION LEG Right 03/26/2017   Procedure: EXTERNAL FIXATION RIGHT LEG;  Surgeon: Mcarthur Rossetti, MD;  Location: [place];  Service: Orthopedics;  Laterality: Right;  . ORIF ANKLE FRACTURE Right 03/29/2017   Procedure: OPEN REDUCTION INTERNAL FIXATION (ORIF) RIGHT  ANKLE FRACTURE, Removal of External Fixator;  Surgeon: Newt Minion, MD;  Location: [place];  Service: Orthopedics;  Laterality: Right;   Social History   Occupational History  . Not on file.   Social History Main Topics  . Smoking status: Current Every Day Smoker    Packs/day: 1.00    Years: 25.00    Types: Cigarettes  . Smokeless tobacco: Never Used  . Alcohol use Yes     Comment: SOCIAL  . Drug use: No  . Sexual activity: Not on file

## 2017-07-27 NOTE — Addendum Note (Signed)
Addendum  created 07/27/17 1330 by Kearah Gayden, MD   Sign clinical note    

## 2017-08-14 ENCOUNTER — Telehealth (INDEPENDENT_AMBULATORY_CARE_PROVIDER_SITE_OTHER): Payer: Self-pay | Admitting: *Deleted

## 2017-08-14 NOTE — Telephone Encounter (Signed)
Pt wife called left message on triage phone stating pt foot is swollen and skin is opened up and has yellow oozing and can not bear weight. I C pt back after speaking with Lubertha Basque MD assistant to see if I could put pt on schedule today and she ok'd, I called the pt back sw wife and gave her a time for today to come in and she stated she could not come in today d/t pt has PT and she is on the way and could not make todays appt. I advised pt how important it was to come in d/t possible infection. Pt understood and wanted to try to come in tomorrow instead. Pt is schedule for tomorrow.

## 2017-08-15 ENCOUNTER — Ambulatory Visit (INDEPENDENT_AMBULATORY_CARE_PROVIDER_SITE_OTHER): Payer: Self-pay

## 2017-08-15 ENCOUNTER — Ambulatory Visit (INDEPENDENT_AMBULATORY_CARE_PROVIDER_SITE_OTHER): Payer: Medicare Other

## 2017-08-15 ENCOUNTER — Ambulatory Visit (INDEPENDENT_AMBULATORY_CARE_PROVIDER_SITE_OTHER): Payer: Medicare Other | Admitting: Orthopedic Surgery

## 2017-08-15 DIAGNOSIS — S82891B Other fracture of right lower leg, initial encounter for open fracture type I or II: Secondary | ICD-10-CM

## 2017-08-15 DIAGNOSIS — M25571 Pain in right ankle and joints of right foot: Secondary | ICD-10-CM

## 2017-08-15 DIAGNOSIS — M79671 Pain in right foot: Secondary | ICD-10-CM

## 2017-08-15 DIAGNOSIS — M25561 Pain in right knee: Secondary | ICD-10-CM

## 2017-08-15 MED ORDER — DOXYCYCLINE HYCLATE 100 MG PO TABS: 100.0000 mg | ORAL_TABLET | Freq: Two times a day (BID) | ORAL | 0 refills | Status: DC

## 2017-08-15 MED ORDER — MUPIROCIN 2 % EX OINT: 1.0000 "application " | TOPICAL_OINTMENT | Freq: Two times a day (BID) | CUTANEOUS | 3 refills | Status: DC

## 2017-08-15 NOTE — Addendum Note (Signed)
Addended by: Maxcine Ham on: 08/15/2017 04:40 PM   Modules accepted: Orders

## 2017-08-15 NOTE — Progress Notes (Signed)
Office Visit Note   Patient: Austin Dillon           Date of Birth: 1951-03-18           MRN: [id number] Visit Date: 08/15/2017              Requested by: Madelyn Brunner, MD [facility] [place], [place] [address] PCP: Madelyn Brunner, MD  Chief Complaint  Patient presents with  . Right Foot - Follow-up, Pain      HPI: Patient presents status post open reduction internal fixation for an open fracture dislocation of the right ankle. Patient has developed a new ulcer over the medial malleolus right ankle. Family feels this is from rubbing on the fracture boot.  Assessment & Plan: Visit Diagnoses:  1. Right knee pain, unspecified chronicity   2. Pain in right ankle and joints of right foot   3. Pain in right foot   4. Open ankle fracture, right, type I or II, initial encounter     Plan: Recommended discontinue the fracture boot. There was a very small abscess approximately 5 mm in diameter cultures were sent. Patient was started on doxycycline and Bactroban dressing changes recommended regular shoe wear to keep pressure off the medial malleolus follow-up in 1 week  Follow-Up Instructions: Return in about 1 week (around 08/22/2017).   Ortho Exam  Patient is alert, oriented, no adenopathy, well-dressed, normal affect, normal respiratory effort. Examination patient does have venous stasis swelling. He has a new small ulcer over the medial malleolus. There was approximately a 5 x 5 mm in diameter. There was a small purulent abscess. This was sent for cultures and is no ascending cellulitis. There is no dermatitis.  Imaging: Xr Ankle Complete Right  Result Date: 08/15/2017 Three-view radiographs of the right ankle show stable internal fixation no lytic changes of the medial malleolus no lucency around the screws.  Xr Foot 2 Views Right  Result Date: 08/15/2017 2 view radiographs of the right foot shows disuse osteoporosis no evidence of  infection or fractures.  No images are attached to the encounter.  Labs: No results found for: HGBA1C, ESRSEDRATE, CRP, LABURIC, REPTSTATUS, GRAMSTAIN, CULT, LABORGA  Orders:  Orders Placed This Encounter  Procedures  . XR Ankle Complete Right  . XR Foot 2 Views Right   Meds ordered this encounter  Medications  . doxycycline (VIBRA-TABS) 100 MG tablet    Sig: Take 1 tablet (100 mg total) by mouth 2 (two) times daily.    Dispense:  60 tablet    Refill:  0  . mupirocin ointment (BACTROBAN) 2 %    Sig: Apply 1 application topically 2 (two) times daily. Apply to the affected area 2 times a day    Dispense:  22 g    Refill:  3     Procedures: No procedures performed  Clinical Data: No additional findings.  ROS:  All other systems negative, except as noted in the HPI. Review of Systems  Objective: Vital Signs: There were no vitals taken for this visit.  Specialty Comments:  No specialty comments available.  PMFS History: Patient Active Problem List   Diagnosis Date Noted  . Open fracture ankle, bimalleolar, right, type III, with delayed healing, subsequent encounter 04/04/2017  . Open ankle fracture, right, type I or II, initial encounter 03/26/2017  . Type III open fracture of right ankle 03/26/2017   Past Medical History:  Diagnosis Date  . Alzheimer  disease   . Headache     No family history on file.  Past Surgical History:  Procedure Laterality Date  . APPENDECTOMY    . EXTERNAL FIXATION LEG Right 03/26/2017   Procedure: EXTERNAL FIXATION RIGHT LEG;  Surgeon: Mcarthur Rossetti, MD;  Location: [place];  Service: Orthopedics;  Laterality: Right;  . ORIF ANKLE FRACTURE Right 03/29/2017   Procedure: OPEN REDUCTION INTERNAL FIXATION (ORIF) RIGHT  ANKLE FRACTURE, Removal of External Fixator;  Surgeon: Newt Minion, MD;  Location: [facility];  Service: Orthopedics;  Laterality: Right;   Social History   Occupational History  . Not on file.   Social History Main  Topics  . Smoking status: Current Every Day Smoker    Packs/day: 1.00    Years: 25.00    Types: Cigarettes  . Smokeless tobacco: Never Used  . Alcohol use Yes     Comment: SOCIAL  . Drug use: No  . Sexual activity: Not on file

## 2017-08-21 LAB — BODY FLUID CULTURE

## 2017-08-22 ENCOUNTER — Ambulatory Visit (INDEPENDENT_AMBULATORY_CARE_PROVIDER_SITE_OTHER): Payer: Medicare Other | Admitting: Orthopedic Surgery

## 2017-08-22 ENCOUNTER — Encounter (INDEPENDENT_AMBULATORY_CARE_PROVIDER_SITE_OTHER): Payer: Self-pay | Admitting: Orthopedic Surgery

## 2017-08-22 DIAGNOSIS — S82841J Displaced bimalleolar fracture of right lower leg, subsequent encounter for open fracture type IIIA, IIIB, or IIIC with delayed healing: Secondary | ICD-10-CM | POA: Diagnosis not present

## 2017-08-22 NOTE — Progress Notes (Signed)
   Office Visit Note   Patient: Austin Dillon           Date of Birth: 1951/05/15           MRN: [id number] Visit Date: 08/22/2017              Requested by: Madelyn Brunner, MD [facility] [address] [place], [place] [address] PCP: Madelyn Brunner, MD  Chief Complaint  Patient presents with  . Right Ankle - Follow-up      HPI: Patient presents in follow-up status post a blister patient states developed from the fracture boot the cultures were positive for staph pansensitive and sensitive to tetracycline.  Assessment & Plan: Visit Diagnoses:  1. Open fracture ankle, bimalleolar, right, type III, with delayed healing, subsequent encounter     Plan: We will have the patient resume wearing the medical compression socks. They will place a small dab of Bactroban ointment over the wound. They will continue with the doxycycline. We will get him a new fracture boot to provide him stable support for ambulation and handicap parking  Follow-Up Instructions: Return in about 2 weeks (around 09/05/2017).   Ortho Exam  Patient is alert, oriented, no adenopathy, well-dressed, normal affect, normal respiratory effort. Examination the foot is plantar grade. There is no cellulitis or drainage from the wound. The wound is 5 mm in diameter 0.1 mm deep this is shown significant healing from the last examination.  Imaging: No results found. No images are attached to the encounter.  Labs: No results found for: HGBA1C, ESRSEDRATE, CRP, LABURIC, REPTSTATUS, GRAMSTAIN, CULT, LABORGA  Orders:  No orders of the defined types were placed in this encounter.  No orders of the defined types were placed in this encounter.    Procedures: No procedures performed  Clinical Data: No additional findings.  ROS:  All other systems negative, except as noted in the HPI. Review of Systems  Objective: Vital Signs: There were no vitals taken for this visit.  Specialty  Comments:  No specialty comments available.  PMFS History: Patient Active Problem List   Diagnosis Date Noted  . Open fracture ankle, bimalleolar, right, type III, with delayed healing, subsequent encounter 04/04/2017  . Open ankle fracture, right, type I or II, initial encounter 03/26/2017  . Type III open fracture of right ankle 03/26/2017   Past Medical History:  Diagnosis Date  . Alzheimer disease   . Headache     History reviewed. No pertinent family history.  Past Surgical History:  Procedure Laterality Date  . APPENDECTOMY    . EXTERNAL FIXATION LEG Right 03/26/2017   Procedure: EXTERNAL FIXATION RIGHT LEG;  Surgeon: Mcarthur Rossetti, MD;  Location: [facility] [facility];  Service: Orthopedics;  Laterality: Right;  . ORIF ANKLE FRACTURE Right 03/29/2017   Procedure: OPEN REDUCTION INTERNAL FIXATION (ORIF) RIGHT  ANKLE FRACTURE, Removal of External Fixator;  Surgeon: Newt Minion, MD;  Location: [facility];  Service: Orthopedics;  Laterality: Right;   Social History   Occupational History  . Not on file.   Social History Main Topics  . Smoking status: Current Every Day Smoker    Packs/day: 1.00    Years: 25.00    Types: Cigarettes  . Smokeless tobacco: Never Used  . Alcohol use Yes     Comment: SOCIAL  . Drug use: No  . Sexual activity: Not on file

## 2017-09-05 ENCOUNTER — Encounter (INDEPENDENT_AMBULATORY_CARE_PROVIDER_SITE_OTHER): Payer: Self-pay | Admitting: Orthopedic Surgery

## 2017-09-05 ENCOUNTER — Ambulatory Visit (INDEPENDENT_AMBULATORY_CARE_PROVIDER_SITE_OTHER): Payer: Medicare Other | Admitting: Orthopedic Surgery

## 2017-09-05 DIAGNOSIS — S82891B Other fracture of right lower leg, initial encounter for open fracture type I or II: Secondary | ICD-10-CM | POA: Diagnosis not present

## 2017-09-05 DIAGNOSIS — S82841J Displaced bimalleolar fracture of right lower leg, subsequent encounter for open fracture type IIIA, IIIB, or IIIC with delayed healing: Secondary | ICD-10-CM | POA: Diagnosis not present

## 2017-09-05 NOTE — Progress Notes (Signed)
Office Visit Note   Patient: Austin Dillon           Date of Birth: [birth year]           MRN: [id number] Visit Date: 09/05/2017              Requested by: Madelyn Brunner, MD [place] [facility] [place], [place] [address] PCP: Madelyn Brunner, MD  Chief Complaint  Patient presents with  . Right Ankle - Follow-up      HPI: Patient is a 66 year old gentleman who presents 6 months status post open reduction internal fixation open right ankle fracture. Patient complains of heel pain with weightbearing in the fracture boot. Patient has been using elastic bands to try to stretch his ankle. He still has swelling and has been wearing the medical compression stocking.  Assessment & Plan: Visit Diagnoses:  1. Open fracture ankle, bimalleolar, right, type III, with delayed healing, subsequent encounter   2. Open ankle fracture, right, type I or II, initial encounter     Plan: patient was given instructions and family was given instructions for proper heel cord stretching this was demonstrated to him. He'll Durene Fruits to regular sneakers and wear the compression stocking daily.  Follow-Up Instructions: Return in about 4 weeks (around 10/03/2017).   Ortho Exam  Patient is alert, oriented, no adenopathy, well-dressed, normal affect, normal respiratory effort. Examination patient has an antalgic gait. He has no pain to palpation over the fracture sites. The medial wound has completely healed there is no redness no cellulitis no drainage. Lateral incision is well-healed there is no pain with passive range of motion the ankle he does have significant heel cord contracture and dorsiflexion about 10 short of neutral with knee extended.  Imaging: No results found. No images are attached to the encounter.  Labs: No results found for: HGBA1C, ESRSEDRATE, CRP, LABURIC, REPTSTATUS, GRAMSTAIN, CULT, LABORGA  Orders:  No orders of the defined types were placed in this  encounter.  No orders of the defined types were placed in this encounter.    Procedures: No procedures performed  Clinical Data: No additional findings.  ROS:  All other systems negative, except as noted in the HPI. Review of Systems  Objective: Vital Signs: There were no vitals taken for this visit.  Specialty Comments:  No specialty comments available.  PMFS History: Patient Active Problem List   Diagnosis Date Noted  . Open fracture ankle, bimalleolar, right, type III, with delayed healing, subsequent encounter 04/04/2017  . Open ankle fracture, right, type I or II, initial encounter 03/26/2017  . Type III open fracture of right ankle 03/26/2017   Past Medical History:  Diagnosis Date  . Alzheimer disease   . Headache     No family history on file.  Past Surgical History:  Procedure Laterality Date  . APPENDECTOMY    . EXTERNAL FIXATION LEG Right 03/26/2017   Procedure: EXTERNAL FIXATION RIGHT LEG;  Surgeon: Mcarthur Rossetti, MD;  Location: [place];  Service: Orthopedics;  Laterality: Right;  . ORIF ANKLE FRACTURE Right 03/29/2017   Procedure: OPEN REDUCTION INTERNAL FIXATION (ORIF) RIGHT  ANKLE FRACTURE, Removal of External Fixator;  Surgeon: Newt Minion, MD;  Location: [place];  Service: Orthopedics;  Laterality: Right;   Social History   Occupational History  . Not on file.   Social History Main Topics  . Smoking status: Current Every Day Smoker    Packs/day: 1.00    Years:  25.00    Types: Cigarettes  . Smokeless tobacco: Never Used  . Alcohol use Yes     Comment: SOCIAL  . Drug use: No  . Sexual activity: Not on file

## 2017-09-11 ENCOUNTER — Ambulatory Visit (INDEPENDENT_AMBULATORY_CARE_PROVIDER_SITE_OTHER): Payer: Medicare Other | Admitting: Licensed Clinical Social Worker

## 2017-09-11 DIAGNOSIS — F411 Generalized anxiety disorder: Secondary | ICD-10-CM

## 2017-09-22 ENCOUNTER — Ambulatory Visit: Payer: Self-pay | Admitting: Licensed Clinical Social Worker
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
## 2017-10-03 ENCOUNTER — Encounter (INDEPENDENT_AMBULATORY_CARE_PROVIDER_SITE_OTHER): Payer: Self-pay | Admitting: Orthopedic Surgery

## 2017-10-03 ENCOUNTER — Ambulatory Visit (INDEPENDENT_AMBULATORY_CARE_PROVIDER_SITE_OTHER): Payer: Medicare Other | Admitting: Orthopedic Surgery

## 2017-10-03 ENCOUNTER — Ambulatory Visit (INDEPENDENT_AMBULATORY_CARE_PROVIDER_SITE_OTHER): Payer: Medicare Other | Admitting: Licensed Clinical Social Worker

## 2017-10-03 DIAGNOSIS — F411 Generalized anxiety disorder: Secondary | ICD-10-CM

## 2017-10-03 DIAGNOSIS — S82841J Displaced bimalleolar fracture of right lower leg, subsequent encounter for open fracture type IIIA, IIIB, or IIIC with delayed healing: Secondary | ICD-10-CM

## 2017-10-03 MED ORDER — CELECOXIB 200 MG PO CAPS: 200.0000 mg | ORAL_CAPSULE | Freq: Two times a day (BID) | ORAL | 3 refills | Status: DC

## 2017-10-03 NOTE — Addendum Note (Signed)
Addended by: Meridee Score on: 10/03/2017 01:27 PM   Modules accepted: Orders
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
## 2017-10-03 NOTE — Progress Notes (Signed)
Office Visit Note   Patient: Austin Dillon           Date of Birth: 05-23-1951           MRN: [id number] Visit Date: 10/03/2017              Requested by: Madelyn Brunner, MD [facility] [place], [place] [address] PCP: Madelyn Brunner, MD  Chief Complaint  Patient presents with  . Right Ankle - Follow-up      HPI: Patient presents approximately 6 months status post open reduction internal fixation for open fracture dislocation of the right ankle.  Patient is currently working with physical therapy for heel cord stretching ambulating with a walker with regular shoe wear currently wearing compression stockings.  Patient states that he still has pain despite his narcotics pain medicine.  Assessment & Plan: Visit Diagnoses:  1. Open fracture ankle, bimalleolar, right, type III, with delayed healing, subsequent encounter     Plan: Recommended discontinuing the Mobic we will try Celebrex 200 mg twice a day for 2 weeks and then as needed.  Discussed the importance of full weightbearing discussed the importance of range of motion exercises such as a stationary or recumbent bicycle.  Reevaluate in 4 weeks with repeat three-view radiographs of the right ankle.  Follow-Up Instructions: Return in about 4 weeks (around 10/31/2017).   Ortho Exam  Patient is alert, oriented, no adenopathy, well-dressed, normal affect, normal respiratory effort. Examination patient still has swelling of the right lower extremity there is no tenderness no signs of DVT.  He does have decreased range of motion of the ankle.  The traumatic medial wound over the medial malleolus has completely healed with excellent skin growth.  There is no redness no cellulitis no tenderness to palpation no signs of infection.  Picture was obtained.  Imaging: No results found. No images are attached to the encounter.  Labs: No results found for: HGBA1C, ESRSEDRATE, CRP, LABURIC, REPTSTATUS,  GRAMSTAIN, CULT, LABORGA  Orders:  No orders of the defined types were placed in this encounter.  No orders of the defined types were placed in this encounter.    Procedures: No procedures performed  Clinical Data: No additional findings.  ROS:  All other systems negative, except as noted in the HPI. Review of Systems  Objective: Vital Signs: There were no vitals taken for this visit.  Specialty Comments:  No specialty comments available.  PMFS History: Patient Active Problem List   Diagnosis Date Noted  . Open fracture ankle, bimalleolar, right, type III, with delayed healing, subsequent encounter 04/04/2017  . Open ankle fracture, right, type I or II, initial encounter 03/26/2017  . Type III open fracture of right ankle 03/26/2017   Past Medical History:  Diagnosis Date  . Alzheimer disease   . Headache     History reviewed. No pertinent family history.  Past Surgical History:  Procedure Laterality Date  . APPENDECTOMY    . EXTERNAL FIXATION LEG Right 03/26/2017   Procedure: EXTERNAL FIXATION RIGHT LEG;  Surgeon: Mcarthur Rossetti, MD;  Location: [facility];  Service: Orthopedics;  Laterality: Right;  . ORIF ANKLE FRACTURE Right 03/29/2017   Procedure: OPEN REDUCTION INTERNAL FIXATION (ORIF) RIGHT  ANKLE FRACTURE, Removal of External Fixator;  Surgeon: Newt Minion, MD;  Location: [facility];  Service: Orthopedics;  Laterality: Right;   Social History   Occupational History  . Not on file.   Social History Main Topics  .  Smoking status: Current Every Day Smoker    Packs/day: 1.00    Years: 25.00    Types: Cigarettes  . Smokeless tobacco: Never Used  . Alcohol use Yes     Comment: SOCIAL  . Drug use: No  . Sexual activity: Not on file

## 2017-10-03 NOTE — Addendum Note (Signed)
Addended by: Meridee Score on: 10/03/2017 01:12 PM   Modules accepted: Orders

## 2017-10-31 ENCOUNTER — Ambulatory Visit (INDEPENDENT_AMBULATORY_CARE_PROVIDER_SITE_OTHER): Payer: Medicare Other | Admitting: Orthopedic Surgery

## 2017-10-31 ENCOUNTER — Encounter (INDEPENDENT_AMBULATORY_CARE_PROVIDER_SITE_OTHER): Payer: Self-pay | Admitting: Orthopedic Surgery

## 2017-10-31 ENCOUNTER — Ambulatory Visit (INDEPENDENT_AMBULATORY_CARE_PROVIDER_SITE_OTHER): Payer: Medicare Other

## 2017-10-31 VITALS — Ht 68.0 in | Wt 180.0 lb

## 2017-10-31 DIAGNOSIS — M25571 Pain in right ankle and joints of right foot: Secondary | ICD-10-CM

## 2017-10-31 DIAGNOSIS — S82841J Displaced bimalleolar fracture of right lower leg, subsequent encounter for open fracture type IIIA, IIIB, or IIIC with delayed healing: Secondary | ICD-10-CM

## 2017-10-31 NOTE — Progress Notes (Signed)
xr right ankle  

## 2017-10-31 NOTE — Progress Notes (Signed)
Office Visit Note   Patient: Austin Dillon           Date of Birth: July 01, 1951           MRN: [id number] Visit Date: 10/31/2017              Requested by: Madelyn Brunner, MD [facility] [place], [place] [address] PCP: Madelyn Brunner, MD  Chief Complaint  Patient presents with  . Right Ankle - Pain    Right open bimal type III fx 10 weeks out      HPI: Patient is a 66 year old gentleman who presents status post open treatment after external fixation for an open type III a ankle fracture.  Patient has been nonweightbearing on his foot despite recommendations for full weightbearing.  Patient has been working with physical therapy at home twice a week.  Patient is 7 months out from his initial surgery.  Assessment & Plan: Visit Diagnoses:  1. Pain in right ankle and joints of right foot   2. Open fracture ankle, bimalleolar, right, type III, with delayed healing, subsequent encounter     Plan: Weightbearing to restore his bone mineral density and to restore function to his foot and ankle.  Discussed that if patient is unable to proceed with weightbearing due to pain that options would include either an ankle fusion or tibial calcaneal fusion.  Follow-Up Instructions: Return in about 2 months (around 12/31/2017).   Ortho Exam  Patient is alert, oriented, no adenopathy, well-dressed, normal affect, normal respiratory effort. On examination patient will not weight-bear on his foot with ambulation.  The traumatic wound medially is well-healed there is no redness no cellulitis there is complete epithelialization there is no signs of infection.  Patient has only about 10 degrees range of motion of the ankle he resists any movement.  He has good subtalar motion with 10 degrees of inversion and eversion.  He has a palpable pulse.  He has no pain with range of motion through the midfoot and forefoot.  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  The fractures have healed.  There is a collapse across the tibiotalar joint with traumatic arthritis.  No images are attached to the encounter.  Labs: No results found for: HGBA1C, ESRSEDRATE, CRP, LABURIC, REPTSTATUS, GRAMSTAIN, CULT, LABORGA  @LABSALLVALUES (HGBA1)@  @BMI1 @  Orders:  Orders Placed This Encounter  Procedures  . XR Ankle Complete Right   No orders of the defined types were placed in this encounter.    Procedures: No procedures performed  Clinical Data: No additional findings.  ROS:  All other systems negative, except as noted in the HPI. Review of Systems  Objective: Vital Signs: Ht 5\' 8"  (1.727 m)   Wt 180 lb (81.6 kg)   BMI 27.37 kg/m   Specialty Comments:  No specialty comments available.  PMFS History: Patient Active Problem List   Diagnosis Date Noted  . Open fracture ankle, bimalleolar, right, type III, with delayed healing, subsequent encounter 04/04/2017  . Open ankle fracture, right, type I or II, initial encounter 03/26/2017  . Type III open fracture of right ankle 03/26/2017   Past Medical History:  Diagnosis Date  . Alzheimer disease   . Headache     History reviewed. No pertinent family history.  Past Surgical  History:  Procedure Laterality Date  . APPENDECTOMY    . EXTERNAL FIXATION LEG Right 03/26/2017   Procedure: EXTERNAL FIXATION RIGHT LEG;  Surgeon: Mcarthur Rossetti, MD;  Location: [facility];  Service: Orthopedics;  Laterality: Right;  . ORIF ANKLE FRACTURE Right 03/29/2017   Procedure: OPEN REDUCTION INTERNAL FIXATION (ORIF) RIGHT  ANKLE FRACTURE, Removal of External Fixator;  Surgeon: Newt Minion, MD;  Location: [facility];  Service: Orthopedics;  Laterality: Right;   Social History   Occupational History  . Not on file    Tobacco Use  . Smoking status: Current Every Day Smoker    Packs/day: 1.00    Years: 25.00    Pack years: 25.00    Types: Cigarettes  . Smokeless tobacco: Never Used  Substance and Sexual Activity  . Alcohol use: Yes    Comment: SOCIAL  . Drug use: No  . Sexual activity: Not on file

## 2017-11-01 DIAGNOSIS — M818 Other osteoporosis without current pathological fracture: Secondary | ICD-10-CM | POA: Insufficient documentation

## 2017-11-06 ENCOUNTER — Telehealth (INDEPENDENT_AMBULATORY_CARE_PROVIDER_SITE_OTHER): Payer: Self-pay | Admitting: *Deleted

## 2017-11-06 NOTE — Telephone Encounter (Signed)
I called and sw pt's wife. Advise he needs to be seen on office. appt with [facility] tomorrow at 2:45

## 2017-11-06 NOTE — Telephone Encounter (Signed)
Pt wife called really concerned about pt and his foot states he was in here about a week ago for pain in ankle right and stated pt has been weightbearing all week since they left office but now is extremely swollen bigger then what it was when he came in and hurts says "feels like pins when he steps" and has some redness. Wife feels it may be infected again like before.   States that Dr. Sharol Given had mentioned if pt was unable to weightbear due to pain then would proceed with other options. Pt would like a call back ASAP.   CB [phone]

## 2017-11-07 ENCOUNTER — Ambulatory Visit (INDEPENDENT_AMBULATORY_CARE_PROVIDER_SITE_OTHER): Payer: Medicare Other | Admitting: Orthopaedic Surgery

## 2017-11-07 ENCOUNTER — Encounter (INDEPENDENT_AMBULATORY_CARE_PROVIDER_SITE_OTHER): Payer: Self-pay | Admitting: Orthopaedic Surgery

## 2017-11-07 VITALS — Ht 68.0 in | Wt 180.0 lb

## 2017-11-07 DIAGNOSIS — S82842F Displaced bimalleolar fracture of left lower leg, subsequent encounter for open fracture type IIIA, IIIB, or IIIC with routine healing: Secondary | ICD-10-CM

## 2017-11-07 DIAGNOSIS — M7989 Other specified soft tissue disorders: Secondary | ICD-10-CM

## 2017-11-07 NOTE — Progress Notes (Signed)
Office Visit Note   Patient: Austin Dillon           Date of Birth: 1951-05-08           MRN: [id number] Visit Date: 11/07/2017              Requested by: Madelyn Brunner, MD [facility] [place], [place] [address] PCP: Madelyn Brunner, MD   Assessment & Plan: Visit Diagnoses:  1. Right leg swelling   2. Open fracture ankle, bimalleolar, left, type III, with routine healing, subsequent encounter     Plan: Impression is right foot and ankle swelling consistent with postoperative course.  Doppler ordered to rule out DVT as reassurance to the patient.  Follow-up as needed.  We will notify the patient the results.  Follow-Up Instructions: Return if symptoms worsen or fail to improve.   Orders:  No orders of the defined types were placed in this encounter.  No orders of the defined types were placed in this encounter.     Procedures: No procedures performed   Clinical Data: No additional findings.   Subjective: Chief Complaint  Patient presents with  . Right Ankle - Pain    Post surgical external fixation open type III fx    Patient is 7 months status post ORIF right open bimalleolar ankle fracture.  He comes in with recent history of swelling of his right lower leg and foot and ankle.  He states that this swelling has gotten significantly better.  His skin did become red temporarily.  He has pain with touch in the ankle region.  Denies any numbness and tingling.    Review of Systems  Constitutional: Negative.   All other systems reviewed and are negative.    Objective: Vital Signs: Ht 5\' 8"  (1.727 m)   Wt 180 lb (81.6 kg)   BMI 27.37 kg/m   Physical Exam  Constitutional: He is oriented to person, place, and time. He appears well-developed and well-nourished.  Pulmonary/Chest: Effort normal.  Abdominal: Soft.  Neurological: He is alert and oriented to person, place, and time.  Skin: Skin is warm.  Psychiatric: He has a  normal mood and affect. His behavior is normal. Judgment and thought content normal.  Nursing note and vitals reviewed.   Ortho Exam Right ankle exam shows well-healed surgical scar.  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  He has moderate swelling of his ankle and foot.  Calf is nontender and nonswollen. Specialty Comments:  No specialty comments available.  Imaging: No results found.   PMFS History: Patient Active Problem List   Diagnosis Date Noted  . Open fracture ankle, bimalleolar, right, type III, with delayed healing, subsequent encounter 04/04/2017  . Open ankle fracture, right, type I or II, initial encounter 03/26/2017  . Type III open fracture of right ankle 03/26/2017   Past Medical History:  Diagnosis Date  . Alzheimer disease   . Headache     No family history on file.  Past Surgical History:  Procedure Laterality Date  . APPENDECTOMY    . EXTERNAL FIXATION LEG Right 03/26/2017   Procedure: EXTERNAL FIXATION RIGHT LEG;  Surgeon: Mcarthur Rossetti, MD;  Location: [place];  Service: Orthopedics;  Laterality: Right;  . ORIF ANKLE FRACTURE Right 03/29/2017   Procedure: OPEN REDUCTION INTERNAL FIXATION (ORIF) RIGHT  ANKLE FRACTURE, Removal of External Fixator;  Surgeon: Newt Minion, MD;  Location: [place];  Service: Orthopedics;  Laterality: Right;  Social History   Occupational History  . Not on file  Tobacco Use  . Smoking status: Current Every Day Smoker    Packs/day: 1.00    Years: 25.00    Pack years: 25.00    Types: Cigarettes  . Smokeless tobacco: Never Used  Substance and Sexual Activity  . Alcohol use: Yes    Comment: SOCIAL  . Drug use: No  . Sexual activity: Not on file

## 2017-11-08 ENCOUNTER — Ambulatory Visit (HOSPITAL_COMMUNITY): Admission: RE | Admit: 2017-11-08 | Payer: Medicare Other | Source: Ambulatory Visit

## 2017-11-09 ENCOUNTER — Ambulatory Visit (HOSPITAL_COMMUNITY)
Admission: RE | Admit: 2017-11-09 | Discharge: 2017-11-09 | Disposition: A | Discharge status: Home / Self Care | Payer: Medicare Other | Source: Ambulatory Visit | Attending: Orthopaedic Surgery | Admitting: Orthopaedic Surgery

## 2017-11-09 ENCOUNTER — Telehealth (INDEPENDENT_AMBULATORY_CARE_PROVIDER_SITE_OTHER): Payer: Self-pay

## 2017-11-09 DIAGNOSIS — R590 Localized enlarged lymph nodes: Secondary | ICD-10-CM | POA: Diagnosis not present

## 2017-11-09 DIAGNOSIS — M7989 Other specified soft tissue disorders: Secondary | ICD-10-CM | POA: Diagnosis present

## 2017-11-09 NOTE — Telephone Encounter (Signed)
Austin Dillon from [facility] vascular lab called to let Dr Erlinda Hong know patient doppler was negative for DVT. She was going to let the patient go. Asked for patient to be called with further instructions.

## 2017-11-09 NOTE — Telephone Encounter (Signed)
Called to advise on message.  

## 2017-11-09 NOTE — Telephone Encounter (Signed)
Ok thanks.  Please let him know.

## 2017-11-09 NOTE — Progress Notes (Signed)
RLE venous duplex prelim: negative DVT. Landry Mellow, RDMS, RVT  Called results to Dr. Erlinda Hong office.

## 2017-11-09 NOTE — Telephone Encounter (Signed)
FYI

## 2017-12-11 ENCOUNTER — Other Ambulatory Visit: Payer: Self-pay | Admitting: Neurology

## 2017-12-11 DIAGNOSIS — R413 Other amnesia: Secondary | ICD-10-CM

## 2017-12-12 ENCOUNTER — Ambulatory Visit (INDEPENDENT_AMBULATORY_CARE_PROVIDER_SITE_OTHER): Payer: Medicare Other | Admitting: Orthopedic Surgery

## 2017-12-12 ENCOUNTER — Encounter (INDEPENDENT_AMBULATORY_CARE_PROVIDER_SITE_OTHER): Payer: Self-pay | Admitting: Orthopedic Surgery

## 2017-12-12 VITALS — Ht 68.0 in | Wt 180.0 lb

## 2017-12-12 DIAGNOSIS — S82841J Displaced bimalleolar fracture of right lower leg, subsequent encounter for open fracture type IIIA, IIIB, or IIIC with delayed healing: Secondary | ICD-10-CM | POA: Diagnosis not present

## 2017-12-12 NOTE — Progress Notes (Signed)
Office Visit Note   Patient: Austin Dillon           Date of Birth: 1951-05-23           MRN: [id number] Visit Date: 12/12/2017              Requested by: Madelyn Brunner, MD No address on file PCP: Katheren Shams  Chief Complaint  Patient presents with  . Right Ankle - Follow-up    Right ankle open bimal type III 15 weeks out       HPI: Patient presents in follow-up status post open bimalleolar ankle fracture on the right type III a.  Patient has had an ultrasound last month that was negative for DVT.  Patient has completed home health physical therapy and the family wants him to pursue outpatient physical therapy.  Patient states he feels that he has needles sticking into his foot with weightbearing.  Patient is currently wearing a medical compression stocking.  Assessment & Plan: Visit Diagnoses:  1. Open fracture ankle, bimalleolar, right, type III, with delayed healing, subsequent encounter     Plan: Patient will continue with the medical compression stocking he has excellent improvement of the swelling.  He is given a physical therapy prescription for [facility].  Continue to increase his activities as tolerated.  Follow-Up Instructions: Return in about 4 weeks (around 01/09/2018).   Ortho Exam  Patient is alert, oriented, no adenopathy, well-dressed, normal affect, normal respiratory effort. Examination patient has an antalgic gait he is using crutches.  He has dorsiflexion ankle to neutral plantar flexion about 30 degrees inversion and eversion of about 10 degrees.  The swelling continues to decrease the medial and lateral incisions have healed quite nicely the massive traumatic wound with necrotic tissue has completely healed over the medial malleolus.  There is no redness no cellulitis no signs of infection there is no tenderness to palpation.  Imaging: No results found. No images are attached to the encounter.  Labs: No results found for:  HGBA1C, ESRSEDRATE, CRP, LABURIC, REPTSTATUS, GRAMSTAIN, CULT, LABORGA  @LABSALLVALUES (HGBA1)@  Body mass index is 27.37 kg/m.  Orders:  No orders of the defined types were placed in this encounter.  No orders of the defined types were placed in this encounter.    Procedures: No procedures performed  Clinical Data: No additional findings.  ROS:  All other systems negative, except as noted in the HPI. Review of Systems  Objective: Vital Signs: Ht 5\' 8"  (1.727 m)   Wt 180 lb (81.6 kg)   BMI 27.37 kg/m   Specialty Comments:  No specialty comments available.  PMFS History: Patient Active Problem List   Diagnosis Date Noted  . Open fracture ankle, bimalleolar, right, type III, with delayed healing, subsequent encounter 04/04/2017  . Open ankle fracture, right, type I or II, initial encounter 03/26/2017  . Type III open fracture of right ankle 03/26/2017   Past Medical History:  Diagnosis Date  . Alzheimer disease   . Headache     History reviewed. No pertinent family history.  Past Surgical History:  Procedure Laterality Date  . APPENDECTOMY    . EXTERNAL FIXATION LEG Right 03/26/2017   Procedure: EXTERNAL FIXATION RIGHT LEG;  Surgeon: Mcarthur Rossetti, MD;  Location: [place];  Service: Orthopedics;  Laterality: Right;  . ORIF ANKLE FRACTURE Right 03/29/2017   Procedure: OPEN REDUCTION INTERNAL FIXATION (ORIF) RIGHT  ANKLE FRACTURE, Removal of External Fixator;  Surgeon: Newt Minion, MD;  Location: [place];  Service: Orthopedics;  Laterality: Right;   Social History   Occupational History  . Not on file  Tobacco Use  . Smoking status: Current Every Day Smoker    Packs/day: 1.00    Years: 25.00    Pack years: 25.00    Types: Cigarettes  . Smokeless tobacco: Never Used  Substance and Sexual Activity  . Alcohol use: Yes    Comment: SOCIAL  . Drug use: No  . Sexual activity: Not on file

## 2017-12-15 ENCOUNTER — Ambulatory Visit (HOSPITAL_COMMUNITY): Payer: Medicare Other

## 2017-12-18 ENCOUNTER — Ambulatory Visit (HOSPITAL_COMMUNITY): Payer: Medicare Other

## 2017-12-21 ENCOUNTER — Ambulatory Visit (HOSPITAL_COMMUNITY)
Admission: RE | Admit: 2017-12-21 | Discharge: 2017-12-21 | Disposition: A | Discharge status: Home / Self Care | Payer: Medicare Other | Source: Ambulatory Visit | Attending: Neurology | Admitting: Neurology

## 2017-12-21 DIAGNOSIS — R413 Other amnesia: Secondary | ICD-10-CM | POA: Diagnosis not present

## 2017-12-21 DIAGNOSIS — G3189 Other specified degenerative diseases of nervous system: Secondary | ICD-10-CM | POA: Diagnosis not present

## 2018-01-01 ENCOUNTER — Other Ambulatory Visit: Payer: Self-pay | Admitting: Internal Medicine

## 2018-01-01 DIAGNOSIS — Z87891 Personal history of nicotine dependence: Secondary | ICD-10-CM

## 2018-01-01 DIAGNOSIS — I714 Abdominal aortic aneurysm, without rupture, unspecified: Secondary | ICD-10-CM

## 2018-01-09 ENCOUNTER — Ambulatory Visit (INDEPENDENT_AMBULATORY_CARE_PROVIDER_SITE_OTHER): Payer: Medicare Other

## 2018-01-09 ENCOUNTER — Ambulatory Visit (INDEPENDENT_AMBULATORY_CARE_PROVIDER_SITE_OTHER): Payer: Medicare Other | Admitting: Orthopedic Surgery

## 2018-01-09 ENCOUNTER — Encounter (INDEPENDENT_AMBULATORY_CARE_PROVIDER_SITE_OTHER): Payer: Self-pay | Admitting: Orthopedic Surgery

## 2018-01-09 DIAGNOSIS — S93411A Sprain of calcaneofibular ligament of right ankle, initial encounter: Secondary | ICD-10-CM | POA: Diagnosis not present

## 2018-01-09 DIAGNOSIS — S82841J Displaced bimalleolar fracture of right lower leg, subsequent encounter for open fracture type IIIA, IIIB, or IIIC with delayed healing: Secondary | ICD-10-CM

## 2018-01-09 DIAGNOSIS — L97311 Non-pressure chronic ulcer of right ankle limited to breakdown of skin: Secondary | ICD-10-CM | POA: Diagnosis not present

## 2018-01-09 NOTE — Progress Notes (Signed)
Office Visit Note   Patient: Austin Dillon           Date of Birth: Apr 21, 1951           MRN: [id number] Visit Date: 01/09/2018              Requested by: Austin Dillon, Austin Dillon, Austin Dillon PCP: Austin Dillon  Chief Complaint  Patient presents with  . Right Ankle - Pain, Follow-up      HPI: Patient is a 67 year old gentleman who is status post open reduction internal fixation of the right ankle.  Patient states that he tripped and fell this past Sunday night he is currently using his crutches.  Patient also reports that he was using a heating pad set it on high left and on his ankle to normally developed a blister over the medial malleolus.  Discussed the importance of not using a heating pad on the foot and ankle.  Continue with the crutches weightbearing as tolerated.  Assessment & Plan: Visit Diagnoses:  1. Open fracture ankle, bimalleolar, right, type III, with delayed healing, subsequent encounter   2. Sprain of calcaneofibular ligament of right ankle, initial encounter   3. Non-pressure chronic ulcer of right ankle limited to breakdown of skin ([place])     Plan: He is to use the medical compression sock daily and change this daily.  Follow-Up Instructions: Return in about 4 weeks (around 02/06/2018).   Ortho Exam  Patient is alert, oriented, no adenopathy, well-dressed, normal affect, normal respiratory effort. Examination patient has a superficial blister over the medial malleolus from the heating pad.  There is no redness no cellulitis no signs of infection the blister is 10 mm in diameter 0.1 mm deep with good granulation tissue.  Patient has tenderness to palpation of the anterior talofibular ligament.  Anterior drawer is stable there is no crepitation with range of motion of his ankle he is tender to palpation anteriorly consistent with his traumatic arthritis.  Imaging: Xr Ankle Complete Right  Result Date: 01/09/2018 3  view radiographs of the right ankle shows stable internal fixation no hardware failure.  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  No images are attached to the encounter.  Labs: No results found for: HGBA1C, ESRSEDRATE, CRP, LABURIC, REPTSTATUS, GRAMSTAIN, CULT, LABORGA  @LABSALLVALUES (HGBA1)@  There is no height or weight on file to calculate BMI.  Orders:  Orders Placed This Encounter  Procedures  . XR Ankle Complete Right   No orders of the defined types were placed in this encounter.    Procedures: No procedures performed  Clinical Data: No additional findings.  ROS:  All other systems negative, except as noted in the HPI. Review of Systems  Objective: Vital Signs: There were no vitals taken for this visit.  Specialty Comments:  No specialty comments available.  PMFS History: Patient Active Problem List   Diagnosis Date Noted  . Open fracture ankle, bimalleolar, right, type III, with delayed healing, subsequent encounter 04/04/2017  . Open ankle fracture, right, type I or II, initial encounter 03/26/2017  . Type III open fracture of right ankle 03/26/2017   Past Medical History:  Diagnosis Date  . Alzheimer disease   . Headache     No family history on file.  Past Surgical History:  Procedure Laterality Date  . APPENDECTOMY    . EXTERNAL FIXATION LEG  Right 03/26/2017   Procedure: EXTERNAL FIXATION RIGHT LEG;  Surgeon: Austin Rossetti, MD;  Location: [place];  Service: Orthopedics;  Laterality: Right;  . ORIF ANKLE FRACTURE Right 03/29/2017   Procedure: OPEN REDUCTION INTERNAL FIXATION (ORIF) RIGHT  ANKLE FRACTURE, Removal of External Fixator;  Surgeon: Austin Minion, MD;  Location: [place];  Service: Orthopedics;  Laterality: Right;   Social History   Occupational History  . Not on file  Tobacco Use  . Smoking  status: Current Every Day Smoker    Packs/day: 1.00    Years: 25.00    Pack years: 25.00    Types: Cigarettes  . Smokeless tobacco: Never Used  Substance and Sexual Activity  . Alcohol use: Yes    Comment: SOCIAL  . Drug use: No  . Sexual activity: Not on file

## 2018-01-12 ENCOUNTER — Emergency Department
Admission: EM | Admit: 2018-01-12 | Discharge: 2018-01-12 | Disposition: A | Discharge status: Other Acute Inpatient Hospital | Payer: Medicare Other | Attending: Emergency Medicine | Admitting: Emergency Medicine

## 2018-01-12 ENCOUNTER — Encounter (HOSPITAL_COMMUNITY): Payer: Self-pay | Admitting: Family Medicine

## 2018-01-12 ENCOUNTER — Emergency Department: Payer: Medicare Other

## 2018-01-12 ENCOUNTER — Inpatient Hospital Stay (HOSPITAL_COMMUNITY)
Admission: AD | Admit: 2018-01-12 | Discharge: 2018-01-17 | DRG: 470 | Disposition: A | Discharge status: Home / Self Care | Payer: Medicare Other | Source: Other Acute Inpatient Hospital | Attending: Internal Medicine | Admitting: Internal Medicine

## 2018-01-12 DIAGNOSIS — Y92009 Unspecified place in unspecified non-institutional (private) residence as the place of occurrence of the external cause: Secondary | ICD-10-CM | POA: Insufficient documentation

## 2018-01-12 DIAGNOSIS — G309 Alzheimer's disease, unspecified: Secondary | ICD-10-CM | POA: Diagnosis present

## 2018-01-12 DIAGNOSIS — R52 Pain, unspecified: Secondary | ICD-10-CM

## 2018-01-12 DIAGNOSIS — F17210 Nicotine dependence, cigarettes, uncomplicated: Secondary | ICD-10-CM | POA: Diagnosis present

## 2018-01-12 DIAGNOSIS — Z419 Encounter for procedure for purposes other than remedying health state, unspecified: Secondary | ICD-10-CM

## 2018-01-12 DIAGNOSIS — S72001A Fracture of unspecified part of neck of right femur, initial encounter for closed fracture: Secondary | ICD-10-CM | POA: Diagnosis present

## 2018-01-12 DIAGNOSIS — Y92019 Unspecified place in single-family (private) house as the place of occurrence of the external cause: Secondary | ICD-10-CM

## 2018-01-12 DIAGNOSIS — W1830XA Fall on same level, unspecified, initial encounter: Secondary | ICD-10-CM | POA: Diagnosis present

## 2018-01-12 DIAGNOSIS — Y9389 Activity, other specified: Secondary | ICD-10-CM | POA: Insufficient documentation

## 2018-01-12 DIAGNOSIS — S72011A Unspecified intracapsular fracture of right femur, initial encounter for closed fracture: Secondary | ICD-10-CM | POA: Insufficient documentation

## 2018-01-12 DIAGNOSIS — S82844B Nondisplaced bimalleolar fracture of right lower leg, initial encounter for open fracture type I or II: Secondary | ICD-10-CM | POA: Diagnosis present

## 2018-01-12 DIAGNOSIS — Z888 Allergy status to other drugs, medicaments and biological substances status: Secondary | ICD-10-CM

## 2018-01-12 DIAGNOSIS — R5082 Postprocedural fever: Secondary | ICD-10-CM | POA: Diagnosis not present

## 2018-01-12 DIAGNOSIS — Z791 Long term (current) use of non-steroidal anti-inflammatories (NSAID): Secondary | ICD-10-CM

## 2018-01-12 DIAGNOSIS — Z79899 Other long term (current) drug therapy: Secondary | ICD-10-CM | POA: Diagnosis not present

## 2018-01-12 DIAGNOSIS — Z7982 Long term (current) use of aspirin: Secondary | ICD-10-CM | POA: Diagnosis not present

## 2018-01-12 DIAGNOSIS — S72044A Nondisplaced fracture of base of neck of right femur, initial encounter for closed fracture: Secondary | ICD-10-CM | POA: Diagnosis not present

## 2018-01-12 DIAGNOSIS — Y998 Other external cause status: Secondary | ICD-10-CM | POA: Diagnosis not present

## 2018-01-12 DIAGNOSIS — F419 Anxiety disorder, unspecified: Secondary | ICD-10-CM | POA: Diagnosis present

## 2018-01-12 DIAGNOSIS — R509 Fever, unspecified: Secondary | ICD-10-CM | POA: Diagnosis not present

## 2018-01-12 DIAGNOSIS — F418 Other specified anxiety disorders: Secondary | ICD-10-CM | POA: Diagnosis present

## 2018-01-12 DIAGNOSIS — F0280 Dementia in other diseases classified elsewhere without behavioral disturbance: Secondary | ICD-10-CM | POA: Diagnosis present

## 2018-01-12 DIAGNOSIS — Z96641 Presence of right artificial hip joint: Secondary | ICD-10-CM

## 2018-01-12 DIAGNOSIS — W010XXA Fall on same level from slipping, tripping and stumbling without subsequent striking against object, initial encounter: Secondary | ICD-10-CM | POA: Insufficient documentation

## 2018-01-12 DIAGNOSIS — D62 Acute posthemorrhagic anemia: Secondary | ICD-10-CM | POA: Diagnosis not present

## 2018-01-12 DIAGNOSIS — S82841J Displaced bimalleolar fracture of right lower leg, subsequent encounter for open fracture type IIIA, IIIB, or IIIC with delayed healing: Secondary | ICD-10-CM | POA: Diagnosis not present

## 2018-01-12 DIAGNOSIS — S79911A Unspecified injury of right hip, initial encounter: Secondary | ICD-10-CM | POA: Diagnosis present

## 2018-01-12 DIAGNOSIS — W19XXXA Unspecified fall, initial encounter: Secondary | ICD-10-CM

## 2018-01-12 DIAGNOSIS — M25551 Pain in right hip: Secondary | ICD-10-CM | POA: Diagnosis present

## 2018-01-12 DIAGNOSIS — D649 Anemia, unspecified: Secondary | ICD-10-CM | POA: Diagnosis not present

## 2018-01-12 LAB — CBC WITH DIFFERENTIAL/PLATELET
Basophils Absolute: 0 10*3/uL (ref 0–0.1)
Basophils Relative: 0 %
Eosinophils Absolute: 0 10*3/uL (ref 0–0.7)
Eosinophils Relative: 0 %
HCT: 43.4 % (ref 40.0–52.0)
Hemoglobin: 15.1 g/dL (ref 13.0–18.0)
Lymphocytes Relative: 6 %
Lymphs Abs: 0.8 10*3/uL — ABNORMAL LOW (ref 1.0–3.6)
MCH: 33.8 pg (ref 26.0–34.0)
MCHC: 34.7 g/dL (ref 32.0–36.0)
MCV: 97.4 fL (ref 80.0–100.0)
Monocytes Absolute: 0.6 10*3/uL (ref 0.2–1.0)
Monocytes Relative: 4 %
Neutro Abs: 11.8 10*3/uL — ABNORMAL HIGH (ref 1.4–6.5)
Neutrophils Relative %: 90 %
Platelets: 220 10*3/uL (ref 150–440)
RBC: 4.46 MIL/uL (ref 4.40–5.90)
RDW: 12.7 % (ref 11.5–14.5)
WBC: 13.2 10*3/uL — ABNORMAL HIGH (ref 3.8–10.6)

## 2018-01-12 LAB — BASIC METABOLIC PANEL
ANION GAP: 9 (ref 5–15)
BUN: 7 mg/dL (ref 6–20)
CO2: 26 mmol/L (ref 22–32)
Calcium: 9.4 mg/dL (ref 8.9–10.3)
Chloride: 99 mmol/L — ABNORMAL LOW (ref 101–111)
Creatinine, Ser: 0.89 mg/dL (ref 0.61–1.24)
GFR calc Af Amer: >60 mL/min (ref >60)
GLUCOSE: 126 mg/dL — AB (ref 65–99)
POTASSIUM: 4.1 mmol/L (ref 3.5–5.1)
Sodium: 134 mmol/L — ABNORMAL LOW (ref 135–145)

## 2018-01-12 LAB — TYPE AND SCREEN
ABO/RH(D): O POS
Antibody Screen: NEGATIVE

## 2018-01-12 LAB — ABO/RH: ABO/RH(D): O POS

## 2018-01-12 MED ORDER — LAMOTRIGINE 100 MG PO TABS
150.0000 mg | ORAL_TABLET | Freq: Every day | ORAL | Status: DC
  Administered 2018-01-12 – 2018-01-16 (×5): 150 mg via ORAL
  Filled 2018-01-12 (×5): qty 2

## 2018-01-12 MED ORDER — OXYCODONE HCL 5 MG PO TABS
5.0000 mg | ORAL_TABLET | ORAL | Status: DC | PRN
  Administered 2018-01-13 – 2018-01-17 (×13): 10 mg via ORAL
  Filled 2018-01-12 (×16): qty 2

## 2018-01-12 MED ORDER — ESCITALOPRAM OXALATE 10 MG PO TABS: 10.0000 mg | ORAL_TABLET | Freq: Every day | ORAL | Status: DC

## 2018-01-12 MED ORDER — DULOXETINE HCL 20 MG PO CPEP
20.0000 mg | ORAL_CAPSULE | Freq: Every day | ORAL | Status: DC
  Administered 2018-01-14 – 2018-01-17 (×4): 20 mg via ORAL
  Filled 2018-01-12 (×5): qty 1

## 2018-01-12 MED ORDER — ALPRAZOLAM 0.5 MG PO TABS
2.0000 mg | ORAL_TABLET | Freq: Three times a day (TID) | ORAL | Status: DC | PRN
  Administered 2018-01-13 – 2018-01-17 (×7): 2 mg via ORAL
  Filled 2018-01-12 (×7): qty 4

## 2018-01-12 MED ORDER — SENNOSIDES-DOCUSATE SODIUM 8.6-50 MG PO TABS: 1.0000 | ORAL_TABLET | Freq: Every evening | ORAL | Status: DC | PRN

## 2018-01-12 MED ORDER — LAMOTRIGINE 100 MG PO TABS
100.0000 mg | ORAL_TABLET | Freq: Every day | ORAL | Status: DC
  Administered 2018-01-14 – 2018-01-17 (×4): 100 mg via ORAL
  Filled 2018-01-12 (×4): qty 1

## 2018-01-12 MED ORDER — VITAMIN D 1000 UNITS PO TABS
1000.0000 [IU] | ORAL_TABLET | Freq: Every day | ORAL | Status: DC
  Administered 2018-01-12 – 2018-01-16 (×5): 1000 [IU] via ORAL
  Filled 2018-01-12 (×5): qty 1

## 2018-01-12 MED ORDER — ONDANSETRON HCL 4 MG/2ML IJ SOLN
INTRAMUSCULAR | Status: AC
  Administered 2018-01-12: 18:00:00
  Filled 2018-01-12: qty 2

## 2018-01-12 MED ORDER — POVIDONE-IODINE 10 % EX SWAB: 2.0000 "application " | Freq: Once | CUTANEOUS | Status: DC

## 2018-01-12 MED ORDER — DOXYCYCLINE HYCLATE 100 MG PO TABS: 100.0000 mg | ORAL_TABLET | Freq: Two times a day (BID) | ORAL | Status: DC

## 2018-01-12 MED ORDER — HYDROMORPHONE HCL 1 MG/ML IJ SOLN
1.0000 mg | Freq: Once | INTRAMUSCULAR | Status: AC
  Administered 2018-01-12: 1 mg via INTRAVENOUS
  Filled 2018-01-12: qty 1

## 2018-01-12 MED ORDER — BUTALBITAL-APAP-CAFFEINE 50-325-40 MG PO TABS: 1.0000 | ORAL_TABLET | Freq: Every day | ORAL | Status: DC | PRN

## 2018-01-12 MED ORDER — BISACODYL 5 MG PO TBEC
5.0000 mg | DELAYED_RELEASE_TABLET | Freq: Every day | ORAL | Status: DC | PRN
  Filled 2018-01-12: qty 1

## 2018-01-12 MED ORDER — CEFAZOLIN SODIUM-DEXTROSE 2-4 GM/100ML-% IV SOLN
2.0000 g | INTRAVENOUS | Status: AC
  Administered 2018-01-13: 2 g via INTRAVENOUS
  Filled 2018-01-12: qty 100

## 2018-01-12 MED ORDER — CHLORHEXIDINE GLUCONATE 4 % EX LIQD
60.0000 mL | Freq: Once | CUTANEOUS | Status: AC
  Administered 2018-01-13: 4 via TOPICAL
  Filled 2018-01-12: qty 60

## 2018-01-12 MED ORDER — METHOCARBAMOL 500 MG PO TABS
500.0000 mg | ORAL_TABLET | Freq: Four times a day (QID) | ORAL | Status: DC | PRN
  Administered 2018-01-13 – 2018-01-17 (×9): 500 mg via ORAL
  Filled 2018-01-12 (×9): qty 1

## 2018-01-12 MED ORDER — MUPIROCIN 2 % EX OINT
1.0000 "application " | TOPICAL_OINTMENT | Freq: Two times a day (BID) | CUTANEOUS | Status: DC
  Administered 2018-01-12 – 2018-01-16 (×8): 1 via TOPICAL
  Filled 2018-01-12 (×3): qty 22

## 2018-01-12 MED ORDER — SODIUM CHLORIDE 0.9 % IV SOLN
INTRAVENOUS | Status: AC
  Administered 2018-01-12 – 2018-01-13 (×2): via INTRAVENOUS

## 2018-01-12 MED ORDER — METAXALONE 800 MG PO TABS
800.0000 mg | ORAL_TABLET | Freq: Four times a day (QID) | ORAL | Status: DC
  Filled 2018-01-12 (×4): qty 1

## 2018-01-12 MED ORDER — MORPHINE SULFATE (PF) 2 MG/ML IV SOLN
1.0000 mg | INTRAVENOUS | Status: DC | PRN
  Administered 2018-01-12: 2 mg via INTRAVENOUS
  Filled 2018-01-12: qty 1

## 2018-01-12 MED ORDER — HYDROMORPHONE HCL 1 MG/ML IJ SOLN
1.0000 mg | INTRAMUSCULAR | Status: DC | PRN
  Administered 2018-01-12 – 2018-01-16 (×10): 1 mg via INTRAVENOUS
  Filled 2018-01-12 (×10): qty 1

## 2018-01-12 MED ORDER — MORPHINE SULFATE (PF) 4 MG/ML IV SOLN
INTRAVENOUS | Status: AC
  Administered 2018-01-12: 18:00:00
  Filled 2018-01-12: qty 1

## 2018-01-12 NOTE — ED Notes (Signed)
This RN gave report to [facility] with carelink at this time.

## 2018-01-12 NOTE — ED Notes (Signed)
[facility] RN called our secretary to have patient dc so they can add them in on their side.  Primary RN has been unable to dc d/t emergency.

## 2018-01-12 NOTE — ED Provider Notes (Signed)
[facility] Emergency Department Provider Note  ____________________________________________   I have reviewed the triage vital signs and the nursing notes.   HISTORY  Chief Complaint Right hip pain  History limited by: Not Limited   HPI Austin Dillon is a 67 y.o. male who presents to the emergency department today because of right hip pain. He states the pain started after a fall yesterday. Fell onto his right side. It happened when his crutch went out from under him. With the help of his wife he was able to get back to the cough. He stayed on the cough all night. Today the pain had continued. It is severe. He denies any head injury.   Per medical record review patient has a history of left ankle fracture.   Past Medical History:  Diagnosis Date  . Alzheimer disease   . Headache     Patient Active Problem List   Diagnosis Date Noted  . Open fracture ankle, bimalleolar, right, type III, with delayed healing, subsequent encounter 04/04/2017  . Open ankle fracture, right, type I or II, initial encounter 03/26/2017  . Type III open fracture of right ankle 03/26/2017    Past Surgical History:  Procedure Laterality Date  . APPENDECTOMY    . EXTERNAL FIXATION LEG Right 03/26/2017   Procedure: EXTERNAL FIXATION RIGHT LEG;  Surgeon: Mcarthur Rossetti, MD;  Location: [place];  Service: Orthopedics;  Laterality: Right;  . ORIF ANKLE FRACTURE Right 03/29/2017   Procedure: OPEN REDUCTION INTERNAL FIXATION (ORIF) RIGHT  ANKLE FRACTURE, Removal of External Fixator;  Surgeon: Newt Minion, MD;  Location: [place];  Service: Orthopedics;  Laterality: Right;    Prior to Admission medications   Medication Sig Start Date End Date Taking? Authorizing Provider  alprazolam Duanne Moron) 2 MG tablet Take 2 mg by mouth 3 (three) times daily as needed for anxiety.    [provider]  aspirin EC 81 MG tablet Take 81 mg by mouth at bedtime.     [provider]   butalbital-acetaminophen-caffeine (FIORICET, ESGIC) 50-325-40 MG tablet Take 1 tablet by mouth daily as needed for headache.    [provider]  celecoxib (CELEBREX) 200 MG capsule Take 1 capsule (200 mg total) by mouth 2 (two) times daily. 10/03/17   Newt Minion, MD  Cholecalciferol (VITAMIN D-3 PO) Take 1 capsule by mouth at bedtime.    [provider]  doxycycline (VIBRA-TABS) 100 MG tablet Take 1 tablet (100 mg total) by mouth 2 (two) times daily. 08/15/17   Newt Minion, MD  DULoxetine (CYMBALTA) 20 MG capsule Take 20 mg by mouth daily. 08/29/16   [provider]  escitalopram (LEXAPRO) 10 MG tablet Take 1 tablet (10 mg total) by mouth daily. 04/26/13   Orlena Sheldon, PA-C  lamoTRIgine (LAMICTAL) 100 MG tablet Take 100-150 mg by mouth See admin instructions. 100 mg in the morning and 150 mg in the evening    [provider]  meloxicam (MOBIC) 7.5 MG tablet Take 1 tablet (7.5 mg total) by mouth daily. 04/26/13   Orlena Sheldon, PA-C  metaxalone (SKELAXIN) 800 MG tablet Take 1 tablet (800 mg total) by mouth 4 (four) times daily. 04/26/13   Orlena Sheldon, PA-C  mupirocin ointment (BACTROBAN) 2 % Apply 1 application topically 2 (two) times daily. Apply to the affected area 2 times a day 08/15/17   Newt Minion, MD  oxyCODONE-acetaminophen (ROXICET) 5-325 MG tablet Take 1 tablet by mouth every  4 (four) hours as needed for severe pain. 03/31/17   Newt Minion, MD    Allergies Nortriptyline  No family history on file.  Social History Social History   Tobacco Use  . Smoking status: Current Every Day Smoker    Packs/day: 1.00    Years: 25.00    Pack years: 25.00    Types: Cigarettes  . Smokeless tobacco: Never Used  Substance Use Topics  . Alcohol use: Yes    Comment: SOCIAL  . Drug use: No    Review of Systems Constitutional: No fever/chills Eyes: No visual changes. ENT: No sore throat. Cardiovascular: Denies chest pain. Respiratory: Denies  shortness of breath. Gastrointestinal: No abdominal pain.  No nausea, no vomiting.  No diarrhea.  Genitourinary: Negative for dysuria. Musculoskeletal: Positive for right hip pain. Skin: Negative for rash. Neurological: Negative for headaches, focal weakness or numbness.  ____________________________________________   PHYSICAL EXAM:  VITAL SIGNS: ED Triage Vitals  Enc Vitals Group     BP 01/12/18 1611 (!) 150/84     Pulse Rate 01/12/18 1611 72     Resp 01/12/18 1611 19     Temp 01/12/18 1611 98.1 F (36.7 C)     Temp Source 01/12/18 1611 Oral     SpO2 01/12/18 1611 97 %     Weight 01/12/18 1610 180 lb (81.6 kg)     Height 01/12/18 1610 5\' 8"  (1.727 m)     Head Circumference --      Peak Flow --      Pain Score 01/12/18 1610 6   Constitutional: Alert and oriented. Well appearing and in no distress. Eyes: Conjunctivae are normal.  ENT   Head: Normocephalic and atraumatic.   Nose: No congestion/rhinnorhea.   Mouth/Throat: Mucous membranes are moist.   Neck: No stridor. Hematological/Lymphatic/Immunilogical: No cervical lymphadenopathy. Cardiovascular: Normal rate, regular rhythm.  No murmurs, rubs, or gallops.  Respiratory: Normal respiratory effort without tachypnea nor retractions. Breath sounds are clear and equal bilaterally. No wheezes/rales/rhonchi. Gastrointestinal: Soft and non tender. No rebound. No guarding.  Genitourinary: Deferred Musculoskeletal: Right hip tender to manipulation. Neurologic:  Normal speech and language. No gross focal neurologic deficits are appreciated.  Skin:  Skin tear to right forearm.  Psychiatric: Mood and affect are normal. Speech and behavior are normal. Patient exhibits appropriate insight and judgment.  ____________________________________________    LABS (pertinent positives/negatives)  CBC wbc 13.2, hgb 15.1, plt 220 BMP na 134, glu  126 ____________________________________________   EKG  None  ____________________________________________    RADIOLOGY  None  ____________________________________________   PROCEDURES  Procedures  ____________________________________________   INITIAL IMPRESSION / ASSESSMENT AND PLAN / ED COURSE  Pertinent labs & imaging results that were available during my care of the patient were reviewed by me and considered in my medical decision making (see chart for details).  Patient presented to the emergency department today because of right hip pain after a fall.  X-rays are consistent with a right hip fracture.  Patient did request transfer to [facility] in [place] where he can be seen by his already established orthopedic doctor.  Did discuss with both orthopedic doctor on call as well as hospitalist at [facility] and they can accept patient in transfer.   ____________________________________________   FINAL CLINICAL IMPRESSION(S) / ED DIAGNOSES  Final diagnoses:  Fall, initial encounter  Closed fracture of right hip, initial encounter [facility])     Note: This dictation was prepared with Dragon dictation. Any transcriptional errors that result from this  process are unintentional     Nance Pear, MD 01/12/18 [phone]

## 2018-01-12 NOTE — Progress Notes (Signed)
Patient arrived to unit in NAD. VS stable. Admission MD paged for admission orders.

## 2018-01-12 NOTE — ED Notes (Signed)
Pt verbalized and understood the transfer. Pt wife requested pt be transported. EDP and RN were present for transfer verbal consent.

## 2018-01-12 NOTE — H&P (Signed)
History and Physical    Austin Dillon JSH:[id number] DOB: 1951/05/06 DOA: 01/12/2018  PCP: Baxter Hire, MD   Patient coming from: Home, by way of [place]   Chief Complaint: Right hip pain after a fall   HPI: Austin Dillon is a 67 y.o. male with medical history significant for Alzheimer's disease, chronic headaches, depression with anxiety, and open bimalleolar right ankle fracture last spring status post ORIF, now presenting to the emergency department for evaluation of severe right hip pain after a fall at home.  Patient has delayed wound healing at the right ankle, continues to follow with orthopedic surgery, and continues to ambulate with crutches.  He reports that last night, the rubber foot came off of 1 of his crutches causing him to fall onto his right side.  He experienced immediate and severe pain at the right hip and required assistance from his wife to make it over to the couch where he slept and then spent the day today.   ED Course: Upon arrival to the ED, patient is found to be afebrile, saturating well on room air, and with pulse otherwise normal.  Chest x-rays negative for acute cardiopulmonary disease, chemistry panel reveals mild hyponatremia, CBC is notable for a leukocytosis to 13,200, and radiographs of the right hip demonstrate a displaced fracture of the right femoral neck.  Orthopedic surgery was consulted by the ED physician and requested a medical admission to [facility].  Patient was provided analgesia, remained hemodynamically stable and in no apparent respiratory distress, and will be admitted to the medical-surgical unit for ongoing evaluation and management of right hip fracture.  Review of Systems:  All other systems reviewed and apart from HPI, are negative.  Past Medical History:  Diagnosis Date  . Alzheimer disease   . Headache     Past Surgical History:  Procedure Laterality Date  . APPENDECTOMY    . EXTERNAL FIXATION LEG Right  03/26/2017   Procedure: EXTERNAL FIXATION RIGHT LEG;  Surgeon: Mcarthur Rossetti, MD;  Location: [place];  Service: Orthopedics;  Laterality: Right;  . ORIF ANKLE FRACTURE Right 03/29/2017   Procedure: OPEN REDUCTION INTERNAL FIXATION (ORIF) RIGHT  ANKLE FRACTURE, Removal of External Fixator;  Surgeon: Newt Minion, MD;  Location: [place];  Service: Orthopedics;  Laterality: Right;     reports that he has been smoking cigarettes.  He has a 25.00 pack-year smoking history. he has never used smokeless tobacco. He reports that he drinks alcohol. He reports that he does not use drugs.  Allergies  Allergen Reactions  . Nortriptyline Other (See Comments)    Severe mood swings    History reviewed. No pertinent family history.   Prior to Admission medications   Medication Sig Start Date End Date Taking? Authorizing Provider  alprazolam Duanne Moron) 2 MG tablet Take 2 mg by mouth 3 (three) times daily as needed for anxiety.    [provider]  aspirin EC 81 MG tablet Take 81 mg by mouth at bedtime.     [provider]  butalbital-acetaminophen-caffeine (FIORICET, ESGIC) 50-325-40 MG tablet Take 1 tablet by mouth daily as needed for headache.    [provider]  celecoxib (CELEBREX) 200 MG capsule Take 1 capsule (200 mg total) by mouth 2 (two) times daily. 10/03/17   Newt Minion, MD  Cholecalciferol (VITAMIN D-3 PO) Take 1 capsule by mouth at bedtime.    [provider]  doxycycline (VIBRA-TABS) 100 MG tablet Take 1 tablet (  100 mg total) by mouth 2 (two) times daily. 08/15/17   Newt Minion, MD  DULoxetine (CYMBALTA) 20 MG capsule Take 20 mg by mouth daily. 08/29/16   [provider]  escitalopram (LEXAPRO) 10 MG tablet Take 1 tablet (10 mg total) by mouth daily. 04/26/13   Orlena Sheldon, PA-C  lamoTRIgine (LAMICTAL) 100 MG tablet Take 100-150 mg by mouth See admin instructions. 100 mg in the morning and 150 mg in the evening    [provider]    meloxicam (MOBIC) 7.5 MG tablet Take 1 tablet (7.5 mg total) by mouth daily. 04/26/13   Orlena Sheldon, PA-C  metaxalone (SKELAXIN) 800 MG tablet Take 1 tablet (800 mg total) by mouth 4 (four) times daily. 04/26/13   Orlena Sheldon, PA-C  mupirocin ointment (BACTROBAN) 2 % Apply 1 application topically 2 (two) times daily. Apply to the affected area 2 times a day 08/15/17   Newt Minion, MD  oxyCODONE-acetaminophen (ROXICET) 5-325 MG tablet Take 1 tablet by mouth every 4 (four) hours as needed for severe pain. 03/31/17   Newt Minion, MD    Physical Exam: Vitals:   01/12/18 1920  BP: (!) 152/76  Pulse: 66  Temp: 99.2 F (37.3 C)  TempSrc: Oral  SpO2: 98%      Constitutional: NAD, calm  Eyes: PERTLA, lids and conjunctivae normal ENMT: Mucous membranes are moist. Posterior pharynx clear of any exudate or lesions.   Neck: normal, supple, no masses, no thyromegaly Respiratory: clear to auscultation bilaterally, no wheezing, no crackles. Normal respiratory effort.   Cardiovascular: S1 & S2 heard, regular rate and rhythm. No significant JVD. Abdomen: No distension, no tenderness, no masses palpated. Bowel sounds normal.  Musculoskeletal: no clubbing / cyanosis. Right hip exquisitely tender. Muscle atrophy involving right leg.  Skin: Medial right ankle with small wound draining scant serosanguinous fluid with surrounding crust. Skin is otherwise warm, dry, well-perfused. Neurologic: CN 2-12 grossly intact. Sensation intact. Strength 5/5 in all 4 limbs.  Psychiatric: Alert and oriented to person, place, and situation. Pleasant and cooperative.     Labs on Admission: I have personally reviewed following labs and imaging studies  CBC: Recent Labs  Lab 01/12/18 1614  WBC 13.2*  NEUTROABS 11.8*  HGB 15.1  HCT 43.4  MCV 97.4  PLT 222   Basic Metabolic Panel: Recent Labs  Lab 01/12/18 1614  NA 134*  K 4.1  CL 99*  CO2 26  GLUCOSE 126*  BUN 7  CREATININE 0.89  CALCIUM 9.4    GFR: Estimated Creatinine Clearance: 79 mL/min (by C-G formula based on SCr of 0.89 mg/dL). Liver Function Tests: No results for input(s): AST, ALT, ALKPHOS, BILITOT, PROT, ALBUMIN in the last 168 hours. No results for input(s): LIPASE, AMYLASE in the last 168 hours. No results for input(s): AMMONIA in the last 168 hours. Coagulation Profile: No results for input(s): INR, PROTIME in the last 168 hours. Cardiac Enzymes: No results for input(s): CKTOTAL, CKMB, CKMBINDEX, TROPONINI in the last 168 hours. BNP (last 3 results) No results for input(s): PROBNP in the last 8760 hours. HbA1C: No results for input(s): HGBA1C in the last 72 hours. CBG: No results for input(s): GLUCAP in the last 168 hours. Lipid Profile: No results for input(s): CHOL, HDL, LDLCALC, TRIG, CHOLHDL, LDLDIRECT in the last 72 hours. Thyroid Function Tests: No results for input(s): TSH, T4TOTAL, FREET4, T3FREE, THYROIDAB in the last 72 hours. Anemia Panel: No results for input(s): VITAMINB12, FOLATE, FERRITIN, TIBC,  IRON, RETICCTPCT in the last 72 hours. Urine analysis: No results found for: COLORURINE, APPEARANCEUR, LABSPEC, PHURINE, GLUCOSEU, HGBUR, BILIRUBINUR, KETONESUR, PROTEINUR, UROBILINOGEN, NITRITE, LEUKOCYTESUR Sepsis Labs: @LABRCNTIP (procalcitonin:4,lacticidven:4) )No results found for this or any previous visit (from the past 240 hour(s)).   Radiological Exams on Admission: Dg Chest 1 View  Result Date: 01/12/2018 CLINICAL DATA:  Fall, right hip pain. EXAM: CHEST 1 VIEW COMPARISON:  None. FINDINGS: Heart size and mediastinal contours are within normal limits. Lungs are clear. No pleural effusion or pneumothorax seen. Osseous structures about the chest are unremarkable. IMPRESSION: No active disease. Electronically Signed   By: Franki Cabot M.D.   On: 01/12/2018 16:56   Dg Hip Unilat W Or Wo Pelvis 2-3 Views Right  Result Date: 01/12/2018 CLINICAL DATA:  Fall last night, right hip pain. EXAM: DG HIP  (WITH OR WITHOUT PELVIS) 2-3V RIGHT COMPARISON:  None. FINDINGS: Displaced fracture within the subcapital portion of the right femoral neck, with nearly 90 degrees angulation deformity at the fracture site. Right femoral head remains grossly well positioned relative to the acetabulum. No additional fracture identified within the osseous pelvis or about the left hip. IMPRESSION: Displaced fracture of the right femoral neck, subcapital region, with nearly 90 degrees angulation deformity at the fracture site. Electronically Signed   By: Franki Cabot M.D.   On: 01/12/2018 16:58    EKG: Ordered and pending.   Assessment/Plan   1. Right hip fracture  - Presents with severe right hip pain after a fall at home the night of 01/11/18  - Radiographs reveal displaced fracture of the right femoral neck  - He is neurovascularly intact distal to injury  - Orthopedic surgery is consulting and much appreciated  - Based on the available data, Mr. Sandler presents an estimated 0.3% risk probability for perioperative MI or cardiac arrest per Melburn Hake al - Keep NPO after MN, continue gentle IVF hydration, continue prn analgesia   2. Depression with anxiety  - Stable  - Continue Lexapro, Cymbalta, and prn Xanax    3. Open bimalleolar ankle fracture, right - Status-post ORIF  - Delayed wound healing, does not appear acutely infected on admission  - Continue supportive care and ortho follow-up    DVT prophylaxis: SCD's  Code Status: Full  Family Communication: Wife updated at bedside Disposition Plan: Admit to med-surg Consults called: Orthopedic surgery Admission status: Inpatient    Vianne Bulls, MD [facility] Pager [phone]  If 7PM-7AM, please contact night-coverage [email or web address] Password [email or web address]  01/12/2018, 8:30 PM

## 2018-01-12 NOTE — ED Notes (Signed)
emtala reviewed by this RN 

## 2018-01-12 NOTE — ED Triage Notes (Signed)
Pt presents via [facility] from home today for fall yesterday at 1030pm. Pt is Alert and oriented x4. Pt states pain is in r hip

## 2018-01-12 NOTE — ED Notes (Signed)
Report called at this time to [place] at 5 N at [facility].

## 2018-01-12 NOTE — Consult Note (Signed)
Reason for Consult: Displaced right hip femoral neck fracture Referring Physician: Nance Pear, MD, [facility] ED  Austin Dillon is an 67 y.o. male.  HPI: The patient is a 67 year old gentleman who is been seen by my partners recently.  He has been dealing with a slowly healing right ankle fracture for some time now.  He mainly gets around with crutches and last evening sustained a significant mechanical fall landing hard on his right hip.  He presented to the [facility] emergency room today with significant right hip pain and inability to ambulate.  He was evaluated there and found to have a displaced right hip femoral neck fracture.  The patient is family requested transfer to Austin Dillon in [place] for further evaluation and treatment since he is established as an orthopedic patient here.  The patient does report significant right hip pain and inability to ambulate.  Past Medical History:  Diagnosis Date  . Alzheimer disease   . Headache     Past Surgical History:  Procedure Laterality Date  . APPENDECTOMY    . EXTERNAL FIXATION LEG Right 03/26/2017   Procedure: EXTERNAL FIXATION RIGHT LEG;  Surgeon: Mcarthur Rossetti, MD;  Location: [place];  Service: Orthopedics;  Laterality: Right;  . ORIF ANKLE FRACTURE Right 03/29/2017   Procedure: OPEN REDUCTION INTERNAL FIXATION (ORIF) RIGHT  ANKLE FRACTURE, Removal of External Fixator;  Surgeon: Newt Minion, MD;  Location: [place];  Service: Orthopedics;  Laterality: Right;    History reviewed. No pertinent family history.  Social History:  reports that he has been smoking cigarettes.  He has a 25.00 pack-year smoking history. he has never used smokeless tobacco. He reports that he drinks alcohol. He reports that he does not use drugs.  Allergies:  Allergies  Allergen Reactions  . Nortriptyline Other (See Comments)    Severe mood swings    Medications: I have reviewed the patient's current  medications.  Results for orders placed or performed during the hospital encounter of 01/12/18 (from the past 48 hour(s))  CBC with Differential     Status: Abnormal   Collection Time: 01/12/18  4:14 PM  Result Value Ref Range   WBC 13.2 (H) 3.8 - 10.6 K/uL   RBC 4.46 4.40 - 5.90 MIL/uL   Hemoglobin 15.1 13.0 - 18.0 g/dL   HCT 43.4 40.0 - 52.0 %   MCV 97.4 80.0 - 100.0 fL   MCH 33.8 26.0 - 34.0 pg   MCHC 34.7 32.0 - 36.0 g/dL   RDW 12.7 11.5 - 14.5 %   Platelets 220 150 - 440 K/uL   Neutrophils Relative % 90 %   Neutro Abs 11.8 (H) 1.4 - 6.5 K/uL   Lymphocytes Relative 6 %   Lymphs Abs 0.8 (L) 1.0 - 3.6 K/uL   Monocytes Relative 4 %   Monocytes Absolute 0.6 0.2 - 1.0 K/uL   Eosinophils Relative 0 %   Eosinophils Absolute 0.0 0 - 0.7 K/uL   Basophils Relative 0 %   Basophils Absolute 0.0 0 - 0.1 K/uL    Comment: Performed at [facility], [address] [place], [place] [address]  Basic metabolic panel     Status: Abnormal   Collection Time: 01/12/18  4:14 PM  Result Value Ref Range   Sodium 134 (L) 135 - 145 mmol/L   Potassium 4.1 3.5 - 5.1 mmol/L    Comment: HEMOLYSIS AT THIS LEVEL MAY AFFECT RESULT   Chloride 99 (L) 101 -  111 mmol/L   CO2 26 22 - 32 mmol/L   Glucose, Bld 126 (H) 65 - 99 mg/dL   BUN 7 6 - 20 mg/dL   Creatinine, Ser 0.89 0.61 - 1.24 mg/dL   Calcium 9.4 8.9 - 10.3 mg/dL   GFR calc non Af Amer >60 >60 mL/min   GFR calc Af Amer >60 >60 mL/min    Comment: (NOTE) The eGFR has been calculated using the CKD EPI equation. This calculation has not been validated in all clinical situations. eGFR's persistently <60 mL/min signify possible Chronic Kidney Disease.    Anion gap 9 5 - 15    Comment: Performed at [facility], [address] [place], [place] [address]    Dg Chest 1 View  Result Date: 01/12/2018 CLINICAL DATA:  Fall, right hip pain. EXAM: CHEST 1 VIEW COMPARISON:  None. FINDINGS: Heart size and mediastinal contours are within  normal limits. Lungs are clear. No pleural effusion or pneumothorax seen. Osseous structures about the chest are unremarkable. IMPRESSION: No active disease. Electronically Signed   By: Franki Cabot M.D.   On: 01/12/2018 16:56   Dg Hip Unilat W Or Wo Pelvis 2-3 Views Right  Result Date: 01/12/2018 CLINICAL DATA:  Fall last night, right hip pain. EXAM: DG HIP (WITH OR WITHOUT PELVIS) 2-3V RIGHT COMPARISON:  None. FINDINGS: Displaced fracture within the subcapital portion of the right femoral neck, with nearly 90 degrees angulation deformity at the fracture site. Right femoral head remains grossly well positioned relative to the acetabulum. No additional fracture identified within the osseous pelvis or about the left hip. IMPRESSION: Displaced fracture of the right femoral neck, subcapital region, with nearly 90 degrees angulation deformity at the fracture site. Electronically Signed   By: Franki Cabot M.D.   On: 01/12/2018 16:58    ROS Blood pressure (!) 152/76, pulse 66, temperature 99.2 F (37.3 C), temperature source Oral, SpO2 98 %. Physical Exam  Constitutional: He is oriented to person, place, and time. He appears well-developed and well-nourished.  HENT:  Head: Normocephalic and atraumatic.  Eyes: EOM are normal. Pupils are equal, round, and reactive to light.  Neck: Normal range of motion. Neck supple.  Cardiovascular: Normal rate.  Respiratory: Effort normal. He has wheezes.  GI: Bowel sounds are normal.  Musculoskeletal:       Right hip: He exhibits decreased range of motion, decreased strength, tenderness and bony tenderness.  Neurological: He is alert and oriented to person, place, and time.  Skin: Skin is warm and dry.  Psychiatric: He has a normal mood and affect.   His right lower extremity is shortened and externally rotated.  Assessment/Plan: Displaced right hip femoral neck fracture  I spoke with the patient and his wife and detail about his right hip fracture and the  treatment options.  He is only 67 years old and this is a significant displaced fracture.  The only surgical treatment option given the fracture displacement is a right total hip arthroplasty.  I explained the details of the surgery to him we had a thorough discussion of the risks and benefits of surgery.  We will plan to proceed to the operating room sometime tomorrow for this surgery.  All questions and concerns were answered and addressed.  Mcarthur Rossetti 01/12/2018, 9:26 PM

## 2018-01-12 NOTE — ED Notes (Signed)
Pt presents today for fall last night that has caused r hip pain. Pt states his ankle injury is now about 29 mth old. Pt still ambulates on crutches when the rubber end came off and he fell. Pt crawled back to couch which is where the EMS got him from.

## 2018-01-13 ENCOUNTER — Encounter (HOSPITAL_COMMUNITY): Payer: Self-pay | Admitting: Certified Registered Nurse Anesthetist

## 2018-01-13 ENCOUNTER — Inpatient Hospital Stay (HOSPITAL_COMMUNITY): Payer: Medicare Other

## 2018-01-13 ENCOUNTER — Encounter (HOSPITAL_COMMUNITY)
Admission: AD | Disposition: A | Discharge status: Home / Self Care | Payer: Self-pay | Source: Other Acute Inpatient Hospital | Attending: Family Medicine

## 2018-01-13 ENCOUNTER — Inpatient Hospital Stay (HOSPITAL_COMMUNITY): Payer: Medicare Other | Admitting: Certified Registered Nurse Anesthetist

## 2018-01-13 DIAGNOSIS — R52 Pain, unspecified: Secondary | ICD-10-CM

## 2018-01-13 HISTORY — PX: TOTAL HIP ARTHROPLASTY: SHX124

## 2018-01-13 LAB — CBC
HEMATOCRIT: 41.9 % (ref 39.0–52.0)
Hemoglobin: 14 g/dL (ref 13.0–17.0)
MCH: 33 pg (ref 26.0–34.0)
MCHC: 33.4 g/dL (ref 30.0–36.0)
MCV: 98.8 fL (ref 78.0–100.0)
Platelets: 188 10*3/uL (ref 150–400)
RBC: 4.24 MIL/uL (ref 4.22–5.81)
RDW: 12.4 % (ref 11.5–15.5)
WBC: 10.5 10*3/uL (ref 4.0–10.5)

## 2018-01-13 LAB — BASIC METABOLIC PANEL
Anion gap: 10 (ref 5–15)
BUN: 7 mg/dL (ref 6–20)
CALCIUM: 8.9 mg/dL (ref 8.9–10.3)
CO2: 27 mmol/L (ref 22–32)
CREATININE: 0.97 mg/dL (ref 0.61–1.24)
Chloride: 100 mmol/L — ABNORMAL LOW (ref 101–111)
GFR calc Af Amer: >60 mL/min (ref >60)
GFR calc non Af Amer: >60 mL/min (ref >60)
GLUCOSE: 105 mg/dL — AB (ref 65–99)
Potassium: 4.2 mmol/L (ref 3.5–5.1)
Sodium: 137 mmol/L (ref 135–145)

## 2018-01-13 LAB — SURGICAL PCR SCREEN
MRSA, PCR: NEGATIVE
STAPHYLOCOCCUS AUREUS: NEGATIVE

## 2018-01-13 SURGERY — ARTHROPLASTY, HIP, TOTAL, ANTERIOR APPROACH
Anesthesia: General | Site: Hip | Laterality: Right

## 2018-01-13 MED ORDER — LACTATED RINGERS IV SOLN
INTRAVENOUS | Status: DC | PRN
  Administered 2018-01-13 (×2): via INTRAVENOUS

## 2018-01-13 MED ORDER — MENTHOL 3 MG MT LOZG: 1.0000 | LOZENGE | OROMUCOSAL | Status: DC | PRN

## 2018-01-13 MED ORDER — FENTANYL CITRATE (PF) 100 MCG/2ML IJ SOLN
INTRAMUSCULAR | Status: AC
  Administered 2018-01-13: 50 ug via INTRAVENOUS
  Filled 2018-01-13: qty 2

## 2018-01-13 MED ORDER — OXYCODONE HCL 5 MG/5ML PO SOLN: 5.0000 mg | Freq: Once | ORAL | Status: DC | PRN

## 2018-01-13 MED ORDER — OXYCODONE HCL 5 MG PO TABS: 5.0000 mg | ORAL_TABLET | Freq: Once | ORAL | Status: DC | PRN

## 2018-01-13 MED ORDER — SUGAMMADEX SODIUM 500 MG/5ML IV SOLN
INTRAVENOUS | Status: AC
  Filled 2018-01-13: qty 5

## 2018-01-13 MED ORDER — ONDANSETRON HCL 4 MG/2ML IJ SOLN: 4.0000 mg | Freq: Once | INTRAMUSCULAR | Status: DC | PRN

## 2018-01-13 MED ORDER — EPHEDRINE SULFATE 50 MG/ML IJ SOLN
INTRAMUSCULAR | Status: DC | PRN
  Administered 2018-01-13: 10 mg via INTRAVENOUS

## 2018-01-13 MED ORDER — METOCLOPRAMIDE HCL 5 MG/ML IJ SOLN: 5.0000 mg | Freq: Three times a day (TID) | INTRAMUSCULAR | Status: DC | PRN

## 2018-01-13 MED ORDER — METHOCARBAMOL 500 MG PO TABS
ORAL_TABLET | ORAL | Status: AC
  Administered 2018-01-13: 500 mg via ORAL
  Filled 2018-01-13: qty 1

## 2018-01-13 MED ORDER — ACETAMINOPHEN 325 MG PO TABS
650.0000 mg | ORAL_TABLET | Freq: Four times a day (QID) | ORAL | Status: DC | PRN
  Administered 2018-01-14: 650 mg via ORAL
  Filled 2018-01-13: qty 2

## 2018-01-13 MED ORDER — FENTANYL CITRATE (PF) 250 MCG/5ML IJ SOLN
INTRAMUSCULAR | Status: AC
  Filled 2018-01-13: qty 5

## 2018-01-13 MED ORDER — ONDANSETRON HCL 4 MG/2ML IJ SOLN: 4.0000 mg | Freq: Four times a day (QID) | INTRAMUSCULAR | Status: DC | PRN

## 2018-01-13 MED ORDER — LIDOCAINE HCL (CARDIAC) 20 MG/ML IV SOLN
INTRAVENOUS | Status: DC | PRN
  Administered 2018-01-13: 40 mg via INTRAVENOUS

## 2018-01-13 MED ORDER — ONDANSETRON HCL 4 MG/2ML IJ SOLN
INTRAMUSCULAR | Status: DC | PRN
  Administered 2018-01-13: 4 mg via INTRAVENOUS

## 2018-01-13 MED ORDER — ALBUMIN HUMAN 5 % IV SOLN
INTRAVENOUS | Status: DC | PRN
  Administered 2018-01-13: 10:00:00 via INTRAVENOUS

## 2018-01-13 MED ORDER — SUGAMMADEX SODIUM 200 MG/2ML IV SOLN
INTRAVENOUS | Status: DC | PRN
  Administered 2018-01-13: 200 mg via INTRAVENOUS

## 2018-01-13 MED ORDER — FENTANYL CITRATE (PF) 100 MCG/2ML IJ SOLN
INTRAMUSCULAR | Status: DC | PRN
  Administered 2018-01-13 (×3): 100 ug via INTRAVENOUS
  Administered 2018-01-13: 50 ug via INTRAVENOUS

## 2018-01-13 MED ORDER — ASPIRIN EC 325 MG PO TBEC
325.0000 mg | DELAYED_RELEASE_TABLET | Freq: Every day | ORAL | Status: DC
  Administered 2018-01-14: 325 mg via ORAL
  Filled 2018-01-13: qty 1

## 2018-01-13 MED ORDER — HYDROCODONE-ACETAMINOPHEN 5-325 MG PO TABS
1.0000 | ORAL_TABLET | Freq: Four times a day (QID) | ORAL | Status: DC | PRN
  Administered 2018-01-13 – 2018-01-17 (×9): 2 via ORAL
  Filled 2018-01-13 (×11): qty 2

## 2018-01-13 MED ORDER — ACETAMINOPHEN 650 MG RE SUPP: 650.0000 mg | Freq: Four times a day (QID) | RECTAL | Status: DC | PRN

## 2018-01-13 MED ORDER — MEMANTINE HCL 10 MG PO TABS
10.0000 mg | ORAL_TABLET | Freq: Two times a day (BID) | ORAL | Status: DC
  Administered 2018-01-13 – 2018-01-17 (×8): 10 mg via ORAL
  Filled 2018-01-13 (×8): qty 1

## 2018-01-13 MED ORDER — PROPOFOL 10 MG/ML IV BOLUS
INTRAVENOUS | Status: DC | PRN
  Administered 2018-01-13: 150 mg via INTRAVENOUS

## 2018-01-13 MED ORDER — 0.9 % SODIUM CHLORIDE (POUR BTL) OPTIME
TOPICAL | Status: DC | PRN
  Administered 2018-01-13: 1000 mL

## 2018-01-13 MED ORDER — MIDAZOLAM HCL 2 MG/2ML IJ SOLN
INTRAMUSCULAR | Status: AC
  Filled 2018-01-13: qty 2

## 2018-01-13 MED ORDER — ROCURONIUM BROMIDE 100 MG/10ML IV SOLN
INTRAVENOUS | Status: DC | PRN
  Administered 2018-01-13: 50 mg via INTRAVENOUS

## 2018-01-13 MED ORDER — LACTATED RINGERS IV SOLN
INTRAVENOUS | Status: DC
  Administered 2018-01-13: 09:00:00 via INTRAVENOUS

## 2018-01-13 MED ORDER — FENTANYL CITRATE (PF) 100 MCG/2ML IJ SOLN
25.0000 ug | INTRAMUSCULAR | Status: DC | PRN
  Administered 2018-01-13 (×3): 50 ug via INTRAVENOUS

## 2018-01-13 MED ORDER — SODIUM CHLORIDE 0.9 % IR SOLN
Status: DC | PRN
  Administered 2018-01-13: 3000 mL

## 2018-01-13 MED ORDER — PROPOFOL 10 MG/ML IV BOLUS
INTRAVENOUS | Status: AC
  Filled 2018-01-13: qty 20

## 2018-01-13 MED ORDER — PHENOL 1.4 % MT LIQD: 1.0000 | OROMUCOSAL | Status: DC | PRN

## 2018-01-13 MED ORDER — ONDANSETRON HCL 4 MG PO TABS: 4.0000 mg | ORAL_TABLET | Freq: Four times a day (QID) | ORAL | Status: DC | PRN

## 2018-01-13 MED ORDER — METOCLOPRAMIDE HCL 5 MG PO TABS: 5.0000 mg | ORAL_TABLET | Freq: Three times a day (TID) | ORAL | Status: DC | PRN

## 2018-01-13 MED ORDER — SODIUM CHLORIDE 0.9 % IV SOLN
INTRAVENOUS | Status: DC
  Administered 2018-01-13 – 2018-01-14 (×2): via INTRAVENOUS

## 2018-01-13 MED ORDER — PHENYLEPHRINE HCL 10 MG/ML IJ SOLN
INTRAMUSCULAR | Status: DC | PRN
  Administered 2018-01-13 (×4): 80 ug via INTRAVENOUS

## 2018-01-13 MED ORDER — CEFAZOLIN SODIUM-DEXTROSE 2-4 GM/100ML-% IV SOLN
2.0000 g | Freq: Four times a day (QID) | INTRAVENOUS | Status: AC
  Administered 2018-01-13 (×2): 2 g via INTRAVENOUS
  Filled 2018-01-13 (×2): qty 100

## 2018-01-13 MED ORDER — FENTANYL CITRATE (PF) 100 MCG/2ML IJ SOLN
INTRAMUSCULAR | Status: AC
  Filled 2018-01-13: qty 2

## 2018-01-13 SURGICAL SUPPLY — 43 items
BLADE SAW SGTL 18X1.27X75 (BLADE) ×2 IMPLANT
BLADE SAW SGTL 18X1.27X75MM (BLADE) ×1
CAPT HIP TOTAL 2 ×2 IMPLANT
COVER SURGICAL LIGHT HANDLE (MISCELLANEOUS) ×3 IMPLANT
DRAPE C-ARM 42X72 X-RAY (DRAPES) ×3 IMPLANT
DRAPE STERI IOBAN 125X83 (DRAPES) ×3 IMPLANT
DRAPE U-SHAPE 47X51 STRL (DRAPES) ×9 IMPLANT
DRSG AQUACEL AG ADV 3.5X10 (GAUZE/BANDAGES/DRESSINGS) ×2 IMPLANT
DURAPREP 26ML APPLICATOR (WOUND CARE) ×3 IMPLANT
ELECT BLADE 4.0 EZ CLEAN MEGAD (MISCELLANEOUS) ×3
ELECT REM PT RETURN 9FT ADLT (ELECTROSURGICAL) ×3
ELECTRODE BLDE 4.0 EZ CLN MEGD (MISCELLANEOUS) ×1 IMPLANT
ELECTRODE REM PT RTRN 9FT ADLT (ELECTROSURGICAL) ×1 IMPLANT
FACESHIELD WRAPAROUND (MASK) ×3 IMPLANT
FACESHIELD WRAPAROUND OR TEAM (MASK) ×2 IMPLANT
GAUZE XEROFORM 5X9 LF (GAUZE/BANDAGES/DRESSINGS) ×2 IMPLANT
GLOVE BIOGEL PI IND STRL 8 (GLOVE) ×2 IMPLANT
GLOVE BIOGEL PI INDICATOR 8 (GLOVE) ×2
GLOVE ORTHO TXT STRL SZ7.5 (GLOVE) ×6 IMPLANT
GOWN STRL REUS W/ TWL LRG LVL3 (GOWN DISPOSABLE) ×2 IMPLANT
GOWN STRL REUS W/ TWL XL LVL3 (GOWN DISPOSABLE) ×2 IMPLANT
GOWN STRL REUS W/TWL LRG LVL3 (GOWN DISPOSABLE) ×3
GOWN STRL REUS W/TWL XL LVL3 (GOWN DISPOSABLE) ×6
HANDPIECE INTERPULSE COAX TIP (DISPOSABLE) ×3
KIT BASIN OR (CUSTOM PROCEDURE TRAY) ×3 IMPLANT
KIT ROOM TURNOVER OR (KITS) ×3 IMPLANT
MANIFOLD NEPTUNE II (INSTRUMENTS) ×3 IMPLANT
NS IRRIG 1000ML POUR BTL (IV SOLUTION) ×3 IMPLANT
PACK TOTAL JOINT (CUSTOM PROCEDURE TRAY) ×3 IMPLANT
PAD ARMBOARD 7.5X6 YLW CONV (MISCELLANEOUS) ×5 IMPLANT
SET HNDPC FAN SPRY TIP SCT (DISPOSABLE) ×1 IMPLANT
SPONGE LAP 18X18 X RAY DECT (DISPOSABLE) ×2 IMPLANT
STAPLER VISISTAT 35W (STAPLE) ×2 IMPLANT
SUT MNCRL AB 4-0 PS2 18 (SUTURE) ×2 IMPLANT
SUT VIC AB 0 CT1 27 (SUTURE) ×6
SUT VIC AB 0 CT1 27XBRD ANBCTR (SUTURE) ×1 IMPLANT
SUT VIC AB 1 CT1 27 (SUTURE) ×6
SUT VIC AB 1 CT1 27XBRD ANBCTR (SUTURE) ×1 IMPLANT
SUT VIC AB 2-0 CT1 27 (SUTURE) ×3
SUT VIC AB 2-0 CT1 TAPERPNT 27 (SUTURE) ×1 IMPLANT
TOWEL OR 17X24 6PK STRL BLUE (TOWEL DISPOSABLE) ×3 IMPLANT
TOWEL OR 17X26 10 PK STRL BLUE (TOWEL DISPOSABLE) ×3 IMPLANT
WATER STERILE IRR 1000ML POUR (IV SOLUTION) ×4 IMPLANT

## 2018-01-13 NOTE — Progress Notes (Signed)
Initial Nutrition Assessment  DOCUMENTATION CODES:   Not applicable   INTERVENTION:    Advance diet as medically appropriate   RD to add supplements as needed  NUTRITION DIAGNOSIS:   Increased nutrient needs related to hip fracture as evidenced by estimated needs  GOAL:   Patient will meet greater than or equal to 90% of their needs  MONITOR:   Diet advancement, PO intake, Supplement acceptance, Labs, Skin, Weight trends  REASON FOR ASSESSMENT:   Consult Hip fracture protocol  ASSESSMENT:   67 y.o. Male with PMH significant for Alzheimer's disease, chronic headaches, depression with anxiety, and open bimalleolar right ankle fracture last spring status post ORIF, now presenting to the emergency department for evaluation of severe right hip pain after a fall at home.  Pt currently in OR for R total hip replacement. Prior to NPO status was on a Regular diet. No nutrition problems identified PTA. Labs and medications reviewed.  NUTRITION - FOCUSED PHYSICAL EXAM:  Unable to complete at this time.  Diet Order:  Diet NPO time specified Except for: Sips with Meds  EDUCATION NEEDS:   Not appropriate for education at this time  Skin:  Skin Assessment: Reviewed RN Assessment  Last BM:  N/A  Height:   Ht Readings from Last 1 Encounters:  01/13/18 5\' 8"  (1.727 m)   Weight:   Wt Readings from Last 1 Encounters:  01/13/18 180 lb (81.6 kg)   Ideal Body Weight:  70 kg  BMI:  Body mass index is 27.37 kg/m.  Estimated Nutritional Needs:   Kcal:  2000-2200  Protein:  100-115 gm  Fluid:  2.0-2.2 L  Arthur Holms, RD, LDN Pager #: [phone] After-Hours Pager #: [phone]

## 2018-01-13 NOTE — Plan of Care (Signed)
  Nutrition: Adequate nutrition will be maintained 01/13/2018 1510 - Progressing by Williams Che, RN   Elimination: Will not experience complications related to bowel motility 01/13/2018 1510 - Progressing by Williams Che, RN   Pain Managment: General experience of comfort will improve 01/13/2018 1510 - Progressing by Williams Che, RN   Safety: Ability to remain free from injury will improve 01/13/2018 1510 - Progressing by Williams Che, RN

## 2018-01-13 NOTE — Transfer of Care (Signed)
Immediate Anesthesia Transfer of Care Note  Patient: Austin Dillon  Procedure(s) Performed: RIGHT ANTERIOR TOTAL HIP REPLACEMENT (Right Hip)  Patient Location: PACU  Anesthesia Type:General  Level of Consciousness: awake, alert  and oriented  Airway & Oxygen Therapy: Patient Spontanous Breathing and Patient connected to nasal cannula oxygen  Post-op Assessment: Report given to RN and Post -op Vital signs reviewed and stable  Post vital signs: Reviewed and stable  Last Vitals:  Vitals:   01/12/18 1920 01/13/18 1150  BP: (!) 152/76 (!) 143/71  Pulse: 66 96  Resp:  19  Temp: 37.3 C (!) 36.2 C  SpO2: 98% 100%    Last Pain:  Vitals:   01/13/18 0656  TempSrc:   PainSc: Asleep      Patients Stated Pain Goal: 3 (43/14/27 6701)  Complications: No apparent anesthesia complications

## 2018-01-13 NOTE — Anesthesia Procedure Notes (Signed)
Procedure Name: Intubation Date/Time: 01/13/2018 9:57 AM Performed by: Treavor Blomquist T, CRNA Pre-anesthesia Checklist: Patient identified, Emergency Drugs available, Suction available and Patient being monitored Patient Re-evaluated:Patient Re-evaluated prior to induction Oxygen Delivery Method: Circle system utilized Preoxygenation: Pre-oxygenation with 100% oxygen Induction Type: IV induction Ventilation: Mask ventilation without difficulty Laryngoscope Size: Miller and 3 Grade View: Grade I Tube type: Oral Tube size: 7.5 mm Number of attempts: 1 Airway Equipment and Method: Patient positioned with wedge pillow and Stylet Placement Confirmation: ETT inserted through vocal cords under direct vision,  positive ETCO2 and breath sounds checked- equal and bilateral Secured at: 22 cm Tube secured with: Tape Dental Injury: Teeth and Oropharynx as per pre-operative assessment

## 2018-01-13 NOTE — Brief Op Note (Signed)
01/13/2018  11:46 AM  PATIENT:  Austin Dillon  67 y.o. male  PRE-OPERATIVE DIAGNOSIS:  right hip fracture  POST-OPERATIVE DIAGNOSIS:  right hip fracture  PROCEDURE:  Procedure(s): RIGHT ANTERIOR TOTAL HIP REPLACEMENT (Right)  SURGEON:  Surgeon(s) and Role:    Mcarthur Rossetti, MD - Primary  ANESTHESIA:   general  EBL:  350 mL   COUNTS:  YES  DICTATION: .Other Dictation: Dictation Number [id number]  PLAN OF CARE: Admit to inpatient   PATIENT DISPOSITION:  PACU - hemodynamically stable.   Delay start of Pharmacological VTE agent (>24hrs) due to surgical blood loss or risk of bleeding: no

## 2018-01-13 NOTE — Anesthesia Preprocedure Evaluation (Signed)
Anesthesia Evaluation  Patient identified by MRN, date of birth, ID band Patient awake    Reviewed: Allergy & Precautions, NPO status , Patient's Chart, lab work & pertinent test results  Airway Mallampati: II  TM Distance: >3 FB Neck ROM: Full    Dental  (+) Teeth Intact   Pulmonary Current Smoker,    breath sounds clear to auscultation       Cardiovascular  Rhythm:Regular Rate:Normal     Neuro/Psych    GI/Hepatic   Endo/Other    Renal/GU      Musculoskeletal   Abdominal   Peds  Hematology   Anesthesia Other Findings   Reproductive/Obstetrics                             Anesthesia Physical Anesthesia Plan  ASA: III  Anesthesia Plan: General   Post-op Pain Management:    Induction: Intravenous  PONV Risk Score and Plan: Ondansetron and Dexamethasone  Airway Management Planned: Oral ETT  Additional Equipment:   Intra-op Plan:   Post-operative Plan: Extubation in OR  Informed Consent: I have reviewed the patients History and Physical, chart, labs and discussed the procedure including the risks, benefits and alternatives for the proposed anesthesia with the patient or authorized representative who has indicated his/her understanding and acceptance.   Dental advisory given  Plan Discussed with: CRNA and Anesthesiologist  Anesthesia Plan Comments:         Anesthesia Quick Evaluation  

## 2018-01-13 NOTE — Progress Notes (Signed)
Patient ID: Austin Dillon, male   DOB: 11-14-1951, 67 y.o.   MRN: [id number] The patient understands the plan to proceed to surgery today for a right total hip replacement to address hi displaced right hip femoral neck fracture.  The risks and benefits of surgery have been discussed in detail.

## 2018-01-13 NOTE — Anesthesia Postprocedure Evaluation (Signed)
Anesthesia Post Note  Patient: [place]  Procedure(s) Performed: RIGHT ANTERIOR TOTAL HIP REPLACEMENT (Right Hip)     Patient location during evaluation: PACU Anesthesia Type: General Level of consciousness: awake and alert Pain management: pain level controlled Vital Signs Assessment: post-procedure vital signs reviewed and stable Respiratory status: spontaneous breathing, nonlabored ventilation, respiratory function stable and patient connected to nasal cannula oxygen Cardiovascular status: blood pressure returned to baseline and stable Postop Assessment: no apparent nausea or vomiting Anesthetic complications: no    Last Vitals:  Vitals:   01/13/18 1250 01/13/18 1306  BP: 137/70 140/65  Pulse: 80 86  Resp: 18 18  Temp: 36.7 C 36.9 C  SpO2: 93% 95%    Last Pain:  Vitals:   01/13/18 1547  TempSrc:   PainSc: 0-No pain                 Keeli Roberg COKER

## 2018-01-13 NOTE — Progress Notes (Signed)
[facility] PROGRESS NOTE  Austin Dillon MEQ:[id number] DOB: 11-06-1951 DOA: 01/12/2018 PCP: Baxter Hire, MD  Assessment/Plan:  1. Right hip fracture , postop day 0 - Presents with severe right hip pain after a fall at home the night of 01/11/18  - Radiographs reveal displaced fracture of the right femoral neck  - Orthopedic surgery is consulting and much appreciated  -As needed oxycodone and Robaxin for pain control  2. Depression with anxiety  - Stable  - Continue Lexapro, Cymbalta, and prn Xanax    3. Open bimalleolar ankle fracture, right - Status-post ORIF  - Delayed wound healing, does not appear acutely infected on admission  - Continue supportive care and ortho follow-up    DVT prophylaxis: SCD's  Code Status: Full  Family Communication: Wife updated at bedside Disposition Plan: Admit to med-surg Consults called: Orthopedic surgery Admission status: Inpatient      Consultants:  Orthopedics  Procedures:  Right anterior hip replacement February 9  Antibiotics:  ---  HPI/Subjective: Patient states he feels well.  Has right hip pain postoperatively.  No shortness of breath or chest pain.  Objective: Vitals:   01/12/18 1920 01/13/18 1150  BP: (!) 152/76 (!) 143/71  Pulse: 66 96  Resp:  19  Temp: 99.2 F (37.3 C) (!) 97.2 F (36.2 C)  SpO2: 98% 100%    Intake/Output Summary (Last 24 hours) at 01/13/2018 1230 Last data filed at 01/13/2018 1158 Gross per 24 hour  Intake 1450 ml  Output 950 ml  Net 500 ml   Filed Weights   01/13/18 1040  Weight: 81.6 kg (180 lb)    Exam:   General:  NCAT, NAD, sleepy  Cardiovascular: RRR, no MRG  Respiratory: CTAB, nl wob  Abdomen: NS+, ND  Musculoskeletal: able to move feet, dressing on R hip CDI   Data Reviewed: Basic Metabolic Panel: Recent Labs  Lab 01/12/18 1614 01/13/18 0601  NA 134* 137  K 4.1 4.2  CL 99* 100*  CO2 26 27  GLUCOSE 126* 105*  BUN 7 7  CREATININE 0.89 0.97   CALCIUM 9.4 8.9   Liver Function Tests: No results for input(s): AST, ALT, ALKPHOS, BILITOT, PROT, ALBUMIN in the last 168 hours. No results for input(s): LIPASE, AMYLASE in the last 168 hours. No results for input(s): AMMONIA in the last 168 hours. CBC: Recent Labs  Lab 01/12/18 1614 01/13/18 0601  WBC 13.2* 10.5  NEUTROABS 11.8*  --   HGB 15.1 14.0  HCT 43.4 41.9  MCV 97.4 98.8  PLT 220 188   Cardiac Enzymes: No results for input(s): CKTOTAL, CKMB, CKMBINDEX, TROPONINI in the last 168 hours. BNP (last 3 results) No results for input(s): BNP in the last 8760 hours.  ProBNP (last 3 results) No results for input(s): PROBNP in the last 8760 hours.  CBG: No results for input(s): GLUCAP in the last 168 hours.  Recent Results (from the past 240 hour(s))  Surgical pcr screen     Status: None   Collection Time: 01/13/18 12:11 AM  Result Value Ref Range Status   MRSA, PCR NEGATIVE NEGATIVE Final   Staphylococcus aureus NEGATIVE NEGATIVE Final    Comment: (NOTE) The Xpert SA Assay (FDA approved for NASAL specimens in patients 14 years of age and older), is one component of a comprehensive surveillance program. It is not intended to diagnose infection nor to guide or monitor treatment. Performed at [facility], [place] [address] [place], [place] [address]  Studies: Dg Chest 1 View  Result Date: 01/12/2018 CLINICAL DATA:  Fall, right hip pain. EXAM: CHEST 1 VIEW COMPARISON:  None. FINDINGS: Heart size and mediastinal contours are within normal limits. Lungs are clear. No pleural effusion or pneumothorax seen. Osseous structures about the chest are unremarkable. IMPRESSION: No active disease. Electronically Signed   By: Franki Cabot M.D.   On: 01/12/2018 16:56   Pelvis Portable  Result Date: 01/13/2018 CLINICAL DATA:  Postop right total hip replacement EXAM: PORTABLE PELVIS 1-2 VIEWS COMPARISON:  01/12/2018 FINDINGS: Changes of right hip replacement. Normal AP  alignment. No hardware or bony complicating feature. IMPRESSION: Right hip replacement.  No complicating feature. Electronically Signed   By: Rolm Baptise M.D.   On: 01/13/2018 12:23   Dg C-arm 1-60 Min  Result Date: 01/13/2018 CLINICAL DATA:  Right hip replacement EXAM: DG C-ARM 61-120 MIN; OPERATIVE RIGHT HIP WITH PELVIS COMPARISON:  01/12/2018 FINDINGS: Changes of right hip replacement. Normal AP alignment. No hardware or bony complicating feature. IMPRESSION: Right hip replacement.  No complicating feature. Electronically Signed   By: Rolm Baptise M.D.   On: 01/13/2018 12:22   Dg Hip Operative Unilat With Pelvis Right  Result Date: 01/13/2018 CLINICAL DATA:  Right hip replacement EXAM: DG C-ARM 61-120 MIN; OPERATIVE RIGHT HIP WITH PELVIS COMPARISON:  01/12/2018 FINDINGS: Changes of right hip replacement. Normal AP alignment. No hardware or bony complicating feature. IMPRESSION: Right hip replacement.  No complicating feature. Electronically Signed   By: Rolm Baptise M.D.   On: 01/13/2018 12:22   Dg Hip Unilat W Or Wo Pelvis 2-3 Views Right  Result Date: 01/12/2018 CLINICAL DATA:  Fall last night, right hip pain. EXAM: DG HIP (WITH OR WITHOUT PELVIS) 2-3V RIGHT COMPARISON:  None. FINDINGS: Displaced fracture within the subcapital portion of the right femoral neck, with nearly 90 degrees angulation deformity at the fracture site. Right femoral head remains grossly well positioned relative to the acetabulum. No additional fracture identified within the osseous pelvis or about the left hip. IMPRESSION: Displaced fracture of the right femoral neck, subcapital region, with nearly 90 degrees angulation deformity at the fracture site. Electronically Signed   By: Franki Cabot M.D.   On: 01/12/2018 16:58    Scheduled Meds: . [MAR Hold] cholecalciferol  1,000 Units Oral QHS  . [MAR Hold] DULoxetine  20 mg Oral Daily  . [MAR Hold] escitalopram  10 mg Oral Daily  . fentaNYL      . [MAR Hold] lamoTRIgine   100 mg Oral Daily  . [MAR Hold] lamoTRIgine  150 mg Oral QHS  . [MAR Hold] metaxalone  800 mg Oral QID  . [MAR Hold] mupirocin ointment  1 application Topical BID  . povidone-iodine  2 application Topical Once   Continuous Infusions: . lactated ringers 10 mL/hr at 01/13/18 0848    Principal Problem:   Closed right hip fracture, initial encounter [facility]) Active Problems:   Open fracture ankle, bimalleolar, right, type III, with delayed healing, subsequent encounter   Depression with anxiety    Time spent: [place]. If 7PM-7AM, please contact night-coverage at [email or web address], password [facility] 01/13/2018, 12:30 PM  LOS: 1 day

## 2018-01-13 NOTE — Progress Notes (Signed)
PT Cancellation Note  Patient Details Name: Austin Dillon MRN: [id number] DOB: 08/07/1951   Cancelled Treatment:    Reason Eval/Treat Not Completed: Other (comment). Pt currently with bed rest orders in place. PT will continue to f/u with pt and await updated activity orders prior to initiating evaluation.   [place] 01/13/2018, 4:45 PM

## 2018-01-14 ENCOUNTER — Inpatient Hospital Stay (HOSPITAL_COMMUNITY): Payer: Medicare Other

## 2018-01-14 DIAGNOSIS — R5082 Postprocedural fever: Secondary | ICD-10-CM

## 2018-01-14 LAB — BASIC METABOLIC PANEL
Anion gap: 8 (ref 5–15)
BUN: 5 mg/dL — AB (ref 6–20)
CALCIUM: 8.6 mg/dL — AB (ref 8.9–10.3)
CHLORIDE: 102 mmol/L (ref 101–111)
CO2: 28 mmol/L (ref 22–32)
CREATININE: 0.97 mg/dL (ref 0.61–1.24)
Glucose, Bld: 135 mg/dL — ABNORMAL HIGH (ref 65–99)
Potassium: 5 mmol/L (ref 3.5–5.1)
SODIUM: 138 mmol/L (ref 135–145)

## 2018-01-14 LAB — URINALYSIS, ROUTINE W REFLEX MICROSCOPIC
BILIRUBIN URINE: NEGATIVE
Glucose, UA: NEGATIVE mg/dL
Hgb urine dipstick: NEGATIVE
KETONES UR: NEGATIVE mg/dL
Leukocytes, UA: NEGATIVE
Nitrite: NEGATIVE
PH: 5 (ref 5.0–8.0)
Protein, ur: NEGATIVE mg/dL
Specific Gravity, Urine: 1.025 (ref 1.005–1.030)

## 2018-01-14 LAB — CBC WITH DIFFERENTIAL/PLATELET
Basophils Absolute: 0 10*3/uL (ref 0.0–0.1)
Basophils Relative: 0 %
EOS ABS: 0.3 10*3/uL (ref 0.0–0.7)
EOS PCT: 3 %
HCT: 33.1 % — ABNORMAL LOW (ref 39.0–52.0)
Hemoglobin: 10.5 g/dL — ABNORMAL LOW (ref 13.0–17.0)
LYMPHS ABS: 1.8 10*3/uL (ref 0.7–4.0)
Lymphocytes Relative: 21 %
MCH: 32.4 pg (ref 26.0–34.0)
MCHC: 31.7 g/dL (ref 30.0–36.0)
MCV: 102.2 fL — ABNORMAL HIGH (ref 78.0–100.0)
MONO ABS: 0.8 10*3/uL (ref 0.1–1.0)
Monocytes Relative: 9 %
Neutro Abs: 5.7 10*3/uL (ref 1.7–7.7)
Neutrophils Relative %: 67 %
PLATELETS: 168 10*3/uL (ref 150–400)
RBC: 3.24 MIL/uL — AB (ref 4.22–5.81)
RDW: 12.5 % (ref 11.5–15.5)
WBC: 8.6 10*3/uL (ref 4.0–10.5)

## 2018-01-14 LAB — INFLUENZA PANEL BY PCR (TYPE A & B)
Influenza A By PCR: NEGATIVE
Influenza B By PCR: NEGATIVE

## 2018-01-14 LAB — LACTIC ACID, PLASMA
Lactic Acid, Venous: 1.5 mmol/L (ref 0.5–1.9)
Lactic Acid, Venous: 1.5 mmol/L (ref 0.5–1.9)

## 2018-01-14 MED ORDER — OXYCODONE-ACETAMINOPHEN 5-325 MG PO TABS: 1.0000 | ORAL_TABLET | Freq: Four times a day (QID) | ORAL | 0 refills | Status: DC | PRN

## 2018-01-14 MED ORDER — VANCOMYCIN HCL IN DEXTROSE 1-5 GM/200ML-% IV SOLN
1000.0000 mg | Freq: Two times a day (BID) | INTRAVENOUS | Status: DC
  Administered 2018-01-14 – 2018-01-16 (×5): 1000 mg via INTRAVENOUS
  Filled 2018-01-14 (×6): qty 200

## 2018-01-14 MED ORDER — ASPIRIN 325 MG PO TBEC: 325.0000 mg | DELAYED_RELEASE_TABLET | Freq: Two times a day (BID) | ORAL | 0 refills | Status: DC

## 2018-01-14 MED ORDER — ASPIRIN EC 325 MG PO TBEC
325.0000 mg | DELAYED_RELEASE_TABLET | Freq: Two times a day (BID) | ORAL | Status: DC
  Administered 2018-01-14 – 2018-01-17 (×6): 325 mg via ORAL
  Filled 2018-01-14 (×6): qty 1

## 2018-01-14 MED ORDER — PIPERACILLIN-TAZOBACTAM 3.375 G IVPB
3.3750 g | Freq: Three times a day (TID) | INTRAVENOUS | Status: DC
  Administered 2018-01-14 – 2018-01-16 (×8): 3.375 g via INTRAVENOUS
  Filled 2018-01-14 (×9): qty 50

## 2018-01-14 NOTE — Plan of Care (Signed)
  Nutrition: Adequate nutrition will be maintained 01/14/2018 1328 - Progressing by Williams Che, RN   Elimination: Will not experience complications related to bowel motility 01/14/2018 1328 - Progressing by Williams Che, RN   Pain Managment: General experience of comfort will improve 01/14/2018 1328 - Progressing by Williams Che, RN   Safety: Ability to remain free from injury will improve 01/14/2018 1328 - Progressing by Williams Che, RN

## 2018-01-14 NOTE — Evaluation (Signed)
Physical Therapy Evaluation Patient Details Name: Austin Dillon MRN: [id number] DOB: 1951-02-01 Today's Date: 01/14/2018   History of Present Illness  Pt is a 67 y/o M PMHx significant for Alzheimer's disease, chronic headaches, depression with anxiety, and open bimalleolar right ankle fracture last spring status post ORIF, Pt presented to the ED with severe right hip pain after a fall at home; found to have displaced right hip femoral neck fracture. Pt is now s/p R anterior THA on 01/13/18  Clinical Impression  Patient presents with decreased ambulation distance and increased assistance to transfer. At baseline he was using crutches 2nd to a fractured right foot. He would be safer on the rolling walker. His wife assisted him at home. He will likely need more assistance 2nd to his new surgery. If the wife feels comfortable assisting him he may benefit from going home with home health. He may also benefit most from rehab at a SNF to increase his safety at discharge.     Follow Up Recommendations DC plan and follow up therapy as arranged by surgeon    Equipment Recommendations  Rolling walker with 5" wheels    Recommendations for Other Services       Precautions / Restrictions Precautions Precautions: Fall Restrictions Weight Bearing Restrictions: Yes RLE Weight Bearing: Weight bearing as tolerated      Mobility  Bed Mobility Overal bed mobility: Needs Assistance Bed Mobility: Supine to Sit     Supine to sit: HOB elevated;Min assist     General bed mobility comments: assist for guiding RLE towards EOB, HOB elevated ( per OT evaluation)   Transfers Overall transfer level: Needs assistance Equipment used: Rolling walker (2 wheeled) Transfers: Sit to/from [facility] Sit to Stand: Min assist Stand pivot transfers: Mod assist       General transfer comment: min ato stand from chair. Patient required assit to correct his balance. He lost his balance to the  left trying to keep off the right.   Ambulation/Gait Ambulation/Gait assistance: Min guard Ambulation Distance (Feet): 18 Feet Assistive device: Rolling walker (2 wheeled) Gait Pattern/deviations: Decreased step length - right;Decreased stance time - right;Step-to pattern     General Gait Details: slow step to gait pattern. No LOB with gait   Stairs            Wheelchair Mobility    Modified Rankin (Stroke Patients Only)       Balance Overall balance assessment: Needs assistance Sitting-balance support: Feet supported Sitting balance-Leahy Scale: Good Sitting balance - Comments: sitting EOB   Standing balance support: Bilateral upper extremity supported Standing balance-Leahy Scale: Poor Standing balance comment: reliant on UE support at this time                              Pertinent Vitals/Pain Pain Assessment: Faces Faces Pain Scale: Hurts even more Pain Location: R ankle>hip Pain Descriptors / Indicators: Sore;Guarding Pain Intervention(s): Limited activity within patient's tolerance;Monitored during session;Patient requesting pain meds-RN notified    Home Living Family/patient expects to be discharged to:: Private residence Living Arrangements: Spouse/significant other Available Help at Discharge: Family;Available 24 hours/day Type of Home: House Home Access: Stairs to enter Entrance Stairs-Rails: Right;Left;Can reach both Entrance Stairs-Number of Steps: 4 Home Layout: One level;Laundry or work area in [place]: Crutches Additional Comments: wife reports that he needs supervision    Prior Function Level of Independence: Independent with assistive device(s)  Comments: Pt reports most recently using crutches for mobility due to previous ankle injuries      Hand Dominance   Dominant Hand: Right    Extremity/Trunk Assessment   Upper Extremity Assessment Upper Extremity Assessment: Defer to OT evaluation     Lower Extremity Assessment Lower Extremity Assessment: RLE deficits/detail RLE: Unable to fully assess due to pain       Communication   Communication: No difficulties  Cognition Arousal/Alertness: Awake/alert Behavior During Therapy: WFL for tasks assessed/performed Overall Cognitive Status: History of cognitive impairments - at baseline                                 General Comments: Pt with hx of alzheimer's       General Comments      Exercises     Assessment/Plan    PT Assessment Patient needs continued PT services  PT Problem List Decreased strength;Decreased range of motion;Decreased activity tolerance;Decreased balance;Decreased knowledge of use of DME;Decreased safety awareness;Pain       PT Treatment Interventions DME instruction;Gait training;Stair training;Functional mobility training;Therapeutic activities;Therapeutic exercise;Neuromuscular re-education;Patient/family education    PT Goals (Current goals can be found in the Care Plan section)  Acute Rehab PT Goals Patient Stated Goal: less pain, regain independence  PT Goal Formulation: With patient Time For Goal Achievement: 01/21/18 Potential to Achieve Goals: Good    Frequency 7X/week   Barriers to discharge Decreased caregiver support Patiens wife helped him before but he may require more help at home at this time     Co-evaluation               AM-PAC PT "6 Clicks" Daily Activity  Outcome Measure Difficulty turning over in bed (including adjusting bedclothes, sheets and blankets)?: A Lot Difficulty moving from lying on back to sitting on the side of the bed? : A Lot Difficulty sitting down on and standing up from a chair with arms (e.g., wheelchair, bedside commode, etc,.)?: A Little Help needed moving to and from a bed to chair (including a wheelchair)?: A Little Help needed walking in hospital room?: A Little Help needed climbing 3-5 steps with a railing? : A Lot 6  Click Score: 15    End of Session Equipment Utilized During Treatment: Gait belt Activity Tolerance: Patient limited by pain Patient left: in chair;with chair alarm set;with call bell/phone within reach Nurse Communication: Mobility status PT Visit Diagnosis: Unsteadiness on feet (R26.81);History of falling (Z91.81);Difficulty in walking, not elsewhere classified (R26.2);Pain Pain - Right/Left: Right Pain - part of body: Ankle and joints of foot;Hip    Time: 4401-0272 PT Time Calculation (min) (ACUTE ONLY): 23 min   Charges:   PT Evaluation $PT Eval Moderate Complexity: 1 Mod     PT G Codes:          Carney Living PT DPT  01/14/2018, 12:27 PM

## 2018-01-14 NOTE — Care Management Note (Addendum)
Case Management Note  Patient Details  Name: Austin Dillon MRN: [id number] Date of Birth: 08/12/1951  Subjective/Objective:       Pt presented with hip fx after fall due to using crutches.  Pt on crutches since 4/18 for right ankle fx.  Pt from home with wife, which is in [facility]).  Pt states he has a home at a [place] address but is not staying there.  Pt has had [facility] since April of 2018 but does not know what agency therapist is from- possibly [facility].  Pt would like to continue with current therapist if goes home but is also considering SNF.  Pt states wife is not strong enough to assist him and he is concerned about 3 steps to get in the house.           Action/Plan: Discussed HH vs SNF with pt and encouraged pt to find out name of agency HHPT works for.  Pt states he will discuss options with wife.  Expected Discharge Date:                  Expected Discharge Plan:  [place]  In-House Referral:  NA  Discharge planning Services  CM Consult  Post Acute Care Choice:    Choice offered to:     DME Arranged:    DME Agency:     HH Arranged:    HH Agency:     Status of Service:     If discussed at [employer], dates discussed:    Additional Comments:  Claudie Leach, RN 01/14/2018, 3:48 PM

## 2018-01-14 NOTE — Progress Notes (Signed)
ANTIBIOTIC CONSULT NOTE - INITIAL  Pharmacy Consult for Vanco/Zosyn Indication: post-op fever  Allergies  Allergen Reactions  . Nortriptyline Other (See Comments)    Severe mood swings  . Aricept [Donepezil] Other (See Comments)    Makes the patient look like a "mummy" (too sedating)    Patient Measurements: Height: 5\' 8"  (172.7 cm) Weight: 180 lb (81.6 kg) IBW/kg (Calculated) : 68.4 Adjusted Body Weight:    Vital Signs: Temp: 101.1 F (38.4 C) (02/10 0442) Temp Source: Oral (02/10 0442) BP: 137/65 (02/10 0442) Pulse Rate: 91 (02/10 0442) Intake/Output from previous day: 02/09 0701 - 02/10 0700 In: 1600 [I.V.:1350; IV Piggyback:250] Out: 1600 [Urine:1250; Blood:350] Intake/Output from this shift: No intake/output data recorded.  Labs: Recent Labs    01/12/18 1614 01/13/18 0601  WBC 13.2* 10.5  HGB 15.1 14.0  PLT 220 188  CREATININE 0.89 0.97   Estimated Creatinine Clearance: 72.5 mL/min (by C-G formula based on SCr of 0.97 mg/dL). No results for input(s): VANCOTROUGH, VANCOPEAK, VANCORANDOM, GENTTROUGH, GENTPEAK, GENTRANDOM, TOBRATROUGH, TOBRAPEAK, TOBRARND, AMIKACINPEAK, AMIKACINTROU, AMIKACIN in the last 72 hours.   Microbiology: Recent Results (from the past 720 hour(s))  Surgical pcr screen     Status: None   Collection Time: 01/13/18 12:11 AM  Result Value Ref Range Status   MRSA, PCR NEGATIVE NEGATIVE Final   Staphylococcus aureus NEGATIVE NEGATIVE Final    Comment: (NOTE) The Xpert SA Assay (FDA approved for NASAL specimens in patients 15 years of age and older), is one component of a comprehensive surveillance program. It is not intended to diagnose infection nor to guide or monitor treatment. Performed at [facility], [place] [address], [place], [place] [address]     Medical History: Past Medical History:  Diagnosis Date  . Alzheimer disease   . Headache     Assessment: CC/HPI: R THR 2/9  PMH: Alzheimer', HAs  ID: Vanco/Zosyn for  post-op fever 101.1. WBC 10.5 on 9/9. CrCl 72  Goal of Therapy:  Vancomycin trough level 15-20 mcg/ml  Plan:  Vanco 1g IV q 12 hrs. Trough at steady state. Zosyn 3.75g IV q 8hrs F/u labs for 2/10, lactic acid, flu panel, Blood cultures  Mckensie Scotti S. Alford Highland, PharmD, [facility] Clinical Staff Pharmacist Pager [phone]  Eilene Ghazi Stillinger 01/14/2018,9:00 AM

## 2018-01-14 NOTE — Progress Notes (Signed)
Orthopedic Tech Progress Note Patient Details:  Austin Dillon 1951-04-04 [id number]  Ortho Devices Ortho Device/Splint Location: Trapeze bar Ortho Device/Splint Interventions: Application   Post Interventions Patient Tolerated: Well Instructions Provided: Care of device, Adjustment of device   [place] Pink 01/14/2018, 8:39 AM

## 2018-01-14 NOTE — Evaluation (Signed)
Occupational Therapy Evaluation Patient Details Name: Austin Dillon MRN: [id number] DOB: 1951/10/16 Today's Date: 01/14/2018    History of Present Illness Pt is a 67 y/o M PMHx significant for Alzheimer's disease, chronic headaches, depression with anxiety, and open bimalleolar right ankle fracture last spring status post ORIF, Pt presented to the ED with severe right hip pain after a fall at home; found to have displaced right hip femoral neck fracture. Pt is now s/p R anterior THA on 01/13/18   Clinical Impression   This 67 y/o M presents with the above. Pt reports he has most recently been modI with ADLs and mobility using crutches. Pt presenting this session with increased pain in RLE, decreased dynamic balance and functional performance. Pt completing stand pivot transfers during session at RW level with ModA, though mostly maintaining NWB through RLE during transfer due to ankle pain from previous injuries; currently requires MaxA for toileting and LB ADLs. Pt will benefit from continued acute OT services and recommend SNF level therapies after discharge to maximize his safety and independence with ADLs and mobility prior to return home.     Follow Up Recommendations  SNF;Supervision/Assistance - 24 hour    Equipment Recommendations  Other (comment)(defer to next venue )           Precautions / Restrictions Precautions Precautions: Fall Restrictions Weight Bearing Restrictions: Yes RLE Weight Bearing: Weight bearing as tolerated      Mobility Bed Mobility Overal bed mobility: Needs Assistance Bed Mobility: Supine to Sit     Supine to sit: HOB elevated;Min assist     General bed mobility comments: assist for guiding RLE towards EOB, HOB elevated   Transfers Overall transfer level: Needs assistance Equipment used: Rolling walker (2 wheeled) Transfers: Sit to/from [facility] Sit to Stand: Mod assist;From elevated surface Stand pivot transfers: Mod  assist       General transfer comment: Assist to rise and steady at RW; steadying assist during stand pivot transfers, Pt completing stand pivot EOB>BSC>recliner, Pt maintaining NWB in RLE due to ankle pain during transfer, taking small hops on LLE using RW    Balance Overall balance assessment: Needs assistance Sitting-balance support: Feet supported Sitting balance-Leahy Scale: Good Sitting balance - Comments: sitting EOB   Standing balance support: Bilateral upper extremity supported Standing balance-Leahy Scale: Poor Standing balance comment: reliant on UE support at this time                            ADL either performed or assessed with clinical judgement   ADL Overall ADL's : Needs assistance/impaired Eating/Feeding: Set up;Sitting   Grooming: Wash/dry face;Set up;Sitting   Upper Body Bathing: Min guard;Sitting   Lower Body Bathing: Moderate assistance;Sit to/from stand   Upper Body Dressing : Set up;Sitting   Lower Body Dressing: Maximal assistance;Sit to/from stand Lower Body Dressing Details (indicate cue type and reason): total assist to don R sock at bed level  Toilet Transfer: Moderate assistance;Stand-pivot;BSC   Toileting- Clothing Manipulation and Hygiene: Maximal assistance;Sit to/from stand       Functional mobility during ADLs: Moderate assistance;Rolling walker General ADL Comments: Pt completed stand pivot EOB>BSC>recliner; Pt maintaining mostly NWB due to recent ankle injury (per spouse ankle sprain)                         Pertinent Vitals/Pain Pain Assessment: Faces Faces Pain Scale: Hurts even more Pain Location:  R ankle>hip Pain Descriptors / Indicators: Sore;Guarding Pain Intervention(s): Limited activity within patient's tolerance;Monitored during session;Patient requesting pain meds-RN notified     Hand Dominance Right   Extremity/Trunk Assessment Upper Extremity Assessment Upper Extremity Assessment: Generalized  weakness;Overall [facility] for tasks assessed   Lower Extremity Assessment Lower Extremity Assessment: Defer to PT evaluation       Communication Communication Communication: No difficulties   Cognition Arousal/Alertness: Awake/alert Behavior During Therapy: WFL for tasks assessed/performed Overall Cognitive Status: History of cognitive impairments - at baseline                                 General Comments: Pt with hx of alzheimer's    General Comments                  Home Living Family/patient expects to be discharged to:: Private residence Living Arrangements: Spouse/significant other Available Help at Discharge: Family;Available 24 hours/day Type of Home: House Home Access: Stairs to enter Technical brewer of Steps: 4         Bathroom Shower/Tub: Teacher, early years/pre: Standard     Home Equipment: Crutches          Prior Functioning/Environment Level of Independence: Independent with assistive device(s)        Comments: Pt reports most recently using crutches for mobility due to previous ankle injuries         OT Problem List: Decreased strength;Decreased activity tolerance;Decreased range of motion;Decreased knowledge of use of DME or AE;Impaired balance (sitting and/or standing);Pain      OT Treatment/Interventions: Self-care/ADL training;DME and/or AE instruction;Therapeutic activities;Balance training;Therapeutic exercise;Energy conservation;Patient/family education    OT Goals(Current goals can be found in the care plan section) Acute Rehab OT Goals Patient Stated Goal: less pain, regain independence  OT Goal Formulation: With patient Time For Goal Achievement: 01/28/18 Potential to Achieve Goals: Good  OT Frequency: Min 2X/week                             AM-PAC PT "6 Clicks" Daily Activity     Outcome Measure Help from another person eating meals?: None Help from another person taking care of  personal grooming?: A Little Help from another person toileting, which includes using toliet, bedpan, or urinal?: A Lot Help from another person bathing (including washing, rinsing, drying)?: A Lot Help from another person to put on and taking off regular upper body clothing?: None Help from another person to put on and taking off regular lower body clothing?: A Lot 6 Click Score: 17   End of Session Equipment Utilized During Treatment: Gait belt;Rolling walker Nurse Communication: Mobility status  Activity Tolerance: Patient tolerated treatment well Patient left: in chair;with call bell/phone within reach;with chair alarm set  OT Visit Diagnosis: Other abnormalities of gait and mobility (R26.89);History of falling (Z91.81)                Time: 2585-2778 OT Time Calculation (min): 33 min Charges:  OT General Charges $OT Visit: 1 Visit OT Evaluation $OT Eval Moderate Complexity: 1 Mod OT Treatments $Self Care/Home Management : 8-22 mins G-Codes:     Lou Cal, OT Pager [phone] 01/14/2018   Raymondo Band 01/14/2018, 10:52 AM

## 2018-01-14 NOTE — Progress Notes (Signed)
Subjective: 1 Day Post-Op Procedure(s) (LRB): RIGHT ANTERIOR TOTAL HIP REPLACEMENT (Right) Patient reports pain as moderate.  Tolerated surgery very well.  Ready for attempts to mobilize with therapy.  Objective: Vital signs in last 24 hours: Temp:  [97.2 F (36.2 C)-101.1 F (38.4 C)] 101.1 F (38.4 C) (02/10 0442) Pulse Rate:  [76-96] 91 (02/10 0442) Resp:  [15-25] 16 (02/10 0442) BP: (122-165)/(57-76) 137/65 (02/10 0442) SpO2:  [93 %-100 %] 96 % (02/10 0442) Weight:  [180 lb (81.6 kg)] 180 lb (81.6 kg) (02/09 1040)  Intake/Output from previous day: 02/09 0701 - 02/10 0700 In: 1600 [I.V.:1350; IV Piggyback:250] Out: 1600 [Urine:1250; Blood:350] Intake/Output this shift: No intake/output data recorded.  Recent Labs    01/12/18 1614 01/13/18 0601  HGB 15.1 14.0   Recent Labs    01/12/18 1614 01/13/18 0601  WBC 13.2* 10.5  RBC 4.46 4.24  HCT 43.4 41.9  PLT 220 188   Recent Labs    01/12/18 1614 01/13/18 0601  NA 134* 137  K 4.1 4.2  CL 99* 100*  CO2 26 27  BUN 7 7  CREATININE 0.89 0.97  GLUCOSE 126* 105*  CALCIUM 9.4 8.9   No results for input(s): LABPT, INR in the last 72 hours.  Sensation intact distally Intact pulses distally Dorsiflexion/Plantar flexion intact Incision: dressing C/D/I  Assessment/Plan: 1 Day Post-Op Procedure(s) (LRB): RIGHT ANTERIOR TOTAL HIP REPLACEMENT (Right) Up with therapy - WBAT right hip, no precautions Limited by his chronic right ankle issues - can still WBAT on this Only aspirin as DVT coverage due to fall risk and bleeding risk  Mcarthur Rossetti 01/14/2018, 8:47 AM

## 2018-01-14 NOTE — Progress Notes (Signed)
[facility] PROGRESS NOTE  Austin Dillon YIR:[id number] DOB: [birth year] DOA: 01/12/2018 PCP: Baxter Hire, MD  Assessment/Plan:  Fever - Lactic acid x2 normal -Blood cultures drawn x2 (2/10) -Started on empiric vancomycin and Zosyn -Chest x-ray negative - UA neg - flu neg  Right hip fracture , postop day 1 - Presents with severe right hip pain after a fall at home the night of 01/11/18  - Orthopedic surgery is consulting and much appreciated  - As needed oxycodone, norco and Robaxin for pain control   Depression with anxiety  - Stable  - Continue Lexapro, Cymbalta, and prn Xanax    Open bimalleolar ankle fracture, right - Status-post ORIF  - Delayed wound healing, does not appear acutely infected on admission  - Continue supportive care and ortho follow-up    DVT prophylaxis: SCD's  Code Status: Full  Family Communication: Wife updated at bedside Disposition Plan: Admit to med-surg Consults called: Orthopedic surgery Admission status: Inpatient      Consultants:  Orthopedics  Procedures:  Right anterior hip replacement February 9  Antibiotics:  ---  HPI/Subjective: Patient states he feels well. No CP.  Objective: Vitals:   01/14/18 0442 01/14/18 1504  BP: 137/65 (!) 117/50  Pulse: 91 72  Resp: 16 16  Temp: (S) (!) 101.1 F (38.4 C) 98.8 F (37.1 C)  SpO2: 96% 98%    Intake/Output Summary (Last 24 hours) at 01/14/2018 1816 Last data filed at 01/14/2018 0531 Gross per 24 hour  Intake -  Output 1150 ml  Net -1150 ml   Filed Weights   01/13/18 1040  Weight: 81.6 kg (180 lb)    Exam:   General:  NCAT, NAD, alert  Cardiovascular: RRR, no MRG  Respiratory: CTAB, nl wob  Abdomen: NS+, ND  Musculoskeletal: able to move feet, dressing on R hip CDI   Data Reviewed: Basic Metabolic Panel: Recent Labs  Lab 01/12/18 1614 01/13/18 0601 01/14/18 0936  NA 134* 137 138  K 4.1 4.2 5.0  CL 99* 100* 102  CO2 26 27 28    GLUCOSE 126* 105* 135*  BUN 7 7 5*  CREATININE 0.89 0.97 0.97  CALCIUM 9.4 8.9 8.6*   Liver Function Tests: No results for input(s): AST, ALT, ALKPHOS, BILITOT, PROT, ALBUMIN in the last 168 hours. No results for input(s): LIPASE, AMYLASE in the last 168 hours. No results for input(s): AMMONIA in the last 168 hours. CBC: Recent Labs  Lab 01/12/18 1614 01/13/18 0601 01/14/18 0936  WBC 13.2* 10.5 8.6  NEUTROABS 11.8*  --  5.7  HGB 15.1 14.0 10.5*  HCT 43.4 41.9 33.1*  MCV 97.4 98.8 102.2*  PLT 220 188 168   Cardiac Enzymes: No results for input(s): CKTOTAL, CKMB, CKMBINDEX, TROPONINI in the last 168 hours. BNP (last 3 results) No results for input(s): BNP in the last 8760 hours.  ProBNP (last 3 results) No results for input(s): PROBNP in the last 8760 hours.  CBG: No results for input(s): GLUCAP in the last 168 hours.  Recent Results (from the past 240 hour(s))  Surgical pcr screen     Status: None   Collection Time: 01/13/18 12:11 AM  Result Value Ref Range Status   MRSA, PCR NEGATIVE NEGATIVE Final   Staphylococcus aureus NEGATIVE NEGATIVE Final    Comment: (NOTE) The Xpert SA Assay (FDA approved for NASAL specimens in patients 60 years of age and older), is one component of a comprehensive surveillance program. It is not intended to diagnose infection nor  to guide or monitor treatment. Performed at [facility], [address] [place], [place] [address]      Studies: Pelvis Portable  Result Date: 01/13/2018 CLINICAL DATA:  Postop right total hip replacement EXAM: PORTABLE PELVIS 1-2 VIEWS COMPARISON:  01/12/2018 FINDINGS: Changes of right hip replacement. Normal AP alignment. No hardware or bony complicating feature. IMPRESSION: Right hip replacement.  No complicating feature. Electronically Signed   By: Rolm Baptise M.D.   On: 01/13/2018 12:23   Dg Chest Port 1 View  Result Date: 01/14/2018 CLINICAL DATA:  New onset of fever. EXAM: PORTABLE CHEST 1  VIEW COMPARISON:  01/12/2018 chest radiograph FINDINGS: The cardiomediastinal silhouette is unremarkable. There is no evidence of focal airspace disease, pulmonary edema, suspicious pulmonary nodule/mass, pleural effusion, or pneumothorax. No acute bony abnormalities are identified. IMPRESSION: No active disease. Electronically Signed   By: Margarette Canada M.D.   On: 01/14/2018 09:20   Dg C-arm 1-60 Min  Result Date: 01/13/2018 CLINICAL DATA:  Right hip replacement EXAM: DG C-ARM 61-120 MIN; OPERATIVE RIGHT HIP WITH PELVIS COMPARISON:  01/12/2018 FINDINGS: Changes of right hip replacement. Normal AP alignment. No hardware or bony complicating feature. IMPRESSION: Right hip replacement.  No complicating feature. Electronically Signed   By: Rolm Baptise M.D.   On: 01/13/2018 12:22   Dg Hip Operative Unilat With Pelvis Right  Result Date: 01/13/2018 CLINICAL DATA:  Right hip replacement EXAM: DG C-ARM 61-120 MIN; OPERATIVE RIGHT HIP WITH PELVIS COMPARISON:  01/12/2018 FINDINGS: Changes of right hip replacement. Normal AP alignment. No hardware or bony complicating feature. IMPRESSION: Right hip replacement.  No complicating feature. Electronically Signed   By: Rolm Baptise M.D.   On: 01/13/2018 12:22    Scheduled Meds: . aspirin EC  325 mg Oral BID PC  . cholecalciferol  1,000 Units Oral QHS  . DULoxetine  20 mg Oral Daily  . lamoTRIgine  100 mg Oral Daily  . lamoTRIgine  150 mg Oral QHS  . memantine  10 mg Oral BID  . mupirocin ointment  1 application Topical BID   Continuous Infusions: . sodium chloride 10 mL/hr at 01/14/18 1052  . lactated ringers 10 mL/hr at 01/13/18 0848  . piperacillin-tazobactam (ZOSYN)  IV 3.375 g (01/14/18 1739)  . vancomycin Stopped (01/14/18 1202)    Principal Problem:   Closed right hip fracture, initial encounter [facility]) Active Problems:   Open fracture ankle, bimalleolar, right, type III, with delayed healing, subsequent encounter   Depression with  anxiety    Time spent: [place]. If 7PM-7AM, please contact night-coverage at [email or web address], password [email or web address] 01/14/2018, 6:16 PM  LOS: 2 days

## 2018-01-15 ENCOUNTER — Encounter (HOSPITAL_COMMUNITY): Payer: Self-pay | Admitting: Orthopaedic Surgery

## 2018-01-15 DIAGNOSIS — D649 Anemia, unspecified: Secondary | ICD-10-CM

## 2018-01-15 LAB — CBC
HEMATOCRIT: 28.6 % — AB (ref 39.0–52.0)
HEMOGLOBIN: 9.3 g/dL — AB (ref 13.0–17.0)
MCH: 32.9 pg (ref 26.0–34.0)
MCHC: 32.5 g/dL (ref 30.0–36.0)
MCV: 101.1 fL — AB (ref 78.0–100.0)
Platelets: 159 10*3/uL (ref 150–400)
RBC: 2.83 MIL/uL — ABNORMAL LOW (ref 4.22–5.81)
RDW: 12.2 % (ref 11.5–15.5)
WBC: 8.4 10*3/uL (ref 4.0–10.5)

## 2018-01-15 LAB — BASIC METABOLIC PANEL
Anion gap: 12 (ref 5–15)
BUN: 6 mg/dL (ref 6–20)
CALCIUM: 8.7 mg/dL — AB (ref 8.9–10.3)
CHLORIDE: 99 mmol/L — AB (ref 101–111)
CO2: 28 mmol/L (ref 22–32)
Creatinine, Ser: 1.03 mg/dL (ref 0.61–1.24)
GFR calc non Af Amer: >60 mL/min (ref >60)
GLUCOSE: 119 mg/dL — AB (ref 65–99)
POTASSIUM: 4.4 mmol/L (ref 3.5–5.1)
Sodium: 139 mmol/L (ref 135–145)

## 2018-01-15 LAB — OCCULT BLOOD X 1 CARD TO LAB, STOOL: Fecal Occult Bld: NEGATIVE

## 2018-01-15 MED ORDER — ENSURE ENLIVE PO LIQD
237.0000 mL | Freq: Every day | ORAL | Status: DC
  Administered 2018-01-15 – 2018-01-16 (×2): 237 mL via ORAL

## 2018-01-15 NOTE — Progress Notes (Signed)
Physical Therapy Treatment Patient Details Name: Austin Dillon MRN: [id number] DOB: 08-16-1951 Today's Date: 01/15/2018    History of Present Illness Pt is a 67 y/o M PMHx significant for Alzheimer's disease, chronic headaches, depression with anxiety, and open bimalleolar right ankle fracture last spring status post ORIF, Pt presented to the ED with severe right hip pain after a fall at home; found to have displaced right hip femoral neck fracture. Pt is now s/p R anterior THA on 01/13/18    PT Comments    Patient is making progress toward mobility goals. Pt continues to maintain NWB R LE and pt's wife (present during session) insists pt is supposed to be NWB on R ankle. Pt and wife educated that pt is WBAT on R LE per Dr. Ninfa Linden. Pt and wife may need some clarification on weight bearing status for R ankle. Wife would like compression sock on R LE and therapist notified RN of the concern as this is not in the order set and order for SCD L side only. Continue to progress as tolerated.    Follow Up Recommendations  DC plan and follow up therapy as arranged by surgeon     Equipment Recommendations  Rolling walker with 5" wheels    Recommendations for Other Services       Precautions / Restrictions Precautions Precautions: Fall Restrictions Weight Bearing Restrictions: Yes RLE Weight Bearing: Weight bearing as tolerated    Mobility  Bed Mobility Overal bed mobility: Needs Assistance Bed Mobility: Supine to Sit     Supine to sit: HOB elevated;Min guard     General bed mobility comments: min guard for safety  Transfers Overall transfer level: Needs assistance Equipment used: Rolling walker (2 wheeled) Transfers: Sit to/from [facility] Sit to Stand: Min guard         General transfer comment: min guard for safety; cues for safe hand placement  Ambulation/Gait Ambulation/Gait assistance: Min guard Ambulation Distance (Feet): 100 Feet Assistive  device: Rolling walker (2 wheeled) Gait Pattern/deviations: Decreased step length - right;Decreased stance time - right;Step-to pattern;Decreased step length - left Gait velocity: decreased   General Gait Details: cues for posture and sequencing; pt maintained NWB R LE with TDWB at times   Stairs            Wheelchair Mobility    Modified Rankin (Stroke Patients Only)       Balance Overall balance assessment: Needs assistance Sitting-balance support: Feet supported Sitting balance-Leahy Scale: Good     Standing balance support: Bilateral upper extremity supported Standing balance-Leahy Scale: Poor Standing balance comment: reliant on UE support at this time                             Cognition Arousal/Alertness: Awake/alert Behavior During Therapy: WFL for tasks assessed/performed Overall Cognitive Status: History of cognitive impairments - at baseline                                 General Comments: Pt with hx of alzheimer's       Exercises Total Joint Exercises Hip ABduction/ADduction: AROM;Right;10 reps;Standing Knee Flexion: AROM;Right;10 reps;Standing Marching in Standing: AROM;Right;10 reps;Standing    General Comments General comments (skin integrity, edema, etc.): pt with R ankle edema and erythema      Pertinent Vitals/Pain Pain Assessment: Faces Faces Pain Scale: Hurts little more Pain Location: R ankle>hip  Pain Descriptors / Indicators: Sore;Guarding Pain Intervention(s): Limited activity within patient's tolerance;Monitored during session;Premedicated before session;Repositioned    Home Living                      Prior Function            PT Goals (current goals can now be found in the care plan section) Acute Rehab PT Goals PT Goal Formulation: With patient Time For Goal Achievement: 01/21/18 Potential to Achieve Goals: Good Progress towards PT goals: Progressing toward goals    Frequency     7X/week      PT Plan Current plan remains appropriate    Co-evaluation              AM-PAC PT "6 Clicks" Daily Activity  Outcome Measure  Difficulty turning over in bed (including adjusting bedclothes, sheets and blankets)?: A Lot Difficulty moving from lying on back to sitting on the side of the bed? : A Lot Difficulty sitting down on and standing up from a chair with arms (e.g., wheelchair, bedside commode, etc,.)?: A Little Help needed moving to and from a bed to chair (including a wheelchair)?: A Little Help needed walking in hospital room?: A Little Help needed climbing 3-5 steps with a railing? : A Lot 6 Click Score: 15    End of Session Equipment Utilized During Treatment: Gait belt Activity Tolerance: Patient tolerated treatment well Patient left: in chair;with call bell/phone within reach;with family/visitor present Nurse Communication: Mobility status PT Visit Diagnosis: Unsteadiness on feet (R26.81);History of falling (Z91.81);Difficulty in walking, not elsewhere classified (R26.2);Pain Pain - Right/Left: Right Pain - part of body: Ankle and joints of foot;Hip     Time: 0981-1914 PT Time Calculation (min) (ACUTE ONLY): 27 min  Charges:  $Gait Training: 8-22 mins $Therapeutic Exercise: 8-22 mins                    G Codes:       Earney Navy, PTA Pager: [phone]     Darliss Cheney 01/15/2018, 10:11 AM

## 2018-01-15 NOTE — Plan of Care (Signed)
  Nutrition: Adequate nutrition will be maintained 01/15/2018 1240 - Progressing by Williams Che, RN   Elimination: Will not experience complications related to bowel motility 01/15/2018 1240 - Progressing by Williams Che, RN   Pain Managment: General experience of comfort will improve 01/15/2018 1240 - Progressing by Williams Che, RN   Safety: Ability to remain free from injury will improve 01/15/2018 1240 - Progressing by Williams Che, RN   Skin Integrity: Risk for impaired skin integrity will decrease 01/15/2018 1240 - Progressing by Williams Che, RN

## 2018-01-15 NOTE — Progress Notes (Signed)
[facility] PROGRESS NOTE  Austin Dillon WUJ:[id number] DOB: Feb 07, 1951 DOA: 01/12/2018 PCP: Baxter Hire, MD  Assessment/Plan:  Fever - Lactic acid x2 normal -Blood cultures drawn x2 (2/10), NGTD -Cont  on empiric vancomycin and Zosyn -Chest x-ray negative - UA neg - flu neg - if cult neg tomorrow will stop abx and pt can be discharged  Anemia  15.1 admit ---> 9.3 hgb today Worsening slowly Will check FOBT UA neg yesterday Pt is not symptomatic from anemia  Right hip fracture , postop day 2 - Presents with severe right hip pain after a fall at home the night of 01/11/18  - Orthopedic surgery is consulting and much appreciated  - As needed oxycodone, norco and Robaxin for pain control   Depression with anxiety  - Stable  - Continue Lexapro, Cymbalta, and prn Xanax    Open bimalleolar ankle fracture, right - Status-post ORIF  - Delayed wound healing, does not appear acutely infected on admission  - Continue supportive care and ortho follow-up    DVT prophylaxis: SCD's  Code Status: Full  Family Communication: none available Disposition Plan: Admit to med-surg Consults called: Orthopedic surgery Admission status: Inpatient      Consultants:  Orthopedics  Procedures:  Right anterior hip replacement February 9  Antibiotics:  ---  HPI/Subjective: Patient states he feels well. No CP. Hip pain at surgical site.  Objective: Vitals:   01/14/18 2037 01/15/18 0445  BP: 130/63 115/60  Pulse: 82 67  Resp: 16 16  Temp: 99.4 F (37.4 C) 98.6 F (37 C)  SpO2: 98% 98%    Intake/Output Summary (Last 24 hours) at 01/15/2018 1304 Last data filed at 01/15/2018 0855 Gross per 24 hour  Intake 356 ml  Output 750 ml  Net -394 ml   Filed Weights   01/13/18 1040  Weight: 81.6 kg (180 lb)    Exam:   General:  NCAT, NAD, alert &Ox3  Cardiovascular: RRR, no MRG  Respiratory: CTAB, nl wob  Abdomen: NS+, ND  Musculoskeletal: able to move  feet, dressing on R hip CDI   Data Reviewed: Basic Metabolic Panel: Recent Labs  Lab 01/12/18 1614 01/13/18 0601 01/14/18 0936 01/15/18 0617  NA 134* 137 138 139  K 4.1 4.2 5.0 4.4  CL 99* 100* 102 99*  CO2 26 27 28 28   GLUCOSE 126* 105* 135* 119*  BUN 7 7 5* 6  CREATININE 0.89 0.97 0.97 1.03  CALCIUM 9.4 8.9 8.6* 8.7*   Liver Function Tests: No results for input(s): AST, ALT, ALKPHOS, BILITOT, PROT, ALBUMIN in the last 168 hours. No results for input(s): LIPASE, AMYLASE in the last 168 hours. No results for input(s): AMMONIA in the last 168 hours. CBC: Recent Labs  Lab 01/12/18 1614 01/13/18 0601 01/14/18 0936 01/15/18 0617  WBC 13.2* 10.5 8.6 8.4  NEUTROABS 11.8*  --  5.7  --   HGB 15.1 14.0 10.5* 9.3*  HCT 43.4 41.9 33.1* 28.6*  MCV 97.4 98.8 102.2* 101.1*  PLT 220 188 168 159   Cardiac Enzymes: No results for input(s): CKTOTAL, CKMB, CKMBINDEX, TROPONINI in the last 168 hours. BNP (last 3 results) No results for input(s): BNP in the last 8760 hours.  ProBNP (last 3 results) No results for input(s): PROBNP in the last 8760 hours.  CBG: No results for input(s): GLUCAP in the last 168 hours.  Recent Results (from the past 240 hour(s))  Surgical pcr screen     Status: None   Collection Time: 01/13/18  12:11 AM  Result Value Ref Range Status   MRSA, PCR NEGATIVE NEGATIVE Final   Staphylococcus aureus NEGATIVE NEGATIVE Final    Comment: (NOTE) The Xpert SA Assay (FDA approved for NASAL specimens in patients 67 years of age and older), is one component of a comprehensive surveillance program. It is not intended to diagnose infection nor to guide or monitor treatment. Performed at [facility], [place] [address] [place], [place] [address]   Culture, blood (Routine X 2) w Reflex to ID Panel     Status: None (Preliminary result)   Collection Time: 01/14/18  9:36 AM  Result Value Ref Range Status   Specimen Description BLOOD RIGHT HAND  Final   Special  Requests   Final    BOTTLES DRAWN AEROBIC ONLY Blood Culture results may not be optimal due to an excessive volume of blood received in culture bottles   Culture   Final    NO GROWTH 1 DAY Performed at [facility], [place] [address], [place], [place] [address]    Report Status PENDING  Incomplete  Culture, blood (Routine X 2) w Reflex to ID Panel     Status: None (Preliminary result)   Collection Time: 01/14/18  9:41 AM  Result Value Ref Range Status   Specimen Description BLOOD RIGHT ANTECUBITAL  Final   Special Requests   Final    BOTTLES DRAWN AEROBIC ONLY Blood Culture results may not be optimal due to an excessive volume of blood received in culture bottles   Culture   Final    NO GROWTH 1 DAY Performed at [facility], [place] [address] [place], [place] [address]    Report Status PENDING  Incomplete     Studies: Dg Chest Port 1 View  Result Date: 01/14/2018 CLINICAL DATA:  New onset of fever. EXAM: PORTABLE CHEST 1 VIEW COMPARISON:  01/12/2018 chest radiograph FINDINGS: The cardiomediastinal silhouette is unremarkable. There is no evidence of focal airspace disease, pulmonary edema, suspicious pulmonary nodule/mass, pleural effusion, or pneumothorax. No acute bony abnormalities are identified. IMPRESSION: No active disease. Electronically Signed   By: Margarette Canada M.D.   On: 01/14/2018 09:20    Scheduled Meds: . aspirin EC  325 mg Oral BID PC  . cholecalciferol  1,000 Units Oral QHS  . DULoxetine  20 mg Oral Daily  . lamoTRIgine  100 mg Oral Daily  . lamoTRIgine  150 mg Oral QHS  . memantine  10 mg Oral BID  . mupirocin ointment  1 application Topical BID   Continuous Infusions: . sodium chloride 10 mL/hr at 01/14/18 1052  . lactated ringers 10 mL/hr at 01/13/18 0848  . piperacillin-tazobactam (ZOSYN)  IV 3.375 g (01/15/18 1042)  . vancomycin 1,000 mg (01/15/18 1042)    Principal Problem:   Closed right hip fracture, initial encounter [facility]) Active Problems:    Open fracture ankle, bimalleolar, right, type III, with delayed healing, subsequent encounter   Depression with anxiety    Time spent: [place]. If 7PM-7AM, please contact night-coverage at [email or web address], password [facility] 01/15/2018, 1:04 PM  LOS: 3 days

## 2018-01-15 NOTE — Op Note (Signed)
Austin Dillon, REINWALD NO.:  [id number]  MEDICAL RECORD NO.:  [id number]  LOCATION:                                 FACILITY:  PHYSICIAN:  Lind Guest. Ninfa Linden, M.D.DATE OF BIRTH:  DATE OF PROCEDURE:  01/13/2018 DATE OF DISCHARGE:                              OPERATIVE REPORT   PREOPERATIVE DIAGNOSIS:  Displaced right hip femoral neck fracture.  POSTOPERATIVE DIAGNOSIS:  Displaced right hip femoral neck fracture.  PROCEDURE:  Right total hip arthroplasty through direct anterior approach.  IMPLANTS:  DePuy Sector Gription acetabular component size 56, size 36/+0 neutral polyethylene liner, size 13 Corail femoral component with varus offset, size 36/+5 metal hip ball.  SURGEON:  Lind Guest. Ninfa Linden, MD.  ANESTHESIA:  General.  ANTIBIOTICS:  2 g of IV Ancef.  BLOOD LOSS:  250-300 mL.  COMPLICATIONS:  None.  INDICATIONS:  Mr. Beilke is a very pleasant 67 year old gentleman, who sustained a mechanical fall the night before last at home when he slipped on his crutches.  He has been dealing with a chronic right ankle fracture.  After this fall, he stayed on the couch and yesterday with inability to ambulate and severe right hip pain, he was seen at the outlying hospital.  He has had all the orthopedic care here in [place].  He was transferred here to the Medicine Service.  He now presents for definitive treatment of his right hip displaced femoral neck fracture.  I talked about the treatment options with the main option being a right total hip arthroplasty in general based on his young age.  The risks and benefits of surgery were explained to him in detail and he does wish to proceed.  PROCEDURE DESCRIPTION:  After informed consent was obtained and appropriate right hip was marked, he was brought to the operating room where general anesthesia was obtained while he was on the stretcher. Traction boots were placed on both his feet.  Next, he was  placed supine on the Hana fracture table, the perineal post in place and both legs in in-line skeletal traction devices, but no traction applied.  His right operative hip was prepped and draped with DuraPrep and sterile drapes. Time-out was called.  He was identified as correct patient and correct right hip.  I then made incision just inferior and posterior to the anterior superior iliac spine and carried this obliquely down the leg. We dissected down tensor fascia lata muscle.  The tensor fascia was then divided longitudinally to proceed with a direct anterior approach to the hip.  We identified and cauterized circumflex vessels and then identified the hip capsule.  I opened up the hip capsule in an L-type format, finding a large hematoma consistent with his displaced femoral neck fracture and you could see once we opened up the joint capsule the femoral neck fracture.  We placed Cobra retractors around the medial and lateral femoral neck and then made our freshening femoral neck cut just proximal to the lesser trochanter and completed this on osteotome.  We made a cut with the oscillating saw.  We then placed a corkscrew guide in the femoral head and removed the femoral head in its entirety.  I then cleaned the acetabulum of bony fragments and other debris including remnants of the acetabular labrum.  I placed a bent Hohmann over the medial acetabular rim and then began reaming under direct visualization from a size 43 reamer and several increments going all the way up to a size 56 with all reamers under direct visualization and the last 2 reamers under direct fluoroscopy, so we could obtain our depth of reaming, our inclination, and anteversion.  Once I was pleased with this, I placed the real DePuy Sector Gription acetabular component size 56 and then a 36/+0 neutral polyethylene liner for that size acetabular component.  Attention was then turned to the femur.  With the  leg externally rotated to 120 degrees, extended and adducted, we were able to place a Mueller retractor medially and a Hohmann retractor behind the greater trochanter.  We released the lateral joint capsule and used a box cutting osteotome to enter the femoral canal and a rongeur to lateralize.  We then began broaching from a size 8 broach using the Corail broaching system going up to a size 13.  With the size 13 trial implant in place, we trialed a varus offset femoral neck cut and a 36/+1.5 hip ball, reduced this in the acetabulum and I felt like he needed just a little bit more offset and leg length.  We dislocated the hip and removed the trial components.  We were able to then place the real Corail femoral component with varus offset size 13 and the real 36/+5 metal hip ball, reduced this in acetabulum and I was pleased with stability, range of motion, leg length, and offset.  We then irrigated the soft tissue with normal saline solution using pulsatile lavage.  I closed the joint capsule with interrupted #1 Ethibond suture followed by running #1 Vicryl in the tensor fascia, 0 Vicryl in the deep tissue, 2-0 Vicryl in the subcutaneous tissue, and interrupted staples on the skin. Xeroform and Aquacel dressing were applied.  He was taken off the Hana table, awakened, extubated and taken to the recovery room in stable condition.  All final counts were correct.  There were no complications noted.     Lind Guest. Ninfa Linden, M.D.     CYB/MEDQ  D:  01/13/2018  T:  01/13/2018  Job:  [id number]

## 2018-01-15 NOTE — Care Management Note (Signed)
Case Management Note  Patient Details  Name: Austin Dillon MRN: [id number] Date of Birth: 1951-04-06  Subjective/Objective:  67 yr old gentleman s/p ORIF of right hip fracture.                  Action/Plan: Case manager spoke with patient and wife concerning discharge plan and DME. Patient says he will go home at discharge, [facility]ie for [place] agency was offered, patient's wife says they have been active with [facility] and wish to continue with them. They want same therapist, Gustavo Lah. Case manager contacted [facility] with referral. Patient will have family support at discharge.     Expected Discharge Date:   pending               Expected Discharge Plan:  [place]  In-House Referral:  NA  Discharge planning Services  CM Consult  Post Acute Care Choice:  Durable Medical Equipment, Home Health Choice offered to:  Spouse, Patient  DME Arranged:  3-N-1, Walker rolling DME Agency:  [place]:  PT [place]:  [place]  Status of Service:  In process, will continue to follow  If discussed at Long Length of Stay Meetings, dates discussed:    Additional Comments:  Ninfa Meeker, RN 01/15/2018, 3:17 PM

## 2018-01-15 NOTE — Progress Notes (Signed)
Documentation of Face-to-Face Encounter *CMS approved content - may not alter*  Patient Name: Austin Dillon  Date of Birth: [birth year]  I certify that this patient is under my care and that I, or a nurse practitioner or physician's assistant working with me, had a face-to-face encounter that meets the physician face-to-face encounter requirements with this patient on (Insert date that visit occurred): 01/15/18  The encounter with the patient was in whole, or in part, for the following medical condition, which is the primary reason for home health care (List medical condition - see examples at end of note): Post Hip Replacement with limited ambulation and strength  I certify that, based on my findings, the following services are medically necessary home health services (Select all that apply): Nursing and Physical therapy  To provide the following care/treatments: PT eval & treat  My clinical findings support the need for the above services because: weakness post operatively  Further, I certify that my clinical findings support that this patient is homebound (i.e. Absences from home require considerable and taxing effort and are for medical reasons or religious services or infrequently or of short duration when for other reason - see examples at end of note) because: Ambulates short distances less than 300 feet  Elwin Mocha 01/15/2018

## 2018-01-15 NOTE — Progress Notes (Signed)
Physical Therapy Treatment Patient Details Name: Austin Dillon MRN: [id number] DOB: 02/11/1951 Today's Date: 01/15/2018    History of Present Illness Pt is a 67 y/o M PMHx significant for Alzheimer's disease, chronic headaches, depression with anxiety, and open bimalleolar right ankle fracture last spring status post ORIF, Pt presented to the ED with severe right hip pain after a fall at home; found to have displaced right hip femoral neck fracture. Pt is now s/p R anterior THA on 01/13/18    PT Comments    Patient tolerated LE therex well. Continues to c/o more pain in R ankle vs hip. Continue to progress as tolerated.    Follow Up Recommendations  DC plan and follow up therapy as arranged by surgeon     Equipment Recommendations  Rolling walker with 5" wheels    Recommendations for Other Services       Precautions / Restrictions Precautions Precautions: Fall Restrictions Weight Bearing Restrictions: Yes RLE Weight Bearing: Weight bearing as tolerated    Mobility  Bed Mobility                  Transfers                    Ambulation/Gait                 Stairs            Wheelchair Mobility    Modified Rankin (Stroke Patients Only)       Balance Overall balance assessment: Needs assistance Sitting-balance support: Feet supported Sitting balance-Leahy Scale: Good                                      Cognition Arousal/Alertness: Awake/alert Behavior During Therapy: WFL for tasks assessed/performed Overall Cognitive Status: History of cognitive impairments - at baseline                                 General Comments: Pt with hx of alzheimer's       Exercises Total Joint Exercises Ankle Circles/Pumps: AROM;Both;10 reps Quad Sets: AROM;Right;10 reps Towel Squeeze: AROM;Right;10 reps Short Arc Quad: AROM;Right;15 reps Heel Slides: AROM;Right;15 reps Hip ABduction/ADduction:  AROM;Right;Standing;20 reps Straight Leg Raises: AROM;Right;5 reps    General Comments General comments (skin integrity, edema, etc.): pt educated to keep R LE elevated for edema control      Pertinent Vitals/Pain Pain Assessment: Faces Faces Pain Scale: Hurts little more Pain Location: R ankle>hip Pain Descriptors / Indicators: Sore Pain Intervention(s): Limited activity within patient's tolerance;Monitored during session;Repositioned    Home Living                      Prior Function            PT Goals (current goals can now be found in the care plan section) Acute Rehab PT Goals PT Goal Formulation: With patient Time For Goal Achievement: 01/21/18 Potential to Achieve Goals: Good Progress towards PT goals: Progressing toward goals    Frequency    7X/week      PT Plan Current plan remains appropriate    Co-evaluation              AM-PAC PT "6 Clicks" Daily Activity  Outcome Measure  Difficulty turning over in bed (including adjusting bedclothes, sheets and  blankets)?: A Lot Difficulty moving from lying on back to sitting on the side of the bed? : A Lot Difficulty sitting down on and standing up from a chair with arms (e.g., wheelchair, bedside commode, etc,.)?: A Little Help needed moving to and from a bed to chair (including a wheelchair)?: A Little Help needed walking in hospital room?: A Little Help needed climbing 3-5 steps with a railing? : A Lot 6 Click Score: 15    End of Session   Activity Tolerance: Patient tolerated treatment well Patient left: with call bell/phone within reach;in bed Nurse Communication: Mobility status PT Visit Diagnosis: Unsteadiness on feet (R26.81);History of falling (Z91.81);Difficulty in walking, not elsewhere classified (R26.2);Pain Pain - Right/Left: Right Pain - part of body: Ankle and joints of foot;Hip     Time: 1425-1440 PT Time Calculation (min) (ACUTE ONLY): 15 min  Charges:  $Therapeutic  Exercise: 8-22 mins                    G Codes:       Earney Navy, PTA Pager: [phone]     Darliss Cheney 01/15/2018, 2:43 PM

## 2018-01-16 ENCOUNTER — Other Ambulatory Visit: Payer: Self-pay

## 2018-01-16 ENCOUNTER — Ambulatory Visit: Payer: Medicare Other

## 2018-01-16 LAB — CBC
HCT: 26.5 % — ABNORMAL LOW (ref 39.0–52.0)
Hemoglobin: 8.7 g/dL — ABNORMAL LOW (ref 13.0–17.0)
MCH: 33.1 pg (ref 26.0–34.0)
MCHC: 32.8 g/dL (ref 30.0–36.0)
MCV: 100.8 fL — AB (ref 78.0–100.0)
PLATELETS: 187 10*3/uL (ref 150–400)
RBC: 2.63 MIL/uL — AB (ref 4.22–5.81)
RDW: 12.3 % (ref 11.5–15.5)
WBC: 6.9 10*3/uL (ref 4.0–10.5)

## 2018-01-16 LAB — HEMOGLOBIN AND HEMATOCRIT, BLOOD
HCT: 28.3 % — ABNORMAL LOW (ref 39.0–52.0)
HEMOGLOBIN: 9.2 g/dL — AB (ref 13.0–17.0)

## 2018-01-16 LAB — IRON AND TIBC
IRON: 15 ug/dL — AB (ref 45–182)
Saturation Ratios: 10 % — ABNORMAL LOW (ref 17.9–39.5)
TIBC: 153 ug/dL — AB (ref 250–450)
UIBC: 138 ug/dL

## 2018-01-16 LAB — RETICULOCYTES
RBC.: 2.74 MIL/uL — ABNORMAL LOW (ref 4.22–5.81)
RETIC COUNT ABSOLUTE: 52.1 10*3/uL (ref 19.0–186.0)
Retic Ct Pct: 1.9 % (ref 0.4–3.1)

## 2018-01-16 LAB — FERRITIN: FERRITIN: 267 ng/mL (ref 24–336)

## 2018-01-16 LAB — FOLATE: Folate: 10.4 ng/mL (ref >5.9)

## 2018-01-16 LAB — VITAMIN B12: Vitamin B-12: 293 pg/mL (ref 180–914)

## 2018-01-16 MED ORDER — FERROUS SULFATE 325 (65 FE) MG PO TABS: 325.0000 mg | ORAL_TABLET | Freq: Three times a day (TID) | ORAL | 0 refills | Status: DC

## 2018-01-16 MED ORDER — METHOCARBAMOL 500 MG PO TABS: 500.0000 mg | ORAL_TABLET | Freq: Four times a day (QID) | ORAL | 0 refills | Status: DC | PRN

## 2018-01-16 MED ORDER — OXYCODONE HCL 5 MG PO TABS: 5.0000 mg | ORAL_TABLET | ORAL | 0 refills | Status: DC | PRN

## 2018-01-16 MED ORDER — HYDROCODONE-ACETAMINOPHEN 5-325 MG PO TABS: 1.0000 | ORAL_TABLET | Freq: Four times a day (QID) | ORAL | 0 refills | Status: DC | PRN

## 2018-01-16 NOTE — Care Management Important Message (Signed)
Important Message  Patient Details  Name: Austin Dillon MRN: [id number] Date of Birth: 1951-07-28   Medicare Important Message Given:  Yes    Orbie Pyo 01/16/2018, 12:03 PM

## 2018-01-16 NOTE — Progress Notes (Signed)
Physical Therapy Treatment Patient Details Name: Austin Dillon MRN: [id number] DOB: 1951/04/15 Today's Date: 01/16/2018    History of Present Illness Pt is a 67 y/o M PMHx significant for Alzheimer's disease, chronic headaches, depression with anxiety, and open bimalleolar right ankle fracture last spring status post ORIF, Pt presented to the ED with severe right hip pain after a fall at home; found to have displaced right hip femoral neck fracture. Pt is now s/p R anterior THA on 01/13/18    PT Comments    Pt was seen for follow up to AM visit, noting his fatigue and is still scheduled to go home today.  He is willing to do exercises, willing to extend the effort and only declined gait due to fatigue.  Will see in the AM before he discharged home as expected if dc does not happen this afternoon.   Follow Up Recommendations  DC plan and follow up therapy as arranged by surgeon     Equipment Recommendations  Rolling walker with 5" wheels    Recommendations for Other Services       Precautions / Restrictions Precautions Precautions: Fall Restrictions Weight Bearing Restrictions: Yes RLE Weight Bearing: Weight bearing as tolerated    Mobility  Bed Mobility Overal bed mobility: Needs Assistance Bed Mobility: Supine to Sit     Supine to sit: Min guard;HOB elevated     General bed mobility comments: declined  Transfers Overall transfer level: Needs assistance Equipment used: Rolling walker (2 wheeled) Transfers: Sit to/from [facility] Sit to Stand: Min guard Stand pivot transfers: Min assist       General transfer comment: declined  Ambulation/Gait Ambulation/Gait assistance: Min guard Ambulation Distance (Feet): 40 Feet Assistive device: Rolling walker (2 wheeled) Gait Pattern/deviations: Decreased stride length;Wide base of support;Decreased weight shift to right(avoiding WB on RLE) Gait velocity: decreased Gait velocity interpretation: Below  normal speed for age/gender General Gait Details: declined   Stairs Stairs: (family declined, stated she has help coming to get pt in )          Wheelchair Mobility    Modified Rankin (Stroke Patients Only)       Balance Overall balance assessment: Needs assistance Sitting-balance support: Feet supported Sitting balance-Leahy Scale: Good Sitting balance - Comments: sitting EOB   Standing balance support: Bilateral upper extremity supported Standing balance-Leahy Scale: Poor Standing balance comment: walker to control standing                            Cognition Arousal/Alertness: Awake/alert Behavior During Therapy: WFL for tasks assessed/performed Overall Cognitive Status: Within Functional Limits for tasks assessed                                 General Comments: Pt with hx of alzheimer's       Exercises Total Joint Exercises Ankle Circles/Pumps: AROM;Both;5 reps Quad Sets: AROM;Both;10 reps Gluteal Sets: AROM;Both;10 reps Heel Slides: AROM;AAROM;Both;20 reps Hip ABduction/ADduction: AROM;AAROM;Both;20 reps Straight Leg Raises: AROM;AAROM;Both;10 reps Knee Flexion: AROM;Both;10 reps    General Comments General comments (skin integrity, edema, etc.): Pt is requiring hands on assistance to keep pressure off the R heel, not attending to it during ex's      Pertinent Vitals/Pain Pain Assessment: Faces Pain Score: 3  Faces Pain Scale: Hurts little more Pain Location: R ankle>hip Pain Descriptors / Indicators: Sore Pain Intervention(s): Limited activity within  patient's tolerance;Monitored during session;Premedicated before session;Repositioned    Home Living                      Prior Function            PT Goals (current goals can now be found in the care plan section) Acute Rehab PT Goals Patient Stated Goal: get stronger and get home Progress towards PT goals: Progressing toward goals    Frequency     7X/week      PT Plan Current plan remains appropriate    Co-evaluation              AM-PAC PT "6 Clicks" Daily Activity  Outcome Measure  Difficulty turning over in bed (including adjusting bedclothes, sheets and blankets)?: A Little Difficulty moving from lying on back to sitting on the side of the bed? : A Little Difficulty sitting down on and standing up from a chair with arms (e.g., wheelchair, bedside commode, etc,.)?: A Little Help needed moving to and from a bed to chair (including a wheelchair)?: A Little Help needed walking in hospital room?: A Little Help needed climbing 3-5 steps with a railing? : A Lot 6 Click Score: 17    End of Session Equipment Utilized During Treatment: Gait belt Activity Tolerance: Patient tolerated treatment well Patient left: with call bell/phone within reach;in bed;with bed alarm set;with family/visitor present Nurse Communication: Mobility status PT Visit Diagnosis: Unsteadiness on feet (R26.81);History of falling (Z91.81);Difficulty in walking, not elsewhere classified (R26.2);Pain Pain - Right/Left: Right Pain - part of body: Ankle and joints of foot;Hip     Time: 6644-0347 PT Time Calculation (min) (ACUTE ONLY): 29 min  Charges:  $Gait Training: 8-22 mins $Therapeutic Exercise: 23-37 mins                    G Codes:  Functional Assessment Tool Used: AM-PAC 6 Clicks Basic Mobility    Ramond Dial 01/16/2018, 2:43 PM  Mee Hives, PT MS Acute Rehab Dept. Number: [place] and [place]

## 2018-01-16 NOTE — Discharge Instructions (Addendum)
INSTRUCTIONS AFTER JOINT REPLACEMENT  ° °o Remove items at home which could result in a fall. This includes throw rugs or furniture in walking pathways °o ICE to the affected joint every three hours while awake for 30 minutes at a time, for at least the first 3-5 days, and then as needed for pain and swelling.  Continue to use ice for pain and swelling. You may notice swelling that will progress down to the foot and ankle.  This is normal after surgery.  Elevate your leg when you are not up walking on it.   °o Continue to use the breathing machine you got in the hospital (incentive spirometer) which will help keep your temperature down.  It is common for your temperature to cycle up and down following surgery, especially at night when you are not up moving around and exerting yourself.  The breathing machine keeps your lungs expanded and your temperature down. ° ° °DIET:  As you were doing prior to hospitalization, we recommend a well-balanced diet. ° °DRESSING / WOUND CARE / SHOWERING ° °Keep the surgical dressing until follow up.  The dressing is water proof, so you can shower without any extra covering.  IF THE DRESSING FALLS OFF or the wound gets wet inside, change the dressing with sterile gauze.  Please use good hand washing techniques before changing the dressing.  Do not use any lotions or creams on the incision until instructed by your surgeon.   ° °ACTIVITY ° °o Increase activity slowly as tolerated, but follow the weight bearing instructions below.   °o No driving for 6 weeks or until further direction given by your physician.  You cannot drive while taking narcotics.  °o No lifting or carrying greater than 10 lbs. until further directed by your surgeon. °o Avoid periods of inactivity such as sitting longer than an hour when not asleep. This helps prevent blood clots.  °o You may return to work once you are authorized by your doctor.  ° ° ° °WEIGHT BEARING  ° °Weight bearing as tolerated with assist  device (walker, cane, etc) as directed, use it as long as suggested by your surgeon or therapist, typically at least 4-6 weeks. ° ° °EXERCISES ° °Results after joint replacement surgery are often greatly improved when you follow the exercise, range of motion and muscle strengthening exercises prescribed by your doctor. Safety measures are also important to protect the joint from further injury. Any time any of these exercises cause you to have increased pain or swelling, decrease what you are doing until you are comfortable again and then slowly increase them. If you have problems or questions, call your caregiver or physical therapist for advice.  ° °Rehabilitation is important following a joint replacement. After just a few days of immobilization, the muscles of the leg can become weakened and shrink (atrophy).  These exercises are designed to build up the tone and strength of the thigh and leg muscles and to improve motion. Often times heat used for twenty to thirty minutes before working out will loosen up your tissues and help with improving the range of motion but do not use heat for the first two weeks following surgery (sometimes heat can increase post-operative swelling).  ° °These exercises can be done on a training (exercise) mat, on the floor, on a table or on a bed. Use whatever works the best and is most comfortable for you.    Use music or television while you are exercising so that   the exercises are a pleasant break in your day. This will make your life better with the exercises acting as a break in your routine that you can look forward to.   Perform all exercises about fifteen times, three times per day or as directed.  You should exercise both the operative leg and the other leg as well. ° °Exercises include: °  °• Quad Sets - Tighten up the muscle on the front of the thigh (Quad) and hold for 5-10 seconds.   °• Straight Leg Raises - With your knee straight (if you were given a brace, keep it on),  lift the leg to 60 degrees, hold for 3 seconds, and slowly lower the leg.  Perform this exercise against resistance later as your leg gets stronger.  °• Leg Slides: Lying on your back, slowly slide your foot toward your buttocks, bending your knee up off the floor (only go as far as is comfortable). Then slowly slide your foot back down until your leg is flat on the floor again.  °• Angel Wings: Lying on your back spread your legs to the side as far apart as you can without causing discomfort.  °• Hamstring Strength:  Lying on your back, push your heel against the floor with your leg straight by tightening up the muscles of your buttocks.  Repeat, but this time bend your knee to a comfortable angle, and push your heel against the floor.  You may put a pillow under the heel to make it more comfortable if necessary.  ° °A rehabilitation program following joint replacement surgery can speed recovery and prevent re-injury in the future due to weakened muscles. Contact your doctor or a physical therapist for more information on knee rehabilitation.  ° ° °CONSTIPATION ° °Constipation is defined medically as fewer than three stools per week and severe constipation as less than one stool per week.  Even if you have a regular bowel pattern at home, your normal regimen is likely to be disrupted due to multiple reasons following surgery.  Combination of anesthesia, postoperative narcotics, change in appetite and fluid intake all can affect your bowels.  ° °YOU MUST use at least one of the following options; they are listed in order of increasing strength to get the job done.  They are all available over the counter, and you may need to use some, POSSIBLY even all of these options:   ° °Drink plenty of fluids (prune juice may be helpful) and high fiber foods °Colace 100 mg by mouth twice a day  °Senokot for constipation as directed and as needed Dulcolax (bisacodyl), take with full glass of water  °Miralax (polyethylene glycol)  once or twice a day as needed. ° °If you have tried all these things and are unable to have a bowel movement in the first 3-4 days after surgery call either your surgeon or your primary doctor.   ° °If you experience loose stools or diarrhea, hold the medications until you stool forms back up.  If your symptoms do not get better within 1 week or if they get worse, check with your doctor.  If you experience "the worst abdominal pain ever" or develop nausea or vomiting, please contact the office immediately for further recommendations for treatment. ° ° °ITCHING:  If you experience itching with your medications, try taking only a single pain pill, or even half a pain pill at a time.  You can also use Benadryl over the counter for itching or also to   help with sleep.   TED HOSE STOCKINGS:  Use stockings on both legs until for at least 2 weeks or as directed by physician office. They may be removed at night for sleeping.  MEDICATIONS:  See your medication summary on the After Visit Summary that nursing will review with you.  You may have some home medications which will be placed on hold until you complete the course of blood thinner medication.  It is important for you to complete the blood thinner medication as prescribed.  PRECAUTIONS:  If you experience chest pain or shortness of breath - call 911 immediately for transfer to the hospital emergency department.   If you develop a fever greater that 101 F, purulent drainage from wound, increased redness or drainage from wound, foul odor from the wound/dressing, or calf pain - CONTACT YOUR SURGEON.                                                   FOLLOW-UP APPOINTMENTS:  If you do not already have a post-op appointment, please call the office for an appointment to be seen by your surgeon.  Guidelines for how soon to be seen are listed in your After Visit Summary, but are typically between 1-4 weeks after surgery.  OTHER INSTRUCTIONS:   Knee  Replacement:  Do not place pillow under knee, focus on keeping the knee straight while resting. CPM instructions: 0-90 degrees, 2 hours in the morning, 2 hours in the afternoon, and 2 hours in the evening. Place foam block, curve side up under heel at all times except when in CPM or when walking.  DO NOT modify, tear, cut, or change the foam block in any way.  MAKE SURE YOU:   Understand these instructions.   Get help right away if you are not doing well or get worse.    Thank you for letting us be a part of your medical care team.  It is a privilege we respect greatly.  We hope these instructions will help you stay on track for a fast and full recovery!    YOU MAY PUT FULL WEIGHT ON YOUR RIGHT ANKLE.    Additional discharge instructions:  Please get your medications reviewed and adjusted by your Primary MD.  Please request your Primary MD to go over all Hospital Tests and Procedure/Radiological results at the follow up, please get all Hospital records sent to your Prim MD by signing hospital release before you go home.  If you had Pneumonia of Lung problems at the Hospital: Please get a 2 view Chest X ray done in 6-8 weeks after hospital discharge or sooner if instructed by your Primary MD.  If you have Congestive Heart Failure: Please call your Cardiologist or Primary MD anytime you have any of the following symptoms:  1) 3 pound weight gain in 24 hours or 5 pounds in 1 week  2) shortness of breath, with or without a dry hacking cough  3) swelling in the hands, feet or stomach  4) if you have to sleep on extra pillows at night in order to breathe  Follow cardiac low salt diet and 1.5 lit/day fluid restriction.  If you have diabetes Accuchecks 4 times/day, Once in AM empty stomach and then before each meal. Log in all results and show them to your primary doctor at your next  visit. If any glucose reading is under 80 or above 300 call your primary MD immediately.  If you have  Seizure/Convulsions/Epilepsy: Please do not drive, operate heavy machinery, participate in activities at heights or participate in high speed sports until you have seen by Primary MD or a Neurologist and advised to do so again.  If you had Gastrointestinal Bleeding: Please ask your Primary MD to check a complete blood count within one week of discharge or at your next visit. Your endoscopic/colonoscopic biopsies that are pending at the time of discharge, will also need to followed by your Primary MD.  Get Medicines reviewed and adjusted. Please take all your medications with you for your next visit with your Primary MD  Please request your Primary MD to go over all hospital tests and procedure/radiological results at the follow up, please ask your Primary MD to get all Hospital records sent to his/her office.  If you experience worsening of your admission symptoms, develop shortness of breath, life threatening emergency, suicidal or homicidal thoughts you must seek medical attention immediately by calling 911 or calling your MD immediately  if symptoms less severe.  You must read complete instructions/literature along with all the possible adverse reactions/side effects for all the Medicines you take and that have been prescribed to you. Take any new Medicines after you have completely understood and accpet all the possible adverse reactions/side effects.   Do not drive or operate heavy machinery when taking Pain medications.   Do not take more than prescribed Pain, Sleep and Anxiety Medications  Special Instructions: If you have smoked or chewed Tobacco  in the last 2 yrs please stop smoking, stop any regular Alcohol  and or any Recreational drug use.  Wear Seat belts while driving.  Please note You were cared for by a hospitalist during your hospital stay. If you have any questions about your discharge medications or the care you received while you were in the hospital after you are  discharged, you can call the unit and asked to speak with the hospitalist on call if the hospitalist that took care of you is not available. Once you are discharged, your primary care physician will handle any further medical issues. Please note that NO REFILLS for any discharge medications will be authorized once you are discharged, as it is imperative that you return to your primary care physician (or establish a relationship with a primary care physician if you do not have one) for your aftercare needs so that they can reassess your need for medications and monitor your lab values.  You can reach the hospitalist office at phone [phone] or fax [phone]   If you do not have a primary care physician, you can call [phone] for a physician referral.

## 2018-01-16 NOTE — Progress Notes (Signed)
[facility] PROGRESS NOTE  ISEAH PLOUFF QAS:[id number] DOB: Apr 23, 1951 DOA: 01/12/2018 PCP: Baxter Hire, MD  Assessment/Plan:  Fever, resolved - Lactic acid x2 normal -Blood cultures drawn x2 (2/10), NGTD -Cont  on empiric vancomycin and Zosyn -Chest x-ray negative - UA neg - flu neg Patient ready for discharge today but unable to get a ride will leave in the morning  Anemia  15.1 admit ---> 9.3 hgb today Did recheck that showed level stable patient remains asymptomatic Will check FOBT UA neg yesterday Anemia labs will likely need outpatient iron infusion  Right hip fracture , postop day 3 - Presents with severe right hip pain after a fall at home the night of 01/11/18  - Orthopedic surgery is consulting and much appreciated  - As needed oxycodone, norco and Robaxin for pain control   Depression with anxiety  - Stable  - Continue Lexapro, Cymbalta, and prn Xanax    Open bimalleolar ankle fracture, right - Status-post ORIF  - Delayed wound healing, does not appear acutely infected on admission  - Continue supportive care and ortho follow-up    DVT prophylaxis: SCD's  Code Status: Full  Family Communication: none available Disposition Plan: Admit to med-surg Consults called: Orthopedic surgery Admission status: Inpatient      Consultants:  Orthopedics  Procedures:  Right anterior hip replacement February 9  Antibiotics:  Vanc & Zosyn  HPI/Subjective: Patient states he feels well. Working with PT and able to keep his balance.  Objective: Vitals:   01/16/18 0531 01/16/18 1300  BP: (!) 115/57 (!) 116/58  Pulse: 78 77  Resp: 19 18  Temp: 98.5 F (36.9 C) 98.8 F (37.1 C)  SpO2: 96% 99%    Intake/Output Summary (Last 24 hours) at 01/16/2018 1857 Last data filed at 01/16/2018 1700 Gross per 24 hour  Intake 720 ml  Output 800 ml  Net -80 ml   Filed Weights   01/13/18 1040  Weight: 81.6 kg (180 lb)    Exam:   General:  NCAT,  NAD, alert &Ox3  Cardiovascular: RRR, no MRG  Respiratory: CTAB, nl wob  Abdomen: NS+, ND  Musculoskeletal: able to move feet, dressing on R hip CDI   Data Reviewed: Basic Metabolic Panel: Recent Labs  Lab 01/12/18 1614 01/13/18 0601 01/14/18 0936 01/15/18 0617  NA 134* 137 138 139  K 4.1 4.2 5.0 4.4  CL 99* 100* 102 99*  CO2 26 27 28 28   GLUCOSE 126* 105* 135* 119*  BUN 7 7 5* 6  CREATININE 0.89 0.97 0.97 1.03  CALCIUM 9.4 8.9 8.6* 8.7*   Liver Function Tests: No results for input(s): AST, ALT, ALKPHOS, BILITOT, PROT, ALBUMIN in the last 168 hours. No results for input(s): LIPASE, AMYLASE in the last 168 hours. No results for input(s): AMMONIA in the last 168 hours. CBC: Recent Labs  Lab 01/12/18 1614 01/13/18 0601 01/14/18 0936 01/15/18 0617 01/16/18 0641 01/16/18 1402  WBC 13.2* 10.5 8.6 8.4 6.9  --   NEUTROABS 11.8*  --  5.7  --   --   --   HGB 15.1 14.0 10.5* 9.3* 8.7* 9.2*  HCT 43.4 41.9 33.1* 28.6* 26.5* 28.3*  MCV 97.4 98.8 102.2* 101.1* 100.8*  --   PLT 220 188 168 159 187  --    Cardiac Enzymes: No results for input(s): CKTOTAL, CKMB, CKMBINDEX, TROPONINI in the last 168 hours. BNP (last 3 results) No results for input(s): BNP in the last 8760 hours.  ProBNP (last 3 results)  No results for input(s): PROBNP in the last 8760 hours.  CBG: No results for input(s): GLUCAP in the last 168 hours.  Recent Results (from the past 240 hour(s))  Surgical pcr screen     Status: None   Collection Time: 01/13/18 12:11 AM  Result Value Ref Range Status   MRSA, PCR NEGATIVE NEGATIVE Final   Staphylococcus aureus NEGATIVE NEGATIVE Final    Comment: (NOTE) The Xpert SA Assay (FDA approved for NASAL specimens in patients 27 years of age and older), is one component of a comprehensive surveillance program. It is not intended to diagnose infection nor to guide or monitor treatment. Performed at [facility], [place] [address] [place], [place] [address]    Culture, blood (Routine X 2) w Reflex to ID Panel     Status: None (Preliminary result)   Collection Time: 01/14/18  9:36 AM  Result Value Ref Range Status   Specimen Description BLOOD RIGHT HAND  Final   Special Requests   Final    BOTTLES DRAWN AEROBIC ONLY Blood Culture results may not be optimal due to an excessive volume of blood received in culture bottles   Culture   Final    NO GROWTH 2 DAYS Performed at [facility], [place] [address] [place], [place] [address]    Report Status PENDING  Incomplete  Culture, blood (Routine X 2) w Reflex to ID Panel     Status: None (Preliminary result)   Collection Time: 01/14/18  9:41 AM  Result Value Ref Range Status   Specimen Description BLOOD RIGHT ANTECUBITAL  Final   Special Requests   Final    BOTTLES DRAWN AEROBIC ONLY Blood Culture results may not be optimal due to an excessive volume of blood received in culture bottles   Culture   Final    NO GROWTH 2 DAYS Performed at [facility], [place] [address] [place], [place] [address]    Report Status PENDING  Incomplete     Studies: No results found.  Scheduled Meds: . aspirin EC  325 mg Oral BID PC  . cholecalciferol  1,000 Units Oral QHS  . DULoxetine  20 mg Oral Daily  . feeding supplement (ENSURE ENLIVE)  237 mL Oral Q1500  . lamoTRIgine  100 mg Oral Daily  . lamoTRIgine  150 mg Oral QHS  . memantine  10 mg Oral BID  . mupirocin ointment  1 application Topical BID   Continuous Infusions: . sodium chloride 10 mL/hr at 01/14/18 1052  . lactated ringers 10 mL/hr at 01/13/18 0848  . piperacillin-tazobactam (ZOSYN)  IV 3.375 g (01/16/18 1823)  . vancomycin Stopped (01/16/18 1130)    Principal Problem:   Closed right hip fracture, initial encounter [facility]) Active Problems:   Open fracture ankle, bimalleolar, right, type III, with delayed healing, subsequent encounter   Depression with anxiety    Time spent: [place]. If 7PM-7AM, please contact night-coverage at [email or web address], password [facility] 01/16/2018, 6:57 PM  LOS: 4 days

## 2018-01-16 NOTE — Progress Notes (Addendum)
Physical Therapy Treatment Patient Details Name: Austin Dillon MRN: [id number] DOB: 1951-03-27 Today's Date: 01/16/2018    History of Present Illness Pt is a 67 y/o M PMHx significant for Alzheimer's disease, chronic headaches, depression with anxiety, and open bimalleolar right ankle fracture last spring status post ORIF, Pt presented to the ED with severe right hip pain after a fall at home; found to have displaced right hip femoral neck fracture. Pt is now s/p R anterior THA on 01/13/18    PT Comments    Pt was seen for AM visit with plan for stairs but wife declined to get pt on stairs.  He has help arranged to get up the stairs at home and so did not perform.  Pt was NWB on RLE with occas TDWB due to inability to maintain NWB, per wife explaining that he is not permitted by podiatrist to stand on R foot.  Limited treatment due to pt fatigue and limits of WB, and did walk a short trip with exercises to BLE's.  Follow acutely until dc home which is concerning given that pt did not demonstrate an ability to walk the stairs.     Follow Up Recommendations  DC plan and follow up therapy as arranged by surgeon     Equipment Recommendations  Rolling walker with 5" wheels    Recommendations for Other Services       Precautions / Restrictions Precautions Precautions: Fall Restrictions Weight Bearing Restrictions: Yes RLE Weight Bearing: Weight bearing as tolerated(but NWB on heel wound per wife)    Mobility  Bed Mobility Overal bed mobility: Needs Assistance Bed Mobility: Supine to Sit     Supine to sit: Min guard;HOB elevated     General bed mobility comments: safety to transition with surgery R hip  Transfers Overall transfer level: Needs assistance Equipment used: Rolling walker (2 wheeled) Transfers: Sit to/from [facility] Sit to Stand: Min guard Stand pivot transfers: Min assist       General transfer comment: pt is mainly maintaining NWB and occas  TDWB  Ambulation/Gait Ambulation/Gait assistance: Min guard Ambulation Distance (Feet): 40 Feet Assistive device: Rolling walker (2 wheeled) Gait Pattern/deviations: Decreased stride length;Wide base of support;Decreased weight shift to right(avoiding WB on RLE) Gait velocity: decreased Gait velocity interpretation: Below normal speed for age/gender General Gait Details: cues for posture and sequencing; pt maintained NWB R LE with TDWB at times   Stairs Stairs: (family declined, stated she has help coming to get pt in )          Wheelchair Mobility    Modified Rankin (Stroke Patients Only)       Balance Overall balance assessment: Needs assistance Sitting-balance support: Feet supported Sitting balance-Leahy Scale: Good     Standing balance support: Bilateral upper extremity supported;During functional activity Standing balance-Leahy Scale: Poor Standing balance comment: requires walker to balance with NWB on R and tends to be uneven with his arm wgt distribution                            Cognition Arousal/Alertness: Awake/alert Behavior During Therapy: WFL for tasks assessed/performed Overall Cognitive Status: History of cognitive impairments - at baseline                                 General Comments: Pt with hx of alzheimer's       Exercises  Total Joint Exercises Ankle Circles/Pumps: AROM;Both;5 reps(holding stretches) Quad Sets: AROM;Both;10 reps Gluteal Sets: AROM;Both;10 reps Hip ABduction/ADduction: AROM;Both;10 reps    General Comments        Pertinent Vitals/Pain Pain Assessment: 0-10 Pain Score: 3  Faces Pain Scale: Hurts little more Pain Location: R ankle>hip Pain Descriptors / Indicators: Sore Pain Intervention(s): Limited activity within patient's tolerance;Monitored during session;Premedicated before session;Repositioned    Home Living                      Prior Function            PT Goals  (current goals can now be found in the care plan section) Acute Rehab PT Goals Patient Stated Goal: get stronger and get home Progress towards PT goals: Progressing toward goals    Frequency    7X/week      PT Plan Current plan remains appropriate    Co-evaluation              AM-PAC PT "6 Clicks" Daily Activity  Outcome Measure  Difficulty turning over in bed (including adjusting bedclothes, sheets and blankets)?: A Little Difficulty moving from lying on back to sitting on the side of the bed? : A Little Difficulty sitting down on and standing up from a chair with arms (e.g., wheelchair, bedside commode, etc,.)?: A Little Help needed moving to and from a bed to chair (including a wheelchair)?: A Little Help needed walking in hospital room?: A Little Help needed climbing 3-5 steps with a railing? : A Lot 6 Click Score: 17    End of Session Equipment Utilized During Treatment: Gait belt Activity Tolerance: Patient tolerated treatment well Patient left: in chair;with call bell/phone within reach;with chair alarm set;with family/visitor present Nurse Communication: Mobility status PT Visit Diagnosis: Unsteadiness on feet (R26.81);History of falling (Z91.81);Difficulty in walking, not elsewhere classified (R26.2);Pain Pain - Right/Left: Right Pain - part of body: Ankle and joints of foot;Hip     Time: 2094-7096 PT Time Calculation (min) (ACUTE ONLY): 27 min  Charges:  $Gait Training: 8-22 mins $Therapeutic Exercise: 8-22 mins                    G Codes:  Functional Assessment Tool Used: AM-PAC 6 Clicks Basic Mobility    Ramond Dial 01/16/2018, 2:36 PM   Mee Hives, PT MS Acute Rehab Dept. Number: [place] and [place]

## 2018-01-16 NOTE — Progress Notes (Signed)
Patient ID: Austin Dillon, male   DOB: [birth year], 67 y.o.   MRN: [id number] No acute changes.  Right hip stable.  He can put full weight as tolerated on his right hip and ankle.  His hip dressing is changed today.

## 2018-01-17 DIAGNOSIS — Z96641 Presence of right artificial hip joint: Secondary | ICD-10-CM

## 2018-01-17 DIAGNOSIS — F418 Other specified anxiety disorders: Secondary | ICD-10-CM

## 2018-01-17 DIAGNOSIS — D62 Acute posthemorrhagic anemia: Secondary | ICD-10-CM

## 2018-01-17 DIAGNOSIS — R509 Fever, unspecified: Secondary | ICD-10-CM

## 2018-01-17 MED ORDER — ENSURE ENLIVE PO LIQD: 237.0000 mL | Freq: Every day | ORAL | 12 refills | Status: DC

## 2018-01-17 MED ORDER — ASPIRIN EC 81 MG PO TBEC: 81.0000 mg | DELAYED_RELEASE_TABLET | Freq: Two times a day (BID) | ORAL | 0 refills | Status: DC

## 2018-01-17 MED ORDER — SENNOSIDES-DOCUSATE SODIUM 8.6-50 MG PO TABS: 1.0000 | ORAL_TABLET | Freq: Every evening | ORAL | 0 refills | Status: DC | PRN

## 2018-01-17 NOTE — Discharge Summary (Signed)
Physician Discharge Summary  Austin Dillon SWN:[id number] DOB: 11/03/1951  PCP: Austin Hire, MD  Admit date: 01/12/2018 Discharge date: 01/17/2018  Recommendations for Outpatient Follow-up:  1. Dr. Harrel Dillon, PCP in 1 week with repeat labs (CBC & BMP).  Please follow final blood culture results that were sent from the hospital. 2. Dr. Jean Dillon, Orthopedics in 2 weeks. 3. Dr. Meridee Dillon, Orthopedics: Patient has prior appointment on 01/23/18 at 2:15 PM.  Home Health: PT. Equipment/Devices: Rolling walker with 5" wheels, 3-N-1.    Discharge Condition: Improved and stable CODE STATUS: Full Diet recommendation: Heart healthy diet.  Discharge Diagnoses:  Principal Problem:   Closed right hip fracture, initial encounter [facility]) Active Problems:   Open fracture ankle, bimalleolar, right, type III, with delayed healing, subsequent encounter   Depression with anxiety   Brief Summary: Austin Dillon is a 67 y.o. male with medical history significant for Alzheimer's disease, chronic headaches, depression with anxiety, and open bimalleolar right ankle fracture last spring status post ORIF, now presented to the emergency department for evaluation of severe right hip pain after a fall at home.  Patient has delayed wound healing at the right ankle, continues to follow with orthopedic surgery/Dr. Sharol Dillon, and continues to ambulate with crutches.  He reported that night prior to admission, the rubber foot came off of 1 of his crutches causing him to fall onto his right side.  He experienced immediate and severe pain at the right hip and required assistance from his wife to make it over to the couch where he slept and then spent the day.   ED Course: Upon arrival to the ED, patient is found to be afebrile, saturating well on room air, and with pulse otherwise normal.  Chest x-rays negative for acute cardiopulmonary disease, chemistry panel reveals mild hyponatremia, CBC is notable for a  leukocytosis to 13,200, and radiographs of the right hip demonstrate a displaced fracture of the right femoral neck.  Orthopedic surgery was consulted by the ED physician and requested a medical admission to [facility]  Patient was provided analgesia, remained hemodynamically stable and in no apparent respiratory distress, and was admitted to the medical-surgical unit for ongoing evaluation and management of right hip fracture.  He underwent right anterior total hip replacement.  Orthopedics has cleared him for discharge home with home health services.   Assessment and plan:  1. Displaced right hip femoral neck fracture: Sustained status post mechanical fall.  Orthopedics was consulted and patient underwent right total hip arthroplasty through direct anterior approach on 01/13/18.  As per orthopedics, weightbearing as tolerated on right hip, no precautions.  Limited by his chronic right ankle issues-can still weight-bear as tolerated on this.  I discussed with Dr. Ninfa Dillon who recommended aspirin 81 mg twice daily for 30 days for postop DVT prophylaxis.  Right postop hip dressing was changed 2/12 and can stay until outpatient follow-up with orthopedics in 2 weeks. 2. Open bimalleolar ankle fracture, right: Status post ORIF.  History of delayed wound healing.  Did not appear acutely infected on admission.  As per orthopedics, weightbearing on right lower extremity as tolerated.  Has outpatient follow-up with Dr. Sharol Dillon. 3. Depression with anxiety: Stable without suicidal or homicidal ideations.  Continue prior home medications as below. 4. Postop acute blood loss anemia: Hemoglobin on admission was 15.1.  This dropped to 9.3 on 2/11 but has been stable in the low 9 g per DL range for the last 3 days.  Continue  iron supplements at discharge.  Follow CBCs in a few days as outpatient. 5. Fever: Patient spiked a fever of 101.1 on 01/13/11 night.  Extensive workup (MRSA PCR, blood cultures x2, urine  microscopy, chest x-ray, flu panel PCR) were unremarkable.  Patient was briefly placed on empiric IV vancomycin and Zosyn which were then discontinued.  No further fevers.  No leukocytosis.  Low index of clinical suspicion for infectious cause.  Fever was likely due to recent postop status and interop manipulation.     Consultations:  Orthopedics  Procedures:  As above   Discharge Instructions  Discharge Instructions    Call MD for:  difficulty breathing, headache or visual disturbances   Complete by:  As directed    Call MD for:  extreme fatigue   Complete by:  As directed    Call MD for:  persistant dizziness or light-headedness   Complete by:  As directed    Call MD for:  redness, tenderness, or signs of infection (pain, swelling, redness, odor or green/yellow discharge around incision site)   Complete by:  As directed    Call MD for:  severe uncontrolled pain   Complete by:  As directed    Call MD for:  temperature >100.4   Complete by:  As directed    Diet - low sodium heart healthy   Complete by:  As directed    Discharge instructions   Complete by:  As directed    PCP follow-up in 1 weeks about pain control Also discuss anemia and possible iron infusion And restarting aspirin  Call Dr. Trevor Dillon office for one week follow-up concerning your hip   Discharge instructions   Complete by:  As directed    Acetaminophen/Tylenol dose from all sources not to exceed more than 4 g/day.   Increase activity slowly   Complete by:  As directed        Medication List    STOP taking these medications   acetaminophen 500 MG tablet Commonly known as:  TYLENOL   celecoxib 200 MG capsule Commonly known as:  CELEBREX     TAKE these medications   alprazolam 2 MG tablet Commonly known as:  XANAX Take 2 mg by mouth 3 (three) times daily.   aspirin EC 81 MG tablet Take 1 tablet (81 mg total) by mouth 2 (two) times daily.   butalbital-acetaminophen-caffeine 50-325-40 MG  tablet Commonly known as:  FIORICET, ESGIC Take 1 tablet by mouth daily as needed for headache.   DULoxetine 20 MG capsule Commonly known as:  CYMBALTA Take 20 mg by mouth daily.   feeding supplement (ENSURE ENLIVE) Liqd Take 237 mLs by mouth daily at 3 pm.   ferrous sulfate 325 (65 FE) MG tablet Take 1 tablet (325 mg total) by mouth 3 (three) times daily with meals for 20 days.   lamoTRIgine 100 MG tablet Commonly known as:  LAMICTAL Take 100-150 mg by mouth See admin instructions. 100 mg by mouth in the morning and 150 mg in the evening   memantine 10 MG tablet Commonly known as:  NAMENDA Take 10 mg by mouth 2 (two) times daily.   methocarbamol 500 MG tablet Commonly known as:  ROBAXIN Take 1 tablet (500 mg total) by mouth every 6 (six) hours as needed for muscle spasms.   oxyCODONE-acetaminophen 5-325 MG tablet Commonly known as:  ROXICET Take 1-2 tablets by mouth every 6 (six) hours as needed for severe pain. What changed:    how much to take  when to take this   senna-docusate 8.6-50 MG tablet Commonly known as:  Senokot-S Take 1 tablet by mouth at bedtime as needed for mild constipation or moderate constipation.   tamsulosin 0.4 MG Caps capsule Commonly known as:  FLOMAX Take 0.4 mg by mouth at bedtime.   Vitamin D-3 1000 units Caps Take 1,000 Units by mouth daily.      Follow-up Information    Mcarthur Rossetti, MD. Schedule an appointment as soon as possible for a visit in 2 week(s).   Specialty:  Orthopedic Surgery Contact information: [place] [address] [phone]        [facility] [place] Follow up.   Why:  A representative from [facility] will contact you to arrange start date and time for your therapy. Contact information: [place] [address] [phone]        Austin Hire, MD. Schedule an appointment as soon as possible for a visit in 1 week(s).    Specialty:  Internal Medicine Why:  To be seen with repeat labs (CBC & BMP). Contact information: [place] [address] [phone]          Allergies  Allergen Reactions  . Nortriptyline Other (See Comments)    Severe mood swings  . Aricept [Donepezil] Other (See Comments)    Makes the patient look like a "mummy" (too sedating)      Procedures/Studies: Dg Chest 1 View  Result Date: 01/12/2018 CLINICAL DATA:  Fall, right hip pain. EXAM: CHEST 1 VIEW COMPARISON:  None. FINDINGS: Heart size and mediastinal contours are within normal limits. Lungs are clear. No pleural effusion or pneumothorax seen. Osseous structures about the chest are unremarkable. IMPRESSION: No active disease. Electronically Signed   By: Franki Cabot M.D.   On: 01/12/2018 16:56   Pelvis Portable  Result Date: 01/13/2018 CLINICAL DATA:  Postop right total hip replacement EXAM: PORTABLE PELVIS 1-2 VIEWS COMPARISON:  01/12/2018 FINDINGS: Changes of right hip replacement. Normal AP alignment. No hardware or bony complicating feature. IMPRESSION: Right hip replacement.  No complicating feature. Electronically Signed   By: Rolm Baptise M.D.   On: 01/13/2018 12:23   Dg Chest Port 1 View  Result Date: 01/14/2018 CLINICAL DATA:  New onset of fever. EXAM: PORTABLE CHEST 1 VIEW COMPARISON:  01/12/2018 chest radiograph FINDINGS: The cardiomediastinal silhouette is unremarkable. There is no evidence of focal airspace disease, pulmonary edema, suspicious pulmonary nodule/mass, pleural effusion, or pneumothorax. No acute bony abnormalities are identified. IMPRESSION: No active disease. Electronically Signed   By: Margarette Canada M.D.   On: 01/14/2018 09:20   Dg C-arm 1-60 Min  Result Date: 01/13/2018 CLINICAL DATA:  Right hip replacement EXAM: DG C-ARM 61-120 MIN; OPERATIVE RIGHT HIP WITH PELVIS COMPARISON:  01/12/2018 FINDINGS: Changes of right hip replacement. Normal AP alignment. No hardware or bony  complicating feature. IMPRESSION: Right hip replacement.  No complicating feature. Electronically Signed   By: Rolm Baptise M.D.   On: 01/13/2018 12:22   Dg Hip Operative Unilat With Pelvis Right  Result Date: 01/13/2018 CLINICAL DATA:  Right hip replacement EXAM: DG C-ARM 61-120 MIN; OPERATIVE RIGHT HIP WITH PELVIS COMPARISON:  01/12/2018 FINDINGS: Changes of right hip replacement. Normal AP alignment. No hardware or bony complicating feature. IMPRESSION: Right hip replacement.  No complicating feature. Electronically Signed   By: Rolm Baptise M.D.   On: 01/13/2018 12:22   Dg Hip Unilat W Or Wo Pelvis 2-3  Views Right  Result Date: 01/12/2018 CLINICAL DATA:  Fall last night, right hip pain. EXAM: DG HIP (WITH OR WITHOUT PELVIS) 2-3V RIGHT COMPARISON:  None. FINDINGS: Displaced fracture within the subcapital portion of the right femoral neck, with nearly 90 degrees angulation deformity at the fracture site. Right femoral head remains grossly well positioned relative to the acetabulum. No additional fracture identified within the osseous pelvis or about the left hip. IMPRESSION: Displaced fracture of the right femoral neck, subcapital region, with nearly 90 degrees angulation deformity at the fracture site. Electronically Signed   By: Franki Cabot M.D.   On: 01/12/2018 16:58   Xr Ankle Complete Right    Subjective: Patient reports appropriate right hip postop pain.  Mild chronic right ankle pain and swelling since recent surgery.  Also reports chronic intermittent headaches.  No fevers, cough, dyspnea, chest pain, sore throat, earache, nausea, vomiting, abdominal pain, dysuria or urinary frequency.  Reports couple of BMs after drinking prune juice. Spouse at bedside states that they were not discharged early enough yesterday and could not return home due to her visual impairment and inability to drive after desk/dark.  Discharge Exam:  Vitals:   01/16/18 1300 01/16/18 1900 01/17/18 0409 01/17/18  0849  BP: (!) 116/58 (!) 116/53 120/71   Pulse: 77 77 89   Resp: 18 18 17    Temp: 98.8 F (37.1 C) 99.2 F (37.3 C) 99.8 F (37.7 C) 98.8 F (37.1 C)  TempSrc: Oral Oral Oral   SpO2: 99% 95% 96%   Weight:      Height:        General: Pleasant middle-aged male, moderately built and nourished, sitting up comfortably in bed. Cardiovascular: S1 & S2 heard, RRR, S1/S2 +. No murmurs, rubs, gallops or clicks. No JVD or pedal edema. Respiratory: Clear to auscultation without wheezing, rhonchi or crackles. No increased work of breathing. Abdominal:  Non distended, non tender & soft. No organomegaly or masses appreciated. Normal bowel sounds heard. CNS: Alert and oriented. No focal deficits. Extremities: no edema, no cyanosis.  Right hip postop dressing clean and dry.  Trace right ankle edema and ankle swelling without acute findings.  Mildly painful range of movements in right ankle and operated right hip.  Otherwise normal symmetric appearing power.    The results of significant diagnostics from this hospitalization (including imaging, microbiology, ancillary and laboratory) are listed below for reference.     Microbiology: Recent Results (from the past 240 hour(s))  Surgical pcr screen     Status: None   Collection Time: 01/13/18 12:11 AM  Result Value Ref Range Status   MRSA, PCR NEGATIVE NEGATIVE Final   Staphylococcus aureus NEGATIVE NEGATIVE Final    Comment: (NOTE) The Xpert SA Assay (FDA approved for NASAL specimens in patients 44 years of age and older), is one component of a comprehensive surveillance program. It is not intended to diagnose infection nor to guide or monitor treatment. Performed at [facility], [place] [address], [place], [place] [address]   Culture, blood (Routine X 2) w Reflex to ID Panel     Status: None (Preliminary result)   Collection Time: 01/14/18  9:36 AM  Result Value Ref Range Status   Specimen Description BLOOD RIGHT HAND  Final   Special  Requests   Final    BOTTLES DRAWN AEROBIC ONLY Blood Culture results may not be optimal due to an excessive volume of blood received in culture bottles   Culture   Final  NO GROWTH 2 DAYS Performed at [facility], [place] [address] [place], [place] [address]    Report Status PENDING  Incomplete  Culture, blood (Routine X 2) w Reflex to ID Panel     Status: None (Preliminary result)   Collection Time: 01/14/18  9:41 AM  Result Value Ref Range Status   Specimen Description BLOOD RIGHT ANTECUBITAL  Final   Special Requests   Final    BOTTLES DRAWN AEROBIC ONLY Blood Culture results may not be optimal due to an excessive volume of blood received in culture bottles   Culture   Final    NO GROWTH 2 DAYS Performed at [facility], [place] [address] [place], [place] [address]    Report Status PENDING  Incomplete     Labs: CBC: Recent Labs  Lab 01/12/18 1614 01/13/18 0601 01/14/18 0936 01/15/18 0617 01/16/18 0641 01/16/18 1402  WBC 13.2* 10.5 8.6 8.4 6.9  --   NEUTROABS 11.8*  --  5.7  --   --   --   HGB 15.1 14.0 10.5* 9.3* 8.7* 9.2*  HCT 43.4 41.9 33.1* 28.6* 26.5* 28.3*  MCV 97.4 98.8 102.2* 101.1* 100.8*  --   PLT 220 188 168 159 187  --    Basic Metabolic Panel: Recent Labs  Lab 01/12/18 1614 01/13/18 0601 01/14/18 0936 01/15/18 0617  NA 134* 137 138 139  K 4.1 4.2 5.0 4.4  CL 99* 100* 102 99*  CO2 26 27 28 28   GLUCOSE 126* 105* 135* 119*  BUN 7 7 5* 6  CREATININE 0.89 0.97 0.97 1.03  CALCIUM 9.4 8.9 8.6* 8.7*   Anemia work up Recent Labs    01/16/18 0940  VITAMINB12 293  FOLATE 10.4  FERRITIN 267  TIBC 153*  IRON 15*  RETICCTPCT 1.9   Urinalysis    Component Value Date/Time   COLORURINE YELLOW 01/14/2018 [place] 01/14/2018 0834   LABSPEC 1.025 01/14/2018 0834   PHURINE 5.0 01/14/2018 [place] 01/14/2018 0834   [place] NEGATIVE 01/14/2018 0834   [place] NEGATIVE 01/14/2018 0834   [place]  01/14/2018 0834   [place] NEGATIVE 01/14/2018 0834   [place] NEGATIVE 01/14/2018 0834   LEUKOCYTESUR NEGATIVE 01/14/2018 0834    Discussed in detail with patient and his spouse at bedside.  Updated care and answered questions.  Discussed with case management.  Time coordinating discharge: Over 30 minutes  SIGNED:  Vernell Leep, MD, FACP, [facility]. [facility] Pager [phone]  If 7PM-7AM, please contact night-coverage [email or web address] Password [facility] 01/17/2018, 12:23 PM

## 2018-01-19 LAB — CULTURE, BLOOD (ROUTINE X 2)
Culture: NO GROWTH
Culture: NO GROWTH

## 2018-01-23 ENCOUNTER — Ambulatory Visit (INDEPENDENT_AMBULATORY_CARE_PROVIDER_SITE_OTHER): Payer: Medicare Other | Admitting: Orthopedic Surgery

## 2018-01-31 ENCOUNTER — Encounter (INDEPENDENT_AMBULATORY_CARE_PROVIDER_SITE_OTHER): Payer: Self-pay | Admitting: Orthopaedic Surgery

## 2018-01-31 ENCOUNTER — Ambulatory Visit (INDEPENDENT_AMBULATORY_CARE_PROVIDER_SITE_OTHER): Payer: Medicare Other | Admitting: Orthopaedic Surgery

## 2018-01-31 DIAGNOSIS — Z96641 Presence of right artificial hip joint: Secondary | ICD-10-CM

## 2018-01-31 MED ORDER — OXYCODONE-ACETAMINOPHEN 5-325 MG PO TABS: 1.0000 | ORAL_TABLET | Freq: Four times a day (QID) | ORAL | 0 refills | Status: DC | PRN

## 2018-01-31 NOTE — Progress Notes (Signed)
The patient is 2 weeks status post a right anterior hip replacement to treat a displaced right hip femoral neck fracture.  He is only 67 years old.  He has been dealing with recovery from a severe right ankle fracture.  He actually sees my partner Dr. Sharol Given for this.  He has an appoint with Dr. Sharol Given next week.  His crutches fell out from under him 2 weeks ago when he sustained a displaced right hip femoral neck fracture so he placed a right anterior hip.  Is been ambulating now with a walker and doing well overall.  On examination his right hip incision looks good to remove the staples and placed Steri-Strips.  His leg lengths near equal.  He is just slightly longer on his right operative side.  His right ankle is painful but as to be expected from his previous fracture recovery.  At this point will continue increase his activities as comfort allows.  He can get his Steri-Strips wet and shower.  I will see him back in a month to see how he is doing overall.

## 2018-02-06 ENCOUNTER — Encounter (INDEPENDENT_AMBULATORY_CARE_PROVIDER_SITE_OTHER): Payer: Self-pay | Admitting: Orthopedic Surgery

## 2018-02-06 ENCOUNTER — Ambulatory Visit (INDEPENDENT_AMBULATORY_CARE_PROVIDER_SITE_OTHER): Payer: Medicare Other | Admitting: Orthopedic Surgery

## 2018-02-06 DIAGNOSIS — M79671 Pain in right foot: Secondary | ICD-10-CM

## 2018-02-06 DIAGNOSIS — S82841J Displaced bimalleolar fracture of right lower leg, subsequent encounter for open fracture type IIIA, IIIB, or IIIC with delayed healing: Secondary | ICD-10-CM | POA: Diagnosis not present

## 2018-02-06 NOTE — Progress Notes (Signed)
Office Visit Note   Patient: Austin Dillon           Date of Birth: [birth year]           MRN: [id number] Visit Date: 02/06/2018              Requested by: [place], [place] [address] PCP: Baxter Hire, MD  No chief complaint on file.     HPI: Patient is a 67 year old gentleman who presents status post open reduction internal fixation for open ankle fracture on the right he had significant soft tissue injury that is healed remarkably well with wearing the medical compression sock.  Patient subsequently has fallen and broken in the right hip and underwent anterior hip replacement with Dr. Ninfa Linden.  Patient currently and will not fully weight-bear on the right foot.  Patient states he can move the ankle without pain.  Patient is set up for outpatient physical therapy at this time.  Assessment & Plan: Visit Diagnoses:  1. Open fracture ankle, bimalleolar, right, type III, with delayed healing, subsequent encounter   2. Pain in right foot     Plan: Work on outpatient physical therapy to improve range of motion of his ankle improved weightbearing.  We will reevaluate in 4 weeks with 2 view radiographs of the right ankle and right foot.  Discussed that if he has still pain with weightbearing through the right ankle we could consider arthroscopic fusion of the ankle.  Follow-Up Instructions: Return in about 4 weeks (around 03/06/2018).   Ortho Exam  Patient is alert, oriented, no adenopathy, well-dressed, normal affect, normal respiratory effort. Examination patient has equinus contracture on the right with dorsiflexion about 20 degrees short of neutral.  His foot is neurovascular intact the traumatic wound has completely healed.  There is no pain with range of motion of the midfoot.  Patient does have an antalgic uses a walker and will not put weight through his right lower extremity.  Imaging: No results found. No images are attached to  the encounter.  Labs: Lab Results  Component Value Date   REPTSTATUS 01/19/2018 FINAL 01/14/2018   CULT  01/14/2018    NO GROWTH 5 DAYS Performed at [facility], [place] [address] [place], [place] [address]     @[email or web address] ([email or web address])@  There is no height or weight on file to calculate BMI.  Orders:  No orders of the defined types were placed in this encounter.  No orders of the defined types were placed in this encounter.    Procedures: No procedures performed  Clinical Data: No additional findings.  ROS:  All other systems negative, except as noted in the HPI. Review of Systems  Objective: Vital Signs: There were no vitals taken for this visit.  Specialty Comments:  No specialty comments available.  PMFS History: Patient Active Problem List   Diagnosis Date Noted  . Closed right hip fracture, initial encounter ([place]) 01/12/2018  . Depression with anxiety 01/12/2018  . Open fracture ankle, bimalleolar, right, type III, with delayed healing, subsequent encounter 04/04/2017  . Open ankle fracture, right, type I or II, initial encounter 03/26/2017  . Type III open fracture of right ankle 03/26/2017   Past Medical History:  Diagnosis Date  . Alzheimer disease   . Headache     History reviewed. No pertinent family history.  Past Surgical History:  Procedure Laterality Date  . APPENDECTOMY    . EXTERNAL FIXATION LEG Right 03/26/2017  Procedure: EXTERNAL FIXATION RIGHT LEG;  Surgeon: Mcarthur Rossetti, MD;  Location: [facility];  Service: Orthopedics;  Laterality: Right;  . ORIF ANKLE FRACTURE Right 03/29/2017   Procedure: OPEN REDUCTION INTERNAL FIXATION (ORIF) RIGHT  ANKLE FRACTURE, Removal of External Fixator;  Surgeon: Newt Minion, MD;  Location: [facility];  Service: Orthopedics;  Laterality: Right;  . TOTAL HIP ARTHROPLASTY Right 01/13/2018   Procedure: RIGHT ANTERIOR TOTAL HIP REPLACEMENT;  Surgeon: Mcarthur Rossetti, MD;  Location: [facility];  Service:  Orthopedics;  Laterality: Right;   Social History   Occupational History  . Not on file  Tobacco Use  . Smoking status: Current Every Day Smoker    Packs/day: 1.00    Years: 25.00    Pack years: 25.00    Types: Cigarettes  . Smokeless tobacco: Never Used  Substance and Sexual Activity  . Alcohol use: Yes    Comment: SOCIAL  . Drug use: No  . Sexual activity: Not on file

## 2018-03-01 ENCOUNTER — Ambulatory Visit (INDEPENDENT_AMBULATORY_CARE_PROVIDER_SITE_OTHER): Payer: Medicare Other

## 2018-03-01 ENCOUNTER — Ambulatory Visit (INDEPENDENT_AMBULATORY_CARE_PROVIDER_SITE_OTHER): Payer: Medicare Other | Admitting: Orthopedic Surgery

## 2018-03-01 ENCOUNTER — Encounter (INDEPENDENT_AMBULATORY_CARE_PROVIDER_SITE_OTHER): Payer: Self-pay | Admitting: Orthopaedic Surgery

## 2018-03-01 ENCOUNTER — Ambulatory Visit (INDEPENDENT_AMBULATORY_CARE_PROVIDER_SITE_OTHER): Payer: Medicare Other | Admitting: Orthopaedic Surgery

## 2018-03-01 DIAGNOSIS — M79671 Pain in right foot: Secondary | ICD-10-CM

## 2018-03-01 DIAGNOSIS — S82841J Displaced bimalleolar fracture of right lower leg, subsequent encounter for open fracture type IIIA, IIIB, or IIIC with delayed healing: Secondary | ICD-10-CM | POA: Diagnosis not present

## 2018-03-01 DIAGNOSIS — Z96641 Presence of right artificial hip joint: Secondary | ICD-10-CM | POA: Insufficient documentation

## 2018-03-01 NOTE — Progress Notes (Signed)
The patient is now 7 weeks status post a right total hip arthroplasty to treat a displaced femoral neck fracture.  He is doing well from that standpoint.  He is on a walker due to chronic right foot and ankle issues that are followed by my partner Dr. Sharol Given.  As far as his hip goes he is doing well.  His incisions well-healed.  His ligaments are equal.  He tolerates me putting his right hip through full range of motion without any difficulty.  He complains of some mild right ankle pain but not severe.  X-rays of the pelvis show well-seated total hip arthroplasty on the right side.  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  From a hip standpoint I do not need to see him back for 6 months.  I like a low AP pelvis at that visit.  He will follow-up with Dr. Sharol Given next week for his foot and ankle.

## 2018-03-06 ENCOUNTER — Ambulatory Visit (INDEPENDENT_AMBULATORY_CARE_PROVIDER_SITE_OTHER): Payer: Medicare Other | Admitting: Orthopedic Surgery

## 2018-03-06 ENCOUNTER — Encounter (INDEPENDENT_AMBULATORY_CARE_PROVIDER_SITE_OTHER): Payer: Self-pay | Admitting: Orthopedic Surgery

## 2018-03-06 VITALS — Ht 68.0 in | Wt 180.0 lb

## 2018-03-06 DIAGNOSIS — S82841J Displaced bimalleolar fracture of right lower leg, subsequent encounter for open fracture type IIIA, IIIB, or IIIC with delayed healing: Secondary | ICD-10-CM | POA: Diagnosis not present

## 2018-03-06 MED ORDER — DOXYCYCLINE HYCLATE 100 MG PO TABS: 100.0000 mg | ORAL_TABLET | Freq: Two times a day (BID) | ORAL | 0 refills | Status: DC

## 2018-03-06 NOTE — Progress Notes (Signed)
Office Visit Note   Patient: Austin Dillon           Date of Birth: 26-Aug-1951           MRN: [id number] Visit Date: 03/06/2018              Requested by: Baxter Hire, MD [place], [place] [address] PCP: Baxter Hire, MD  Chief Complaint  Patient presents with  . Right Ankle - Follow-up    03/29/17 s/p ORIF ankle fx      HPI: Patient is a 67 year old gentleman who presents in follow-up status post ankle fracture open on the right.  He is also status post a hip fracture.  Patient states he is noticed a new blister posterior medial aspect of the right ankle.  Patient popped the blister and he states that some fluid came out.  Assessment & Plan: Visit Diagnoses:  1. Open fracture ankle, bimalleolar, right, type III, with delayed healing, subsequent encounter     Plan: Will have them continue with the compression stocking recommended Bactroban dressing changes daily with Dial soap cleansing prescription called in for doxycycline.  Follow-Up Instructions: No follow-ups on file.   Ortho Exam  Patient is alert, oriented, no adenopathy, well-dressed, normal affect, normal respiratory effort. Examination patient has pain-free range of motion of the ankle there is no cellulitis.  There is a small blister that has popped posterior to his previous traumatic wound.  This wound is 3 mm in diameter and 5 mm deep.  This does not probe to bone or tendon.  I cannot express any fluid.  Imaging: No results found. No images are attached to the encounter.  Labs: Lab Results  Component Value Date   REPTSTATUS 01/19/2018 FINAL 01/14/2018   CULT  01/14/2018    NO GROWTH 5 DAYS Performed at [facility], [place] [address], [place], [place] [address]     @[email or web address] [phone]  Body mass index is 27.37 kg/m.  Orders:  No orders of the defined types were placed in this encounter.  No orders of the defined types were placed in this encounter.    Procedures: No procedures performed  Clinical Data: No additional findings.  ROS:  All other systems negative, except as noted in the HPI. Review of Systems  Objective: Vital Signs: Ht 5\' 8"  (1.727 m)   Wt 180 lb (81.6 kg)   BMI 27.37 kg/m   Specialty Comments:  No specialty comments available.  PMFS History: Patient Active Problem List   Diagnosis Date Noted  . 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  . Closed right hip fracture, initial encounter ([place]) 01/12/2018  . Depression with anxiety 01/12/2018  . Open fracture ankle, bimalleolar, right, type III, with delayed healing, subsequent encounter 04/04/2017  . Open ankle fracture, right, type I or II, initial encounter 03/26/2017  . Type III open fracture of right ankle 03/26/2017   Past Medical History:  Diagnosis Date  . Alzheimer disease   . Headache     History reviewed. No pertinent family history.  Past Surgical History:  Procedure Laterality Date  . APPENDECTOMY    . EXTERNAL FIXATION LEG Right 03/26/2017   Procedure: EXTERNAL FIXATION RIGHT LEG;  Surgeon: Mcarthur Rossetti, MD;  Location: [facility];  Service: Orthopedics;  Laterality: Right;  . ORIF ANKLE FRACTURE Right 03/29/2017   Procedure: OPEN REDUCTION INTERNAL FIXATION (ORIF) RIGHT  ANKLE FRACTURE, Removal of External Fixator;  Surgeon: Newt Minion,  MD;  Location: [facility];  Service: Orthopedics;  Laterality: Right;  . TOTAL HIP ARTHROPLASTY Right 01/13/2018   Procedure: RIGHT ANTERIOR TOTAL HIP REPLACEMENT;  Surgeon: Mcarthur Rossetti, MD;  Location: [facility];  Service: Orthopedics;  Laterality: Right;   Social History   Occupational History  . Not on file  Tobacco Use  . Smoking status: Current Every Day Smoker    Packs/day: 1.00    Years: 25.00    Pack years: 25.00    Types: Cigarettes  . Smokeless tobacco: Never Used  Substance and Sexual Activity  . Alcohol use: Yes    Comment: SOCIAL  . Drug use: No  . Sexual activity:  Not on file

## 2018-03-20 ENCOUNTER — Encounter (INDEPENDENT_AMBULATORY_CARE_PROVIDER_SITE_OTHER): Payer: Self-pay | Admitting: Orthopedic Surgery

## 2018-03-20 ENCOUNTER — Ambulatory Visit (INDEPENDENT_AMBULATORY_CARE_PROVIDER_SITE_OTHER): Payer: Medicare Other | Admitting: Orthopedic Surgery

## 2018-03-20 VITALS — Ht 68.0 in | Wt 180.0 lb

## 2018-03-20 DIAGNOSIS — S82841J Displaced bimalleolar fracture of right lower leg, subsequent encounter for open fracture type IIIA, IIIB, or IIIC with delayed healing: Secondary | ICD-10-CM

## 2018-03-20 NOTE — Progress Notes (Signed)
Office Visit Note   Patient: Austin Dillon           Date of Birth: [birth year]           MRN: [id number] Visit Date: 03/20/2018              Requested by: Baxter Hire, MD [place], [place] [address] PCP: Baxter Hire, MD  Chief Complaint  Patient presents with  . Right Ankle - Follow-up    03/29/17 ORIF ankle fx       HPI: Patient is a 67 year old gentleman who is status post open reduction internal fixation for open ankle fracture dislocation.  Patient is a year out from surgery.  He has completed a course of doxycycline Bactroban is wearing his medical compression stockings.  Patient is still on crutches and is reluctant to weight-bear.  Assessment & Plan: Visit Diagnoses:  1. Open fracture ankle, bimalleolar, right, type III, with delayed healing, subsequent encounter     Plan: Patient is given instructions that he needs to be full weightbearing at follow-up in 2 months.  He is given instructions for heel cord stretching he will discontinue the doxycycline discontinue the Bactroban continue the compression stockings continue with his crutches and put full weight on his foot for every step.  Follow-Up Instructions: Return in about 2 months (around 05/20/2018).   Ortho Exam  Patient is alert, oriented, no adenopathy, well-dressed, normal affect, normal respiratory effort. Examination patient has minimal venous stasis swelling the right lower extremity there is no redness no cellulitis no signs of infection the large traumatic wound has completely healed medially.  His foot is neutral dorsiflexion with his knee extended.  Imaging: No results found. No images are attached to the encounter.  Labs: Lab Results  Component Value Date   REPTSTATUS 01/19/2018 FINAL 01/14/2018   CULT  01/14/2018    NO GROWTH 5 DAYS Performed at [facility], [place] [address], [place], [place] [address]     @[email or web address] [phone]  Body mass index is 27.37  kg/m.  Orders:  No orders of the defined types were placed in this encounter.  No orders of the defined types were placed in this encounter.    Procedures: No procedures performed  Clinical Data: No additional findings.  ROS:  All other systems negative, except as noted in the HPI. Review of Systems  Objective: Vital Signs: Ht 5\' 8"  (1.727 m)   Wt 180 lb (81.6 kg)   BMI 27.37 kg/m   Specialty Comments:  No specialty comments available.  PMFS History: Patient Active Problem List   Diagnosis Date Noted  . 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  . Closed right hip fracture, initial encounter ([place]) 01/12/2018  . Depression with anxiety 01/12/2018  . Open fracture ankle, bimalleolar, right, type III, with delayed healing, subsequent encounter 04/04/2017  . Open ankle fracture, right, type I or II, initial encounter 03/26/2017  . Type III open fracture of right ankle 03/26/2017   Past Medical History:  Diagnosis Date  . Alzheimer disease   . Headache     History reviewed. No pertinent family history.  Past Surgical History:  Procedure Laterality Date  . APPENDECTOMY    . EXTERNAL FIXATION LEG Right 03/26/2017   Procedure: EXTERNAL FIXATION RIGHT LEG;  Surgeon: Mcarthur Rossetti, MD;  Location: [facility];  Service: Orthopedics;  Laterality: Right;  . ORIF ANKLE FRACTURE Right 03/29/2017   Procedure: OPEN REDUCTION INTERNAL FIXATION (ORIF)  RIGHT  ANKLE FRACTURE, Removal of External Fixator;  Surgeon: Newt Minion, MD;  Location: [facility];  Service: Orthopedics;  Laterality: Right;  . TOTAL HIP ARTHROPLASTY Right 01/13/2018   Procedure: RIGHT ANTERIOR TOTAL HIP REPLACEMENT;  Surgeon: Mcarthur Rossetti, MD;  Location: [facility];  Service: Orthopedics;  Laterality: Right;   Social History   Occupational History  . Not on file  Tobacco Use  . Smoking status: Current Every Day Smoker    Packs/day: 1.00    Years: 25.00    Pack years: 25.00    Types:  Cigarettes  . Smokeless tobacco: Never Used  Substance and Sexual Activity  . Alcohol use: Yes    Comment: SOCIAL  . Drug use: No  . Sexual activity: Not on file

## 2018-03-29 ENCOUNTER — Telehealth (INDEPENDENT_AMBULATORY_CARE_PROVIDER_SITE_OTHER): Payer: Self-pay | Admitting: Orthopaedic Surgery

## 2018-03-29 NOTE — Telephone Encounter (Signed)
Austin Dillon-(PT) with [facility] called advised patient had to cancel his appointment today. They will start back up next week. The number to contact Austin Baas is [phone]

## 2018-04-03 ENCOUNTER — Telehealth (INDEPENDENT_AMBULATORY_CARE_PROVIDER_SITE_OTHER): Payer: Self-pay | Admitting: Orthopedic Surgery

## 2018-04-03 NOTE — Telephone Encounter (Signed)
Austin Dillon-(PT) called needing verbal orders for 2 week 4. Chrys Racer also said she did recertification yesterday and medication interaction with Lamotrigine and Barbiturates. The number to contact Chrys Racer is [phone]

## 2018-04-03 NOTE — Telephone Encounter (Signed)
Called and sw caroline to advise verbal ok for orders below.

## 2018-05-18 ENCOUNTER — Emergency Department: Payer: Medicare Other

## 2018-05-18 ENCOUNTER — Emergency Department
Admission: EM | Admit: 2018-05-18 | Discharge: 2018-05-18 | Disposition: A | Discharge status: Home / Self Care | Payer: Medicare Other | Attending: Emergency Medicine | Admitting: Emergency Medicine

## 2018-05-18 ENCOUNTER — Encounter: Payer: Self-pay | Admitting: Emergency Medicine

## 2018-05-18 ENCOUNTER — Other Ambulatory Visit: Payer: Self-pay

## 2018-05-18 DIAGNOSIS — S51011A Laceration without foreign body of right elbow, initial encounter: Secondary | ICD-10-CM | POA: Diagnosis not present

## 2018-05-18 DIAGNOSIS — Z7982 Long term (current) use of aspirin: Secondary | ICD-10-CM | POA: Diagnosis not present

## 2018-05-18 DIAGNOSIS — Z96641 Presence of right artificial hip joint: Secondary | ICD-10-CM | POA: Insufficient documentation

## 2018-05-18 DIAGNOSIS — G309 Alzheimer's disease, unspecified: Secondary | ICD-10-CM | POA: Insufficient documentation

## 2018-05-18 DIAGNOSIS — Y999 Unspecified external cause status: Secondary | ICD-10-CM | POA: Diagnosis not present

## 2018-05-18 DIAGNOSIS — Y92511 Restaurant or cafe as the place of occurrence of the external cause: Secondary | ICD-10-CM | POA: Insufficient documentation

## 2018-05-18 DIAGNOSIS — W01198A Fall on same level from slipping, tripping and stumbling with subsequent striking against other object, initial encounter: Secondary | ICD-10-CM | POA: Insufficient documentation

## 2018-05-18 DIAGNOSIS — F17210 Nicotine dependence, cigarettes, uncomplicated: Secondary | ICD-10-CM | POA: Diagnosis not present

## 2018-05-18 DIAGNOSIS — S0181XA Laceration without foreign body of other part of head, initial encounter: Secondary | ICD-10-CM | POA: Insufficient documentation

## 2018-05-18 DIAGNOSIS — Y9389 Activity, other specified: Secondary | ICD-10-CM | POA: Diagnosis not present

## 2018-05-18 DIAGNOSIS — S098XXA Other specified injuries of head, initial encounter: Secondary | ICD-10-CM | POA: Diagnosis present

## 2018-05-18 MED ORDER — CEPHALEXIN 500 MG PO CAPS: 500.0000 mg | ORAL_CAPSULE | Freq: Four times a day (QID) | ORAL | 0 refills | Status: AC

## 2018-05-18 MED ORDER — BACITRACIN-NEOMYCIN-POLYMYXIN 400-5-5000 EX OINT
TOPICAL_OINTMENT | Freq: Once | CUTANEOUS | Status: AC
  Administered 2018-05-18: 1 via TOPICAL
  Filled 2018-05-18 (×2): qty 1

## 2018-05-18 MED ORDER — LIDOCAINE HCL (PF) 1 % IJ SOLN
5.0000 mL | Freq: Once | INTRAMUSCULAR | Status: AC
  Administered 2018-05-18: 5 mL via INTRADERMAL
  Filled 2018-05-18: qty 5

## 2018-05-18 NOTE — ED Notes (Signed)
Denies blood thinners.

## 2018-05-18 NOTE — ED Notes (Signed)
See triage note  States he tripped on crutches  Hit head on sign  No LOC

## 2018-05-18 NOTE — ED Triage Notes (Signed)
Pt to ED with family after eating and got up to use the restroom and using crutches and got tripped with the crutches, denies dizziness or LOC,  Hit head on a sign in restaurant.  Skin tear to right elbow and laceration, bleeding controlled to right eyebrow.  Denies eye pain or visual changes, A&Ox4, skin warm and dry.

## 2018-05-18 NOTE — ED Provider Notes (Signed)
[facility] Emergency Department Provider Note  ____________________________________________  Time seen: Approximately 6:48 PM  I have reviewed the triage vital signs and the nursing notes.   HISTORY  Chief Complaint Fall    HPI Austin Dillon is a 67 y.o. male that presents emergency department for evaluation of injury after falling today.  Patient was out to eat [employer] with his son when he got up to use the bathroom.  He uses crutches for chronic foot pain and slipped on some water.  He hit his head on a sign that was at [employer].  He did not lose consciousness.  He denies any pain currently.  He has a laceration to his forehead and a skin tear to his right arm.  No blood thinners.  Last tetanus shot was last year.  He has an appointment at the [facility] on Tuesday.  No headache, visual changes, neck pain, shortness of breath, chest pain, nausea, vomiting, abdominal pain.  Past Medical History:  Diagnosis Date  . Alzheimer disease   . Headache     Patient Active Problem List   Diagnosis Date Noted  . 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  . Closed right hip fracture, initial encounter ([place]) 01/12/2018  . Depression with anxiety 01/12/2018  . Open fracture ankle, bimalleolar, right, type III, with delayed healing, subsequent encounter 04/04/2017  . Open ankle fracture, right, type I or II, initial encounter 03/26/2017  . Type III open fracture of right ankle 03/26/2017    Past Surgical History:  Procedure Laterality Date  . APPENDECTOMY    . EXTERNAL FIXATION LEG Right 03/26/2017   Procedure: EXTERNAL FIXATION RIGHT LEG;  Surgeon: Mcarthur Rossetti, MD;  Location: [facility];  Service: Orthopedics;  Laterality: Right;  . ORIF ANKLE FRACTURE Right 03/29/2017   Procedure: OPEN REDUCTION INTERNAL FIXATION (ORIF) RIGHT  ANKLE FRACTURE, Removal of External Fixator;  Surgeon: Newt Minion, MD;  Location: [facility];  Service: Orthopedics;   Laterality: Right;  . TOTAL HIP ARTHROPLASTY Right 01/13/2018   Procedure: RIGHT ANTERIOR TOTAL HIP REPLACEMENT;  Surgeon: Mcarthur Rossetti, MD;  Location: [facility];  Service: Orthopedics;  Laterality: Right;    Prior to Admission medications   Medication Sig Start Date End Date Taking? Authorizing Provider  alprazolam Duanne Moron) 2 MG tablet Take 2 mg by mouth 3 (three) times daily.     [provider]  aspirin EC 81 MG tablet Take 1 tablet (81 mg total) by mouth 2 (two) times daily. 01/17/18   Hongalgi, Lenis Dickinson, MD  butalbital-acetaminophen-caffeine (FIORICET, ESGIC) [phone] MG tablet Take 1 tablet by mouth daily as needed for headache.    [provider]  cephALEXin (KEFLEX) 500 MG capsule Take 1 capsule (500 mg total) by mouth 4 (four) times daily for 10 days. 05/18/18 05/28/18  Laban Emperor, PA-C  Cholecalciferol (VITAMIN D-3) 1000 units CAPS Take 1,000 Units by mouth daily.    [provider]  doxycycline (VIBRA-TABS) 100 MG tablet Take 1 tablet (100 mg total) by mouth 2 (two) times daily. 03/06/18   Newt Minion, MD  DULoxetine (CYMBALTA) 20 MG capsule Take 20 mg by mouth daily. 08/29/16   [provider]  feeding supplement, ENSURE ENLIVE, (ENSURE ENLIVE) LIQD Take 237 mLs by mouth daily at 3 pm. 01/17/18   Hongalgi, Lenis Dickinson, MD  ferrous sulfate 325 (65 FE) MG tablet Take 1 tablet (325 mg total) by mouth 3 (three) times daily with meals  for 20 days. 01/16/18 02/05/18  Elwin Mocha, MD  lamoTRIgine (LAMICTAL) 100 MG tablet Take 100-150 mg by mouth See admin instructions. 100 mg by mouth in the morning and 150 mg in the evening    [provider]  memantine (NAMENDA) 10 MG tablet Take 10 mg by mouth 2 (two) times daily. 01/08/18   [provider]  methocarbamol (ROBAXIN) 500 MG tablet Take 1 tablet (500 mg total) by mouth every 6 (six) hours as needed for muscle spasms. 01/16/18   Elwin Mocha, MD  oxyCODONE-acetaminophen (ROXICET) 5-325  MG tablet Take 1-2 tablets by mouth every 6 (six) hours as needed for severe pain. 01/31/18   Mcarthur Rossetti, MD  senna-docusate (SENOKOT-S) 8.6-50 MG tablet Take 1 tablet by mouth at bedtime as needed for mild constipation or moderate constipation. 01/17/18   Hongalgi, Lenis Dickinson, MD  tamsulosin (FLOMAX) 0.4 MG CAPS capsule Take 0.4 mg by mouth at bedtime. 10/09/17   [provider]    Allergies Nortriptyline and Aricept [donepezil]  History reviewed. No pertinent family history.  Social History Social History   Tobacco Use  . Smoking status: Current Every Day Smoker    Packs/day: 0.50    Years: 25.00    Pack years: 12.50    Types: Cigarettes  . Smokeless tobacco: Never Used  Substance Use Topics  . Alcohol use: Yes    Comment: SOCIAL  . Drug use: No     Review of Systems  Cardiovascular: No chest pain. Respiratory: No SOB. Gastrointestinal: No abdominal pain.  No nausea, no vomiting.  Musculoskeletal: Negative for musculoskeletal pain. Skin: Negative for rash, ecchymosis.  Positive for laceration. Neurological: Negative for headaches   ____________________________________________   PHYSICAL EXAM:  VITAL SIGNS: ED Triage Vitals  Enc Vitals Group     BP 05/18/18 1624 115/68     Pulse Rate 05/18/18 1624 83     Resp 05/18/18 1624 14     Temp 05/18/18 1624 98 F (36.7 C)     Temp Source 05/18/18 1624 Oral     SpO2 05/18/18 1624 96 %     Weight 05/18/18 1625 180 lb (81.6 kg)     Height 05/18/18 1625 5\' 8"  (1.727 m)     Head Circumference --      Peak Flow --      Pain Score 05/18/18 1624 6     Pain Loc --      Pain Edu? --      Excl. in [place]? --      Constitutional: Alert and oriented. Well appearing and in no acute distress. Eyes: Conjunctivae are normal. PERRL. EOMI. Head: 1 cm x 1 cm x 2 cm Y-shaped laceration to right eyebrow.  No ecchymosis. ENT:      Ears:      Nose: No congestion/rhinnorhea.      Mouth/Throat: Mucous membranes are  moist.  Neck: No stridor.  No cervical spine tenderness to palpation. Cardiovascular: Normal rate, regular rhythm.  Good peripheral circulation. Respiratory: Normal respiratory effort without tachypnea or retractions. Lungs CTAB. Good air entry to the bases with no decreased or absent breath sounds. Gastrointestinal: Bowel sounds 4 quadrants. Soft and nontender to palpation. No guarding or rigidity. No palpable masses. No distention.  Musculoskeletal: Full range of motion to all extremities. No gross deformities appreciated.  Weightbearing. Neurologic:  Normal speech and language. No gross focal neurologic deficits are appreciated.  Skin:  Skin is warm, dry. 1 cm x 1 cm  skin tear and 1 cm x 2 cm skin tear to right forearm. Psychiatric: Mood and affect are normal. Speech and behavior are normal. Patient exhibits appropriate insight and judgement.   ____________________________________________   LABS (all labs ordered are listed, but only abnormal results are displayed)  Labs Reviewed - No data to display ____________________________________________  EKG   ____________________________________________  RADIOLOGY Robinette Haines, personally viewed and evaluated these images (plain radiographs) as part of my medical decision making, as well as reviewing the written report by the radiologist.  Ct Head Wo Contrast  Result Date: 05/18/2018 CLINICAL DATA:  Head trauma.  Fall EXAM: CT HEAD WITHOUT CONTRAST TECHNIQUE: Contiguous axial images were obtained from the base of the skull through the vertex without intravenous contrast. COMPARISON:  MRI head 12/21/2017 FINDINGS: Brain: Mild atrophy. Negative for acute infarct, hemorrhage, or mass lesion. No fluid collection or midline shift. Vascular: Negative for hyperdense vessel Skull: Negative Sinuses/Orbits: Mild mucosal edema paranasal sinuses. Negative orbit Other: None IMPRESSION: No acute intracranial abnormality.  Mild atrophy. Electronically  Signed   By: Franchot Gallo M.D.   On: 05/18/2018 17:30    ____________________________________________    PROCEDURES  Procedure(s) performed:    Marland KitchenMarland KitchenLaceration Repair Date/Time: 05/18/2018 6:52 PM Performed by: Laban Emperor, PA-C Authorized by: Laban Emperor, PA-C   Consent:    Consent obtained:  Verbal   Consent given by:  Patient   Risks discussed:  Infection, pain, poor cosmetic result and poor wound healing Anesthesia (see MAR for exact dosages):    Anesthesia method:  Local infiltration   Local anesthetic:  Lidocaine 1% w/o epi Laceration details:    Location:  Face Repair type:    Repair type:  Simple Exploration:    Hemostasis achieved with:  Direct pressure   Wound exploration: entire depth of wound probed and visualized     Contaminated: no   Treatment:    Area cleansed with:  Saline   Amount of cleaning:  Extensive   Irrigation solution:  Sterile saline   Irrigation method:  Syringe Mucous membrane repair:    Suture size:  5-0   Suture material:  Vicryl   Number of sutures:  5 Skin repair:    Repair method:  Sutures   Suture size:  5-0   Suture material:  Nylon   Suture technique:  Simple interrupted   Number of sutures:  13 Approximation:    Approximation:  Close Post-procedure details:    Dressing:  Sterile dressing   Patient tolerance of procedure:  Tolerated well, no immediate complications      Medications  lidocaine (PF) (XYLOCAINE) 1 % injection 5 mL (has no administration in time range)  neomycin-bacitracin-polymyxin (NEOSPORIN) ointment (has no administration in time range)     ____________________________________________   INITIAL IMPRESSION / ASSESSMENT AND PLAN / ED COURSE  Pertinent labs & imaging results that were available during my care of the patient were reviewed by me and considered in my medical decision making (see chart for details).  Review of the [place] CSRS was performed in accordance of the [place] prior to dispensing  any controlled drugs.   Patient presented to emergency department for evaluation after fall.  Vital signs and exam are reassuring.  Head CT negative for acute intracranial abnormalities.  Forehead laceration was repaired with stitches.  Nonadhesive dressing was placed to right forearm.  Patient denies any pain.  Patient has an appointment with [facility] on Tuesday.  Patient will be discharged home with prescriptions for Keflex.  Patient is to follow up with wound center as directed. Patient is given ED precautions to return to the ED for any worsening or new symptoms.     ____________________________________________  FINAL CLINICAL IMPRESSION(S) / ED DIAGNOSES  Final diagnoses:  Facial laceration, initial encounter  Skin tear of right elbow without complication, initial encounter      NEW MEDICATIONS STARTED DURING THIS VISIT:  ED Discharge Orders        Ordered    cephALEXin (KEFLEX) 500 MG capsule  4 times daily     05/18/18 1824          This chart was dictated using voice recognition software/Dragon. Despite best efforts to proofread, errors can occur which can change the meaning. Any change was purely unintentional.    Laban Emperor, PA-C 05/18/18 1855    Darel Hong, MD 05/18/18 2133

## 2018-05-21 ENCOUNTER — Ambulatory Visit (INDEPENDENT_AMBULATORY_CARE_PROVIDER_SITE_OTHER): Payer: Medicare Other | Admitting: Orthopedic Surgery

## 2018-05-31 ENCOUNTER — Ambulatory Visit (INDEPENDENT_AMBULATORY_CARE_PROVIDER_SITE_OTHER): Payer: Medicare Other | Admitting: Orthopedic Surgery

## 2018-05-31 ENCOUNTER — Encounter (INDEPENDENT_AMBULATORY_CARE_PROVIDER_SITE_OTHER): Payer: Self-pay | Admitting: Orthopedic Surgery

## 2018-05-31 VITALS — Ht 68.0 in | Wt 180.0 lb

## 2018-05-31 DIAGNOSIS — S82841J Displaced bimalleolar fracture of right lower leg, subsequent encounter for open fracture type IIIA, IIIB, or IIIC with delayed healing: Secondary | ICD-10-CM | POA: Diagnosis not present

## 2018-05-31 DIAGNOSIS — M6701 Short Achilles tendon (acquired), right ankle: Secondary | ICD-10-CM

## 2018-05-31 NOTE — Progress Notes (Signed)
Office Visit Note   Patient: Austin Dillon           Date of Birth: November 02, 1951           MRN: [id number] Visit Date: 05/31/2018              Requested by: Baxter Hire, MD [place], [place] [address] PCP: Baxter Hire, MD  Chief Complaint  Patient presents with  . Right Ankle - Follow-up    03/29/17 ORIF right ankle fx      HPI: Patient is 1 year 2 months status post open reduction internal fixation right ankle fracture.  He is still using crutches.  Patient states he did go out to dinner slipped on his crutches and sustained a laceration and abrasion.  Patient is currently wearing his medical compression stockings.  He states he did go to physical therapy started using a TENS unit and this felt much better.  Assessment & Plan: Visit Diagnoses:  1. Open fracture ankle, bimalleolar, right, type III, with delayed healing, subsequent encounter   2. Achilles tendon contracture, right     Plan: Patient will increase his physical therapy sessions he will obtain a TENS unit reevaluate in 2 months.  Follow-Up Instructions: Return in about 2 months (around 07/31/2018).   Ortho Exam  Patient is alert, oriented, no adenopathy, well-dressed, normal affect, normal respiratory effort. Examination patient still has decreased range of motion of the ankle from contractures from nonweightbearing.  There is no swelling no cellulitis no signs of infection no signs of DVT.  Patient still has limited range of motion of his ankle due to the adhesions.  Imaging: No results found. No images are attached to the encounter.  Labs: Lab Results  Component Value Date   REPTSTATUS 01/19/2018 FINAL 01/14/2018   CULT  01/14/2018    NO GROWTH 5 DAYS Performed at [facility], [place] [address] [place], [place] [address]      Lab Results  Component Value Date   ALBUMIN 3.5 03/26/2017    Body mass index is 27.37 kg/m.  Orders:  No orders of the defined types  were placed in this encounter.  No orders of the defined types were placed in this encounter.    Procedures: No procedures performed  Clinical Data: No additional findings.  ROS:  All other systems negative, except as noted in the HPI. Review of Systems  Objective: Vital Signs: Ht 5\' 8"  (1.727 m)   Wt 180 lb (81.6 kg)   BMI 27.37 kg/m   Specialty Comments:  No specialty comments available.  PMFS History: Patient Active Problem List   Diagnosis Date Noted  . 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  . Closed right hip fracture, initial encounter ([place]) 01/12/2018  . Depression with anxiety 01/12/2018  . Open fracture ankle, bimalleolar, right, type III, with delayed healing, subsequent encounter 04/04/2017  . Open ankle fracture, right, type I or II, initial encounter 03/26/2017  . Type III open fracture of right ankle 03/26/2017   Past Medical History:  Diagnosis Date  . Alzheimer disease   . Headache     History reviewed. No pertinent family history.  Past Surgical History:  Procedure Laterality Date  . APPENDECTOMY    . EXTERNAL FIXATION LEG Right 03/26/2017   Procedure: EXTERNAL FIXATION RIGHT LEG;  Surgeon: Mcarthur Rossetti, MD;  Location: [facility];  Service: Orthopedics;  Laterality: Right;  . ORIF ANKLE FRACTURE Right 03/29/2017  Procedure: OPEN REDUCTION INTERNAL FIXATION (ORIF) RIGHT  ANKLE FRACTURE, Removal of External Fixator;  Surgeon: Newt Minion, MD;  Location: [facility];  Service: Orthopedics;  Laterality: Right;  . TOTAL HIP ARTHROPLASTY Right 01/13/2018   Procedure: RIGHT ANTERIOR TOTAL HIP REPLACEMENT;  Surgeon: Mcarthur Rossetti, MD;  Location: [facility];  Service: Orthopedics;  Laterality: Right;   Social History   Occupational History  . Not on file  Tobacco Use  . Smoking status: Current Every Day Smoker    Packs/day: 0.50    Years: 25.00    Pack years: 12.50    Types: Cigarettes  . Smokeless tobacco: Never Used  Substance  and Sexual Activity  . Alcohol use: Yes    Comment: SOCIAL  . Drug use: No  . Sexual activity: Not on file

## 2018-07-31 ENCOUNTER — Ambulatory Visit (INDEPENDENT_AMBULATORY_CARE_PROVIDER_SITE_OTHER): Payer: Medicare Other | Admitting: Orthopedic Surgery

## 2018-09-03 ENCOUNTER — Ambulatory Visit (INDEPENDENT_AMBULATORY_CARE_PROVIDER_SITE_OTHER): Payer: Medicare Other

## 2018-09-03 ENCOUNTER — Encounter (INDEPENDENT_AMBULATORY_CARE_PROVIDER_SITE_OTHER): Payer: Self-pay | Admitting: Orthopaedic Surgery

## 2018-09-03 ENCOUNTER — Ambulatory Visit (INDEPENDENT_AMBULATORY_CARE_PROVIDER_SITE_OTHER): Payer: Medicare Other | Admitting: Orthopaedic Surgery

## 2018-09-03 DIAGNOSIS — Z96641 Presence of right artificial hip joint: Secondary | ICD-10-CM

## 2018-09-03 NOTE — Progress Notes (Signed)
The patient is now 8 months status post a right total hip arthroplasty.  The hip is doing well.  He still having problems with pain from previous surgery as it involves his right foot and ankle.  He is ambulating with crutches due to this.  His wife is with him and says he has some good days and bad days.  He says his right hip is having no issues.  This is a hip we had replaced after having a mechanical fall in which she sustained a displaced femoral neck fracture.  On examination his right hip I can move it fully through internal and external rotation and can present with no pain at all.  X-rays and pelvis show well-seated total hip arthroplasty on the right side with no complicating features.  From my standpoint as far as his hip goes he can follow-up as needed.  Apparently they may have missed her last appointment with Dr. Sharol Given so we will see that they get a follow-up with him sometime in the next week to 2 weeks with new x-rays of his right foot and ankle.

## 2018-09-11 ENCOUNTER — Ambulatory Visit (INDEPENDENT_AMBULATORY_CARE_PROVIDER_SITE_OTHER): Payer: Medicare Other | Admitting: Orthopedic Surgery

## 2019-02-16 IMAGING — RF DG ANKLE 2V *R*
1 series · 2 of 2 positions shown · non-contrast
Comparison: Preoperative study from 03/26/2017

CLINICAL DATA: External fixation of right ankle fracture

EXAM:
DG C-ARM 61-120 MIN; RIGHT ANKLE - 2 VIEW

[Series 1: run · 2 of 2 slices shown]
[im 1/2]
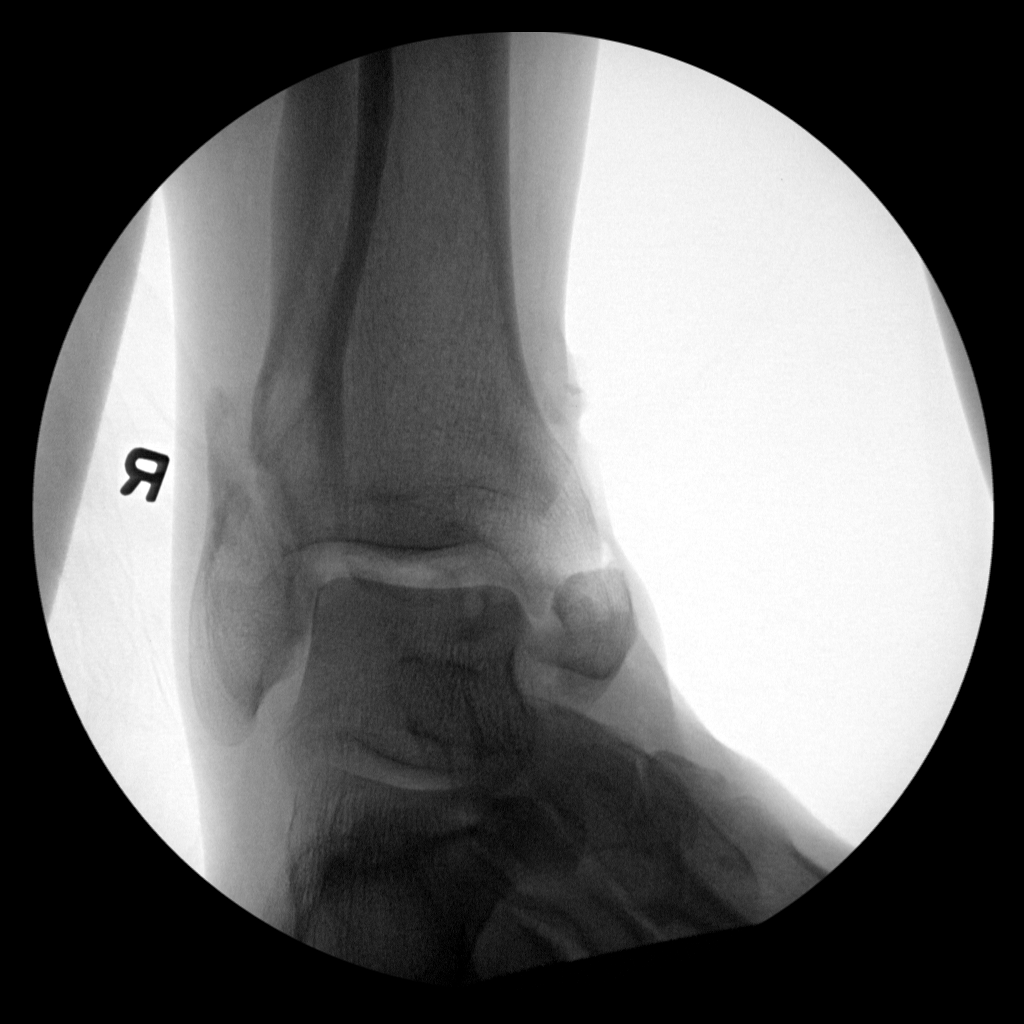
[im 2/2]
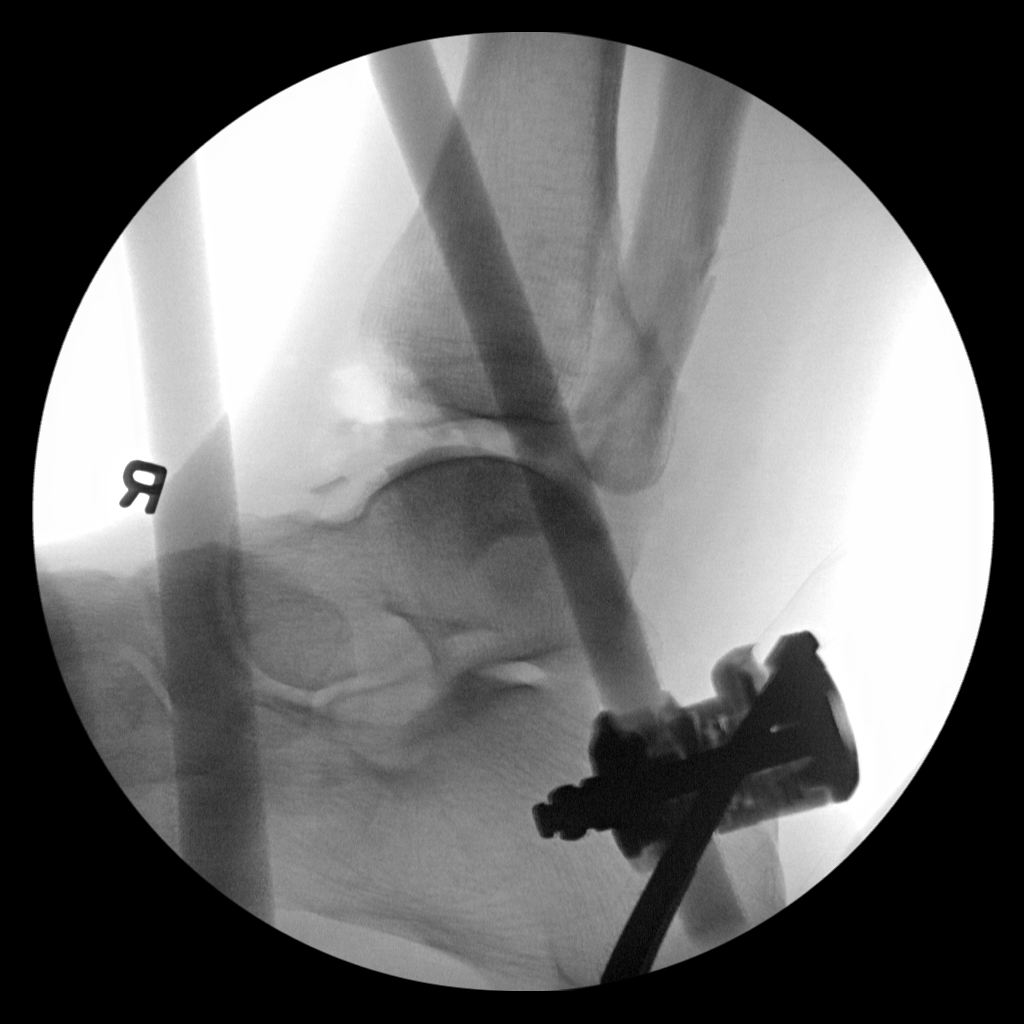

[2 of 2 positions shown; findings below may reference images not displayed]

FINDINGS: A total of 41 seconds was utilized for external fixation reduction
of trimalleolar fracture dislocation of the right ankle. Alignment
is improved and closer to anatomic.
IMPRESSION: External fixation and reduction of trimalleolar fracture-dislocation
of right ankle.

## 2019-02-16 IMAGING — CR DG TIBIA/FIBULA PORT 2V*R*
4 series · 4 of 4 positions shown · non-contrast
Comparison: None.

CLINICAL DATA: Ankle pain after fall 12 feet.

EXAM:
PORTABLE RIGHT TIBIA AND FIBULA - 2 VIEW

[AP (1 of 2)]
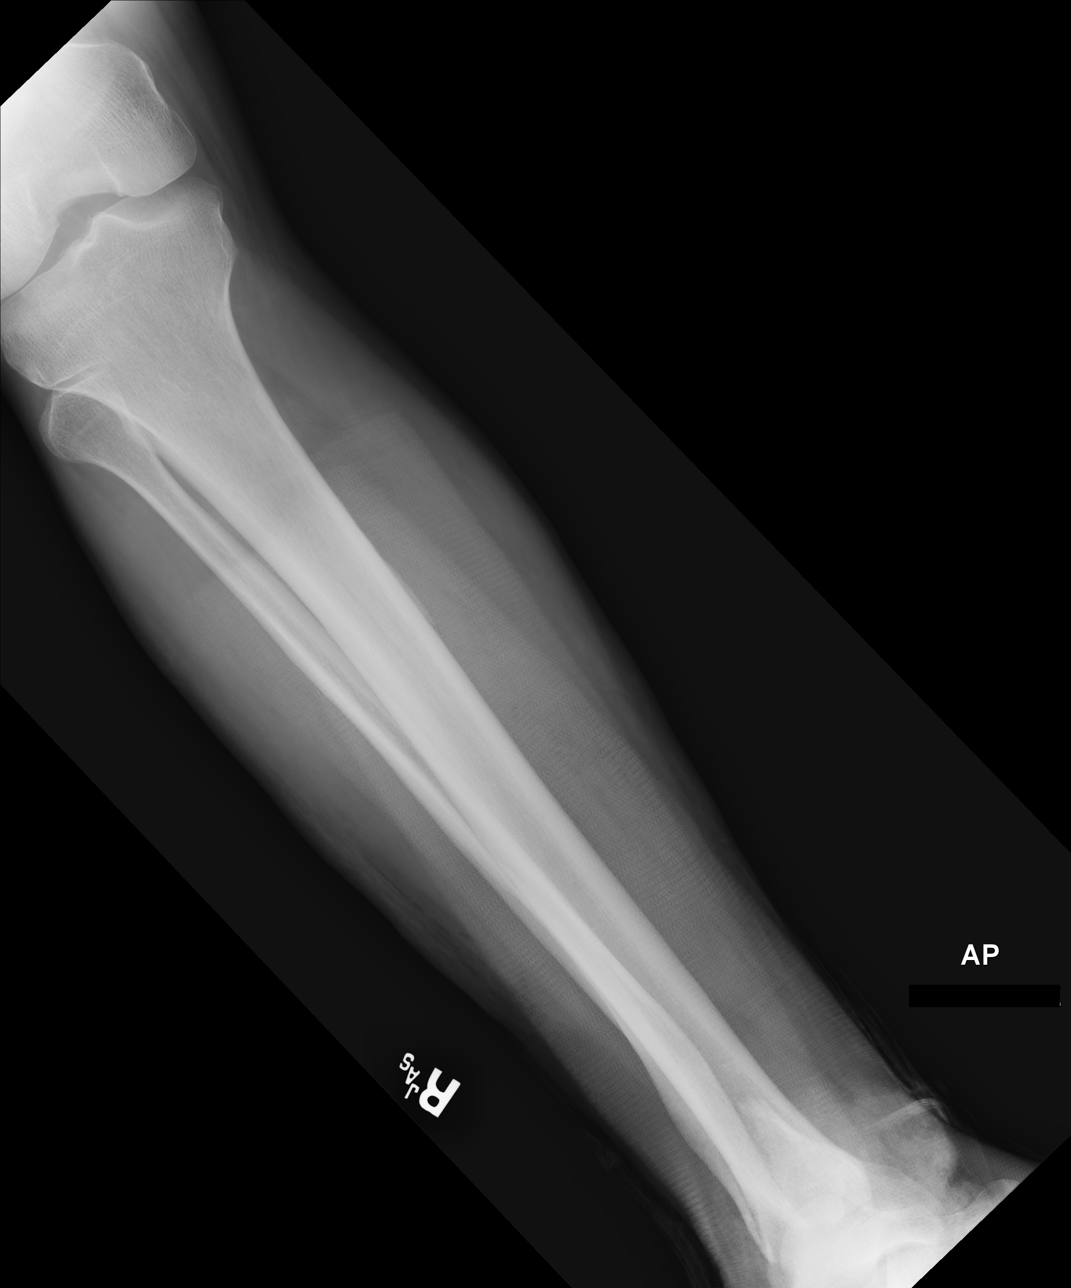

[lateral (1 of 2)]
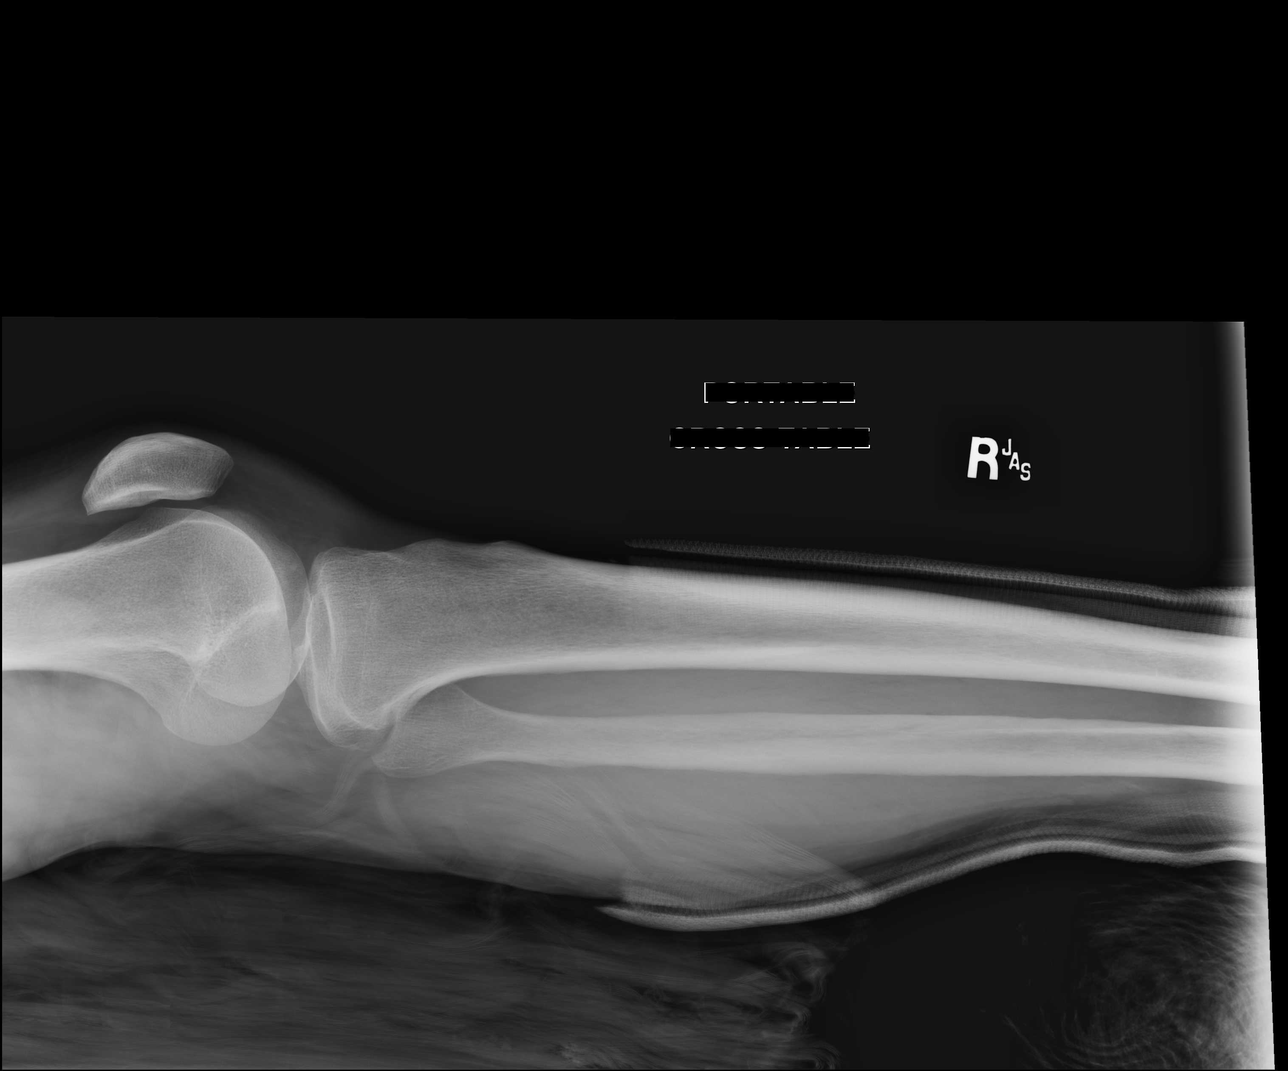

[lateral (2 of 2)]
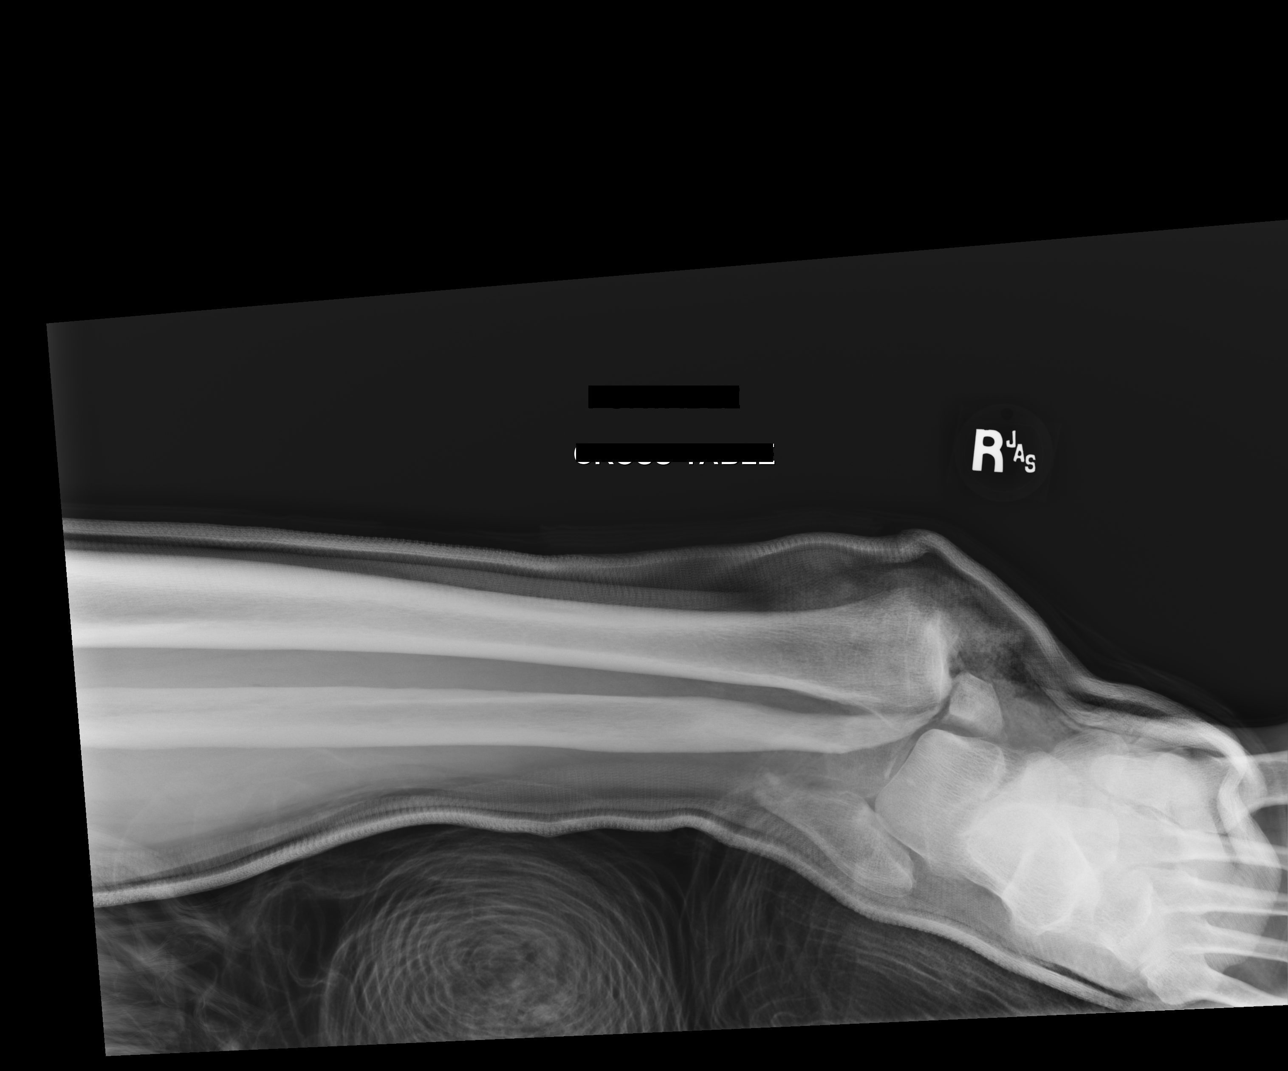

[AP (2 of 2)]
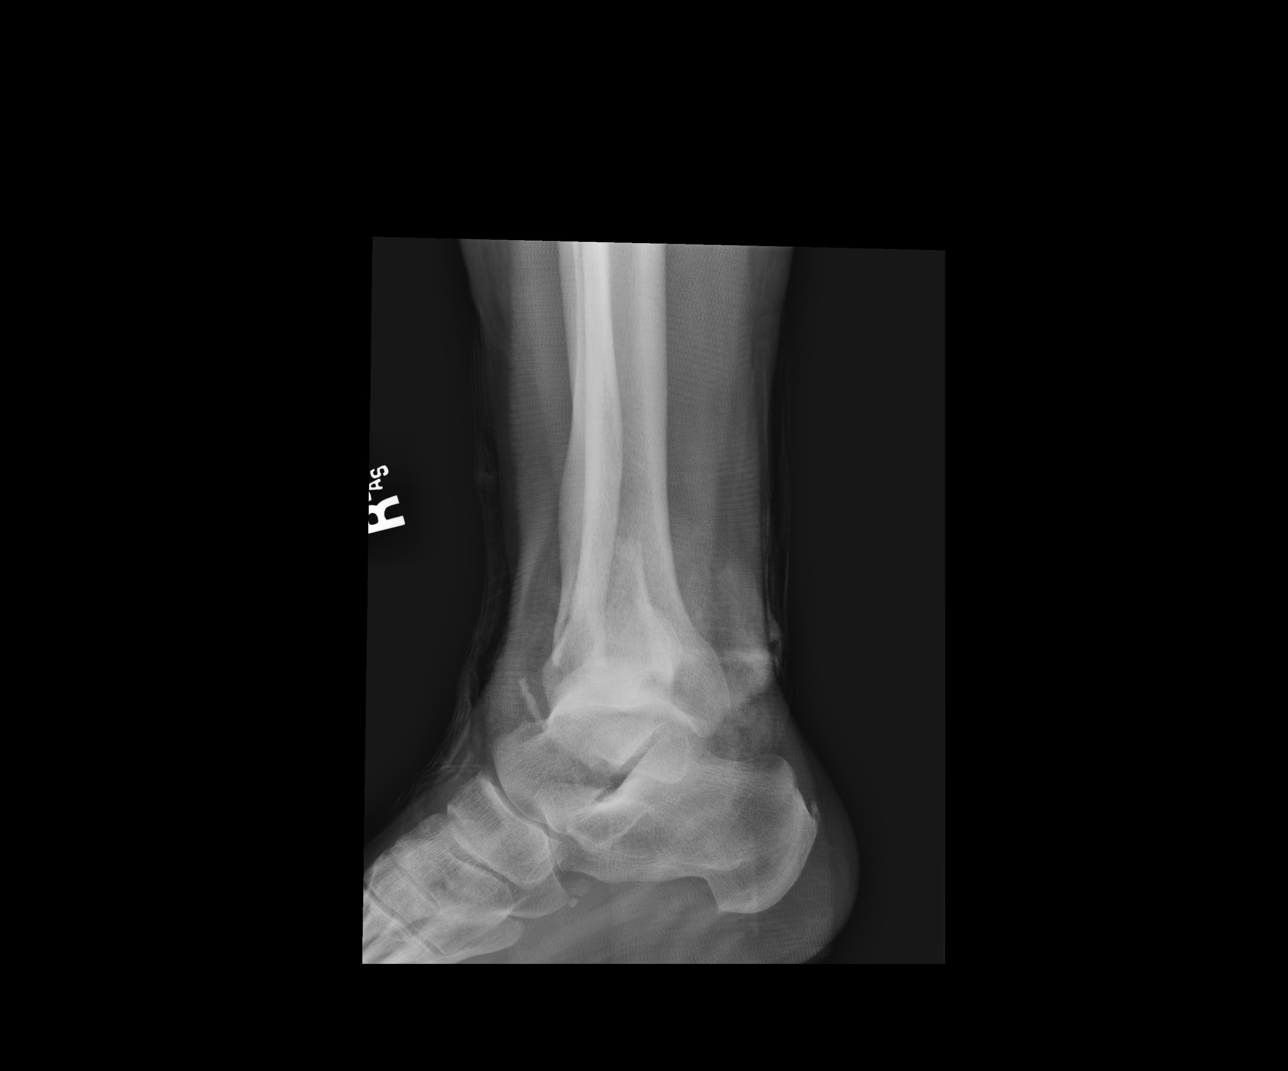

[4 of 4 positions shown; findings below may reference images not displayed]

FINDINGS: There is an acute, closed, bimalleolar and likely trimalleolar
fracture-dislocation at the ankle joint with the talar dome
laterally dislocated relative to the tibial plafond as well as 90
degree laterally rotated. No dislocation of the knee joint.
Calcaneal enthesophytes are present along the plantar dorsal aspect.
No apparent calcaneal fracture. Midfoot articulations and subtalar
joints are grossly intact. There is an accessory ossicle adjacent to
cuboid.
IMPRESSION: There is an acute, closed, bimalleolar and likely trimalleolar
fracture-dislocation at the ankle joint with the talar dome
laterally dislocated relative to the tibial plafond as well as 90
degree externally rotated.

## 2019-10-08 ENCOUNTER — Other Ambulatory Visit: Payer: Self-pay | Admitting: Orthopedic Surgery

## 2019-10-08 DIAGNOSIS — S82891B Other fracture of right lower leg, initial encounter for open fracture type I or II: Secondary | ICD-10-CM

## 2019-10-23 ENCOUNTER — Other Ambulatory Visit (HOSPITAL_COMMUNITY)
Admission: RE | Admit: 2019-10-23 | Discharge: 2019-10-23 | Disposition: A | Discharge status: Home / Self Care | Payer: Medicare Other | Source: Ambulatory Visit | Attending: Internal Medicine | Admitting: Internal Medicine

## 2019-10-23 DIAGNOSIS — X58XXXD Exposure to other specified factors, subsequent encounter: Secondary | ICD-10-CM | POA: Diagnosis not present

## 2019-10-23 DIAGNOSIS — S90521D Blister (nonthermal), right ankle, subsequent encounter: Secondary | ICD-10-CM | POA: Diagnosis present

## 2019-10-23 LAB — CBC WITH DIFFERENTIAL/PLATELET
Abs Immature Granulocytes: 0.01 10*3/uL (ref 0.00–0.07)
Basophils Absolute: 0 10*3/uL (ref 0.0–0.1)
Basophils Relative: 0 %
Eosinophils Absolute: 0.1 10*3/uL (ref 0.0–0.5)
Eosinophils Relative: 1 %
HCT: 47.9 % (ref 39.0–52.0)
Hemoglobin: 15.8 g/dL (ref 13.0–17.0)
Immature Granulocytes: 0 %
Lymphocytes Relative: 31 %
Lymphs Abs: 1.9 10*3/uL (ref 0.7–4.0)
MCH: 34.1 pg — ABNORMAL HIGH (ref 26.0–34.0)
MCHC: 33 g/dL (ref 30.0–36.0)
MCV: 103.5 fL — ABNORMAL HIGH (ref 80.0–100.0)
Monocytes Absolute: 0.6 10*3/uL (ref 0.1–1.0)
Monocytes Relative: 9 %
Neutro Abs: 3.7 10*3/uL (ref 1.7–7.7)
Neutrophils Relative %: 59 %
Platelets: 195 10*3/uL (ref 150–400)
RBC: 4.63 MIL/uL (ref 4.22–5.81)
RDW: 12 % (ref 11.5–15.5)
WBC: 6.4 10*3/uL (ref 4.0–10.5)
nRBC: 0 % (ref 0.0–0.2)

## 2019-10-23 LAB — SEDIMENTATION RATE: Sed Rate: 2 mm/hr (ref 0–16)

## 2019-10-30 ENCOUNTER — Other Ambulatory Visit: Payer: Self-pay

## 2019-10-30 ENCOUNTER — Ambulatory Visit
Admission: RE | Admit: 2019-10-30 | Discharge: 2019-10-30 | Disposition: A | Discharge status: Home / Self Care | Payer: Medicare Other | Source: Ambulatory Visit | Attending: Orthopedic Surgery | Admitting: Orthopedic Surgery

## 2019-10-30 DIAGNOSIS — S82891B Other fracture of right lower leg, initial encounter for open fracture type I or II: Secondary | ICD-10-CM

## 2019-10-30 MED ORDER — GADOBENATE DIMEGLUMINE 529 MG/ML IV SOLN
17.0000 mL | Freq: Once | INTRAVENOUS | Status: AC | PRN
  Administered 2019-10-30: 17 mL via INTRAVENOUS

## 2019-12-11 ENCOUNTER — Other Ambulatory Visit: Payer: Self-pay

## 2019-12-11 ENCOUNTER — Encounter: Payer: Self-pay | Admitting: Infectious Disease

## 2019-12-11 ENCOUNTER — Ambulatory Visit (INDEPENDENT_AMBULATORY_CARE_PROVIDER_SITE_OTHER): Payer: Medicare Other | Admitting: Infectious Disease

## 2019-12-11 VITALS — BP 132/78 | HR 98

## 2019-12-11 DIAGNOSIS — Z96641 Presence of right artificial hip joint: Secondary | ICD-10-CM

## 2019-12-11 DIAGNOSIS — S72001A Fracture of unspecified part of neck of right femur, initial encounter for closed fracture: Secondary | ICD-10-CM | POA: Diagnosis not present

## 2019-12-11 DIAGNOSIS — S82891B Other fracture of right lower leg, initial encounter for open fracture type I or II: Secondary | ICD-10-CM

## 2019-12-11 DIAGNOSIS — S82841J Displaced bimalleolar fracture of right lower leg, subsequent encounter for open fracture type IIIA, IIIB, or IIIC with delayed healing: Secondary | ICD-10-CM | POA: Diagnosis not present

## 2019-12-11 DIAGNOSIS — F0281 Dementia in other diseases classified elsewhere with behavioral disturbance: Secondary | ICD-10-CM

## 2019-12-11 DIAGNOSIS — M86662 Other chronic osteomyelitis, left tibia and fibula: Secondary | ICD-10-CM

## 2019-12-11 DIAGNOSIS — G309 Alzheimer's disease, unspecified: Secondary | ICD-10-CM | POA: Insufficient documentation

## 2019-12-11 DIAGNOSIS — G301 Alzheimer's disease with late onset: Secondary | ICD-10-CM

## 2019-12-11 DIAGNOSIS — F02818 Dementia in other diseases classified elsewhere, unspecified severity, with other behavioral disturbance: Secondary | ICD-10-CM

## 2019-12-11 HISTORY — DX: Dementia in other diseases classified elsewhere, unspecified severity, with other behavioral disturbance: F02.818

## 2019-12-11 HISTORY — DX: Dementia in other diseases classified elsewhere with behavioral disturbance: F02.81

## 2019-12-11 HISTORY — DX: Alzheimer's disease, unspecified: G30.9

## 2019-12-11 MED ORDER — AMOXICILLIN-POT CLAVULANATE 875-125 MG PO TABS: 1.0000 | ORAL_TABLET | Freq: Two times a day (BID) | ORAL | 11 refills | Status: DC

## 2019-12-11 NOTE — Progress Notes (Signed)
Subjective:    Reason for consult: Osteomyelitis of the tibia:  Requesting physician Dr. Altamese Owsley   Patient ID: Austin Dillon, male    DOB: 26-Jul-1951, 69 y.o.   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  He is currently cared for by his wife and also his son who assists in his care at nighttime.  Austin Dillon apparently had a right trimalleolar fracture with dislocation and underwent external fixation with I&D and ORIF by Dr. Sharol Given in April 2018.  Wound to his wife this patient has had purulent drainage from his wound in the past and was treated with doxycycline by Dr. Sharol Given.  He had an MRI of the area done in November which read by the radiologist as showing osteoarthritis s.  He has had increasing pain and been unable to walk on this area.  Increasing edema and some purulent drainage as well.  Has been followed by his primary care physician Dr. Wynetta Emery with [facility] who has prescribed doxycycline at times.  Patient was referred to Dr. Marcelino Scot who saw the patient per Dr. Carlean Jews note there is some x-rays obtained in his office that showed what appeared to be a trimalleolar ankle fracture with severe erosion of the tibiotalar joint with a cystic lesion in the central aspect of distal tibia which concerned him for osteomyelitis.  He also reexamined the MRI film in November and felt that this was also consistent with bone destruction of nearly 2 cm of the talus within the tibia.  My understanding is another MRI was obtained in the interim but I do not see the results.  He was brought back to the office and Dr. Marcelino Scot was able to aspirate the ankle joint but not able to obtain purulent material he did inject saline and this was then sent for culture which subsequently grew a methicillin sensitive Staph aureus species.  The organism was sensitive to all antibiotics tested including oxacillin tetracycline  Bactrim vancomycin and clindamycin and fluoroquinolones I would not want to use them.  Past Medical History:  Diagnosis Date  . Alzheimer disease ([place])   . Alzheimer's dementia with behavioral disturbance ([place]) 12/11/2019  . Headache     Past Surgical History:  Procedure Laterality Date  . APPENDECTOMY    . EXTERNAL FIXATION LEG Right 03/26/2017   Procedure: EXTERNAL FIXATION RIGHT LEG;  Surgeon: Mcarthur Rossetti, MD;  Location: [place];  Service: Orthopedics;  Laterality: Right;  . ORIF ANKLE FRACTURE Right 03/29/2017   Procedure: OPEN REDUCTION INTERNAL FIXATION (ORIF) RIGHT  ANKLE FRACTURE, Removal of External Fixator;  Surgeon: Newt Minion, MD;  Location: [place];  Service: Orthopedics;  Laterality: Right;  . TOTAL HIP ARTHROPLASTY Right 01/13/2018   Procedure: RIGHT ANTERIOR TOTAL HIP REPLACEMENT;  Surgeon: Mcarthur Rossetti, MD;  Location: [place];  Service: Orthopedics;  Laterality: Right;    No family history on file.    Social History   Socioeconomic History  . Marital status: Married    Spouse name: Not on file  . Number of children: Not on file  . Years of education: Not on file  . Highest education level: Not on file  Occupational History  . Not on file  Tobacco Use  . Smoking status: Current Every Day Smoker    Packs/day: 0.50    Years: 25.00    Pack years: 12.50    Types: Cigarettes  . Smokeless  tobacco: Never Used  Substance and Sexual Activity  . Alcohol use: Yes    Comment: SOCIAL  . Drug use: No  . Sexual activity: Not on file  Other Topics Concern  . Not on file  Social History Narrative  . Not on file   Social Determinants of Health   Financial Resource Strain:   . Difficulty of Paying Living Expenses: Not on file  Food Insecurity:   . Worried About Charity fundraiser in the Last Year: Not on file  . Ran Out of Food in the Last Year: Not on file  Transportation Needs:   . Lack of Transportation (Medical): Not on file  . Lack of  Transportation (Non-Medical): Not on file  Physical Activity:   . Days of Exercise per Week: Not on file  . Minutes of Exercise per Session: Not on file  Stress:   . Feeling of Stress : Not on file  Social Connections:   . Frequency of Communication with Friends and Family: Not on file  . Frequency of Social Gatherings with Friends and Family: Not on file  . Attends Religious Services: Not on file  . Active Member of Clubs or Organizations: Not on file  . Attends Archivist Meetings: Not on file  . Marital Status: Not on file    Allergies  Allergen Reactions  . Nortriptyline Other (See Comments)    Severe mood swings  . Aricept [Donepezil] Other (See Comments)    Makes the patient look like a "mummy" (too sedating)     Current Outpatient Medications:  .  OLANZapine (ZYPREXA) 2.5 MG tablet, Take 2.5 mg nightly for one week then increase to 2.5 mg two times a day (every 12 hours), Disp: , Rfl:  .  alprazolam (XANAX) 2 MG tablet, Take 2 mg by mouth 3 (three) times daily. , Disp: , Rfl:  .  amoxicillin-clavulanate (AUGMENTIN) 875-125 MG tablet, Take 1 tablet by mouth 2 (two) times daily., Disp: 60 tablet, Rfl: 11 .  aspirin EC 81 MG tablet, Take 1 tablet (81 mg total) by mouth 2 (two) times daily., Disp: 60 tablet, Rfl: 0 .  butalbital-acetaminophen-caffeine (FIORICET, ESGIC) 50-325-40 MG tablet, Take 1 tablet by mouth daily as needed for headache., Disp: , Rfl:  .  Cholecalciferol (VITAMIN D-3) 1000 units CAPS, Take 1,000 Units by mouth daily., Disp: , Rfl:  .  DULoxetine (CYMBALTA) 20 MG capsule, Take 20 mg by mouth daily., Disp: , Rfl:  .  feeding supplement, ENSURE ENLIVE, (ENSURE ENLIVE) LIQD, Take 237 mLs by mouth daily at 3 pm., Disp: 237 mL, Rfl: 12 .  ferrous sulfate 325 (65 FE) MG tablet, Take 1 tablet (325 mg total) by mouth 3 (three) times daily with meals for 20 days., Disp: 60 tablet, Rfl: 0 .  lamoTRIgine (LAMICTAL) 100 MG tablet, Take 100-150 mg by mouth See  admin instructions. 100 mg by mouth in the morning and 150 mg in the evening, Disp: , Rfl:  .  memantine (NAMENDA) 10 MG tablet, Take 10 mg by mouth 2 (two) times daily., Disp: , Rfl:  .  methocarbamol (ROBAXIN) 500 MG tablet, Take 1 tablet (500 mg total) by mouth every 6 (six) hours as needed for muscle spasms., Disp: 30 tablet, Rfl: 0 .  oxyCODONE-acetaminophen (ROXICET) 5-325 MG tablet, Take 1-2 tablets by mouth every 6 (six) hours as needed for severe pain., Disp: 60 tablet, Rfl: 0 .  senna-docusate (SENOKOT-S) 8.6-50 MG tablet, Take 1 tablet by mouth at  bedtime as needed for mild constipation or moderate constipation., Disp: 30 tablet, Rfl: 0 .  tamsulosin (FLOMAX) 0.4 MG CAPS capsule, Take 0.4 mg by mouth at bedtime., Disp: , Rfl:    Review of Systems  Unable to perform ROS: Dementia       Objective:   Physical Exam Constitutional:      General: He is not in acute distress. HENT:     Head: Normocephalic and atraumatic.  Eyes:     Extraocular Movements: Extraocular movements intact.  Cardiovascular:     Rate and Rhythm: Normal rate and regular rhythm.  Pulmonary:     Effort: Pulmonary effort is normal. No respiratory distress.  Abdominal:     General: Abdomen is flat. There is no distension.  Musculoskeletal:        General: Swelling and deformity present.  Skin:    Findings: Erythema present.  Neurological:     General: No focal deficit present.     Mental Status: He is alert.  Psychiatric:        Attention and Perception: He is inattentive.        Mood and Affect: Affect is flat.        Speech: Speech is delayed.        Behavior: Behavior is cooperative.        Cognition and Memory: Cognition is impaired. Memory is impaired. He exhibits impaired recent memory and impaired remote memory.    Right lower extremity December 11, 2019:           Assessment & Plan:   Osteomyelitis of the tibia:  To achieve cure he would need surgery and undoubtedly a below the  knee amputation.  I do not see a role for intravenous antibiotics here.  They will not help him achieve a cure of this infection and I think trying to give antibiotics at home with the PICC line in a patient with dementia and psychosis is highly problematic.  I will initiate Augmentin twice daily which would cover his methicillin sensitive staph aureus would also provide coverage for streptococcal species and offer reasonable bioavailability.  Dementia: Patient is becoming increasingly difficult for his wife to take care of.  She is having to lift him by herself frequently.  Her son is going to stay with them and not return home but help at night when the wife leaves for her own safety.  They apparently been working with a neurologist about potentially placement in a skilled nursing facility.  I offered admission to the hospital to help facilitate care of his infection in addition to his other needs for skilled nursing.  The patient himself was not amenable to this.  I plan on seeing him back in a few weeks and then can represent the option of admission to the hospital.  I spent greater than 60 minutes with the patient including greater than 50% of time in face to face counsel of the patient and his wife , reviewing records and in coordination of his care.

## 2019-12-12 LAB — CBC WITH DIFFERENTIAL/PLATELET
Absolute Monocytes: 1012 cells/uL — ABNORMAL HIGH (ref 200–950)
Basophils Absolute: 35 cells/uL (ref 0–200)
Basophils Relative: 0.3 %
Eosinophils Absolute: 46 cells/uL (ref 15–500)
Eosinophils Relative: 0.4 %
HCT: 43.3 % (ref 38.5–50.0)
Hemoglobin: 14.8 g/dL (ref 13.2–17.1)
Lymphs Abs: 1530 cells/uL (ref 850–3900)
MCH: 33.6 pg — ABNORMAL HIGH (ref 27.0–33.0)
MCHC: 34.2 g/dL (ref 32.0–36.0)
MCV: 98.2 fL (ref 80.0–100.0)
MPV: 10.7 fL (ref 7.5–12.5)
Monocytes Relative: 8.8 %
Neutro Abs: 8878 cells/uL — ABNORMAL HIGH (ref 1500–7800)
Neutrophils Relative %: 77.2 %
Platelets: 317 10*3/uL (ref 140–400)
RBC: 4.41 10*6/uL (ref 4.20–5.80)
RDW: 11.4 % (ref 11.0–15.0)
Total Lymphocyte: 13.3 %
WBC: 11.5 10*3/uL — ABNORMAL HIGH (ref 3.8–10.8)

## 2019-12-12 LAB — BASIC METABOLIC PANEL WITH GFR
BUN: 10 mg/dL (ref 7–25)
CO2: 25 mmol/L (ref 20–32)
Calcium: 9.6 mg/dL (ref 8.6–10.3)
Chloride: 104 mmol/L (ref 98–110)
Creat: 1.19 mg/dL (ref 0.70–1.25)
GFR, Est African American: 72 mL/min/{1.73_m2} (ref >=60)
GFR, Est Non African American: 62 mL/min/{1.73_m2} (ref >=60)
Glucose, Bld: 110 mg/dL — ABNORMAL HIGH (ref 65–99)
Potassium: 4.8 mmol/L (ref 3.5–5.3)
Sodium: 138 mmol/L (ref 135–146)

## 2019-12-12 LAB — SEDIMENTATION RATE: Sed Rate: 53 mm/h — ABNORMAL HIGH (ref 0–20)

## 2019-12-12 LAB — C-REACTIVE PROTEIN: CRP: 150.9 mg/L — ABNORMAL HIGH (ref <8.0)

## 2019-12-24 ENCOUNTER — Ambulatory Visit: Payer: Medicare Other | Admitting: Infectious Disease

## 2019-12-25 ENCOUNTER — Other Ambulatory Visit: Payer: Self-pay

## 2019-12-25 ENCOUNTER — Encounter: Payer: Self-pay | Admitting: Infectious Disease

## 2019-12-25 ENCOUNTER — Ambulatory Visit (INDEPENDENT_AMBULATORY_CARE_PROVIDER_SITE_OTHER): Payer: Medicare Other | Admitting: Infectious Disease

## 2019-12-25 VITALS — BP 139/84 | HR 86 | Temp 97.8°F | Ht 68.0 in | Wt 186.0 lb

## 2019-12-25 DIAGNOSIS — G301 Alzheimer's disease with late onset: Secondary | ICD-10-CM | POA: Diagnosis not present

## 2019-12-25 DIAGNOSIS — A4901 Methicillin susceptible Staphylococcus aureus infection, unspecified site: Secondary | ICD-10-CM | POA: Diagnosis not present

## 2019-12-25 DIAGNOSIS — M868X6 Other osteomyelitis, lower leg: Secondary | ICD-10-CM

## 2019-12-25 DIAGNOSIS — F02818 Dementia in other diseases classified elsewhere, unspecified severity, with other behavioral disturbance: Secondary | ICD-10-CM

## 2019-12-25 DIAGNOSIS — F0281 Dementia in other diseases classified elsewhere with behavioral disturbance: Secondary | ICD-10-CM | POA: Diagnosis not present

## 2019-12-25 DIAGNOSIS — M869 Osteomyelitis, unspecified: Secondary | ICD-10-CM

## 2019-12-25 HISTORY — DX: Methicillin susceptible Staphylococcus aureus infection, unspecified site: A49.01

## 2019-12-25 HISTORY — DX: Osteomyelitis, unspecified: M86.9

## 2019-12-25 NOTE — Progress Notes (Signed)
Subjective:    Chief complaint: Pain in right ankle   Patient ID: Austin Dillon, male    DOB: October 11, 1951, 69 y.o.   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  He is currently cared for by his wife and also his son who assists in his care at nighttime.  Austin Dillon apparently had a right trimalleolar fracture with dislocation and underwent external fixation with I&D and ORIF by Dr. Sharol Given in April 2018.  Wound to his wife this patient has had purulent drainage from his wound in the past and was treated with doxycycline by Dr. Sharol Given.  He had an MRI of the area done in November which read by the radiologist as showing osteoarthritis s.  He  had increasing pain and been unable to walk on this area.  Increasing edema and some purulent drainage as well.  Has been followed by his primary care physician Dr. Wynetta Emery with [facility] who has prescribed doxycycline at times.  Patient was referred to Dr. Marcelino Scot who saw the patient per Dr. Carlean Jews note there is some x-rays obtained in his office that showed what appeared to be a trimalleolar ankle fracture with severe erosion of the tibiotalar joint with a cystic lesion in the central aspect of distal tibia which concerned him for osteomyelitis.  He also reexamined the MRI film in November and felt that this was also consistent with bone destruction of nearly 2 cm of the talus within the tibia.  My understanding is another MRI was obtained in the interim but I do not see the results.  He was brought back to the office and Dr. Marcelino Scot was able to aspirate the ankle joint but not able to obtain purulent material he did inject saline and this was then sent for culture which subsequently grew a methicillin sensitive Staph aureus species.  The organism was sensitive to all antibiotics tested including oxacillin tetracycline Bactrim vancomycin and clindamycin and fluoroquinolones  I would not want to use them.  I saw him in clinic roughly 2 weeks ago and started on oral Augmentin.  At the time I thought that the only way for him to achieve cure was to have a below the knee amputation which I still think is likely the only realistic option for cure.  Since starting the Augmentin there may have been a diminishment in his edema in his leg.  Certainly appears to be less erythematous versus the last time I saw him  He still has significant pain and difficulty walking here.  His wife accompanies him mention the idea of surgery with Dr. Marcelino Scot with placement with antibiotic beads.  I promised her that would get in touch with Dr. Marcelino Scot after the visit which I did do so see below.    Past Medical History:  Diagnosis Date  . Alzheimer disease ([place])   . Alzheimer's dementia with behavioral disturbance ([place]) 12/11/2019  . Headache     Past Surgical History:  Procedure Laterality Date  . APPENDECTOMY    . EXTERNAL FIXATION LEG Right 03/26/2017   Procedure: EXTERNAL FIXATION RIGHT LEG;  Surgeon: Mcarthur Rossetti, MD;  Location: [facility];  Service: Orthopedics;  Laterality: Right;  . ORIF ANKLE FRACTURE Right 03/29/2017   Procedure: OPEN REDUCTION INTERNAL FIXATION (ORIF) RIGHT  ANKLE FRACTURE, Removal of External Fixator;  Surgeon: Newt Minion, MD;  Location: [place];  Service: Orthopedics;  Laterality: Right;  .  TOTAL HIP ARTHROPLASTY Right 01/13/2018   Procedure: RIGHT ANTERIOR TOTAL HIP REPLACEMENT;  Surgeon: Mcarthur Rossetti, MD;  Location: [place];  Service: Orthopedics;  Laterality: Right;    No family history on file.    Social History   Socioeconomic History  . Marital status: Married    Spouse name: Not on file  . Number of children: Not on file  . Years of education: Not on file  . Highest education level: Not on file  Occupational History  . Not on file  Tobacco Use  . Smoking status: Current Every Day Smoker    Packs/day: 0.50    Years: 25.00      Pack years: 12.50    Types: Cigarettes  . Smokeless tobacco: Never Used  Substance and Sexual Activity  . Alcohol use: Yes    Comment: SOCIAL  . Drug use: No  . Sexual activity: Not on file  Other Topics Concern  . Not on file  Social History Narrative  . Not on file   Social Determinants of Health   Financial Resource Strain:   . Difficulty of Paying Living Expenses: Not on file  Food Insecurity:   . Worried About Charity fundraiser in the Last Year: Not on file  . Ran Out of Food in the Last Year: Not on file  Transportation Needs:   . Lack of Transportation (Medical): Not on file  . Lack of Transportation (Non-Medical): Not on file  Physical Activity:   . Days of Exercise per Week: Not on file  . Minutes of Exercise per Session: Not on file  Stress:   . Feeling of Stress : Not on file  Social Connections:   . Frequency of Communication with Friends and Family: Not on file  . Frequency of Social Gatherings with Friends and Family: Not on file  . Attends Religious Services: Not on file  . Active Member of Clubs or Organizations: Not on file  . Attends Archivist Meetings: Not on file  . Marital Status: Not on file    Allergies  Allergen Reactions  . Nortriptyline Other (See Comments)    Severe mood swings  . Aricept [Donepezil] Other (See Comments)    Makes the patient look like a "mummy" (too sedating)     Current Outpatient Medications:  .  alprazolam (XANAX) 2 MG tablet, Take 2 mg by mouth 3 (three) times daily. , Disp: , Rfl:  .  amoxicillin-clavulanate (AUGMENTIN) 875-125 MG tablet, Take 1 tablet by mouth 2 (two) times daily., Disp: 60 tablet, Rfl: 11 .  aspirin EC 81 MG tablet, Take 1 tablet (81 mg total) by mouth 2 (two) times daily., Disp: 60 tablet, Rfl: 0 .  butalbital-acetaminophen-caffeine (FIORICET, ESGIC) 50-325-40 MG tablet, Take 1 tablet by mouth daily as needed for headache., Disp: , Rfl:  .  Cholecalciferol (VITAMIN D-3) 1000 units  CAPS, Take 1,000 Units by mouth daily., Disp: , Rfl:  .  DULoxetine (CYMBALTA) 20 MG capsule, Take 20 mg by mouth daily., Disp: , Rfl:  .  feeding supplement, ENSURE ENLIVE, (ENSURE ENLIVE) LIQD, Take 237 mLs by mouth daily at 3 pm., Disp: 237 mL, Rfl: 12 .  ferrous sulfate 325 (65 FE) MG tablet, Take 1 tablet (325 mg total) by mouth 3 (three) times daily with meals for 20 days., Disp: 60 tablet, Rfl: 0 .  lamoTRIgine (LAMICTAL) 100 MG tablet, Take 100-150 mg by mouth See admin instructions. 100 mg by mouth in the morning and  150 mg in the evening, Disp: , Rfl:  .  memantine (NAMENDA) 10 MG tablet, Take 10 mg by mouth 2 (two) times daily., Disp: , Rfl:  .  methocarbamol (ROBAXIN) 500 MG tablet, Take 1 tablet (500 mg total) by mouth every 6 (six) hours as needed for muscle spasms., Disp: 30 tablet, Rfl: 0 .  OLANZapine (ZYPREXA) 2.5 MG tablet, Take 2.5 mg nightly for one week then increase to 2.5 mg two times a day (every 12 hours), Disp: , Rfl:  .  oxyCODONE-acetaminophen (ROXICET) 5-325 MG tablet, Take 1-2 tablets by mouth every 6 (six) hours as needed for severe pain., Disp: 60 tablet, Rfl: 0 .  senna-docusate (SENOKOT-S) 8.6-50 MG tablet, Take 1 tablet by mouth at bedtime as needed for mild constipation or moderate constipation., Disp: 30 tablet, Rfl: 0 .  tamsulosin (FLOMAX) 0.4 MG CAPS capsule, Take 0.4 mg by mouth at bedtime., Disp: , Rfl:    Review of Systems  Unable to perform ROS: Dementia       Objective:   Physical Exam Constitutional:      General: He is not in acute distress. HENT:     Head: Normocephalic and atraumatic.  Eyes:     Extraocular Movements: Extraocular movements intact.  Cardiovascular:     Rate and Rhythm: Normal rate and regular rhythm.  Pulmonary:     Effort: Pulmonary effort is normal. No respiratory distress.  Abdominal:     General: Abdomen is flat. There is no distension.  Musculoskeletal:        General: Swelling and deformity present.  Skin:     Findings: Erythema present.  Neurological:     General: No focal deficit present.     Mental Status: He is alert.  Psychiatric:        Attention and Perception: He is inattentive.        Mood and Affect: Affect is flat.        Speech: Speech is delayed.        Behavior: Behavior is cooperative.        Cognition and Memory: Cognition is impaired. Memory is impaired. He exhibits impaired recent memory and impaired remote memory.    Right lower extremity December 11, 2019:      Right ankle December 25, 2019:         Assessment & Plan:   Osteomyelitis of the tibia:  I continue to believe that the only option to realistically achieve cure he would need surgery and undoubtedly a below the knee amputation.:  I wonder in that situation whether or not with his dementia if he would be able to learn how to walk with a prosthetic leg.  I am, skeptical that he would be able to do so  After his visit I did have a talk with Dr. Marcelino Scot who told me that he had offered the patient a below the knee amputation as a curative surgery.  He had also offered an aggressive intervention of debridement and resection of bone and placement of antibiotic spacer followed by protracted antibiotics though he thought this would likely be a long and protracted ferritin be unlikely to achieve cure.  The third option is to try indefinite oral antibiotics to suppress his infection in his ankle and prevent him from becoming septic.  Certainly this is not a full prove endeavor and many patients become septic despite being on appropriate antibiotics for suppressive therapy.    I do not see a role for intravenous antibiotics  here.  They will not help him achieve a cure of this infection and I think trying to give antibiotics at home with the PICC line in a patient with dementia and psychosis is highly problematic.  We will check inflammatory markers today and I will place the patient on Augmentin to continue this for  now.  Dementia: Patient is becoming increasingly difficult for his wife to take care of.  She is having to lift him by herself frequently.  Her son is going to stay with them and not return home but help at night when the wife leaves for her own safety.  They apparently been working with a neurologist about potentially placement in a skilled nursing facility.  I offered admission to the hospital at last visit to help facilitate care of his infection in addition to his other needs for skilled nursing.  The patient himself was not amenable to this then  I plan on seeing him back in a couple of months unless there is a new intervention in the interim.  I spent greater than 25 minutes with the patient including greater than 50% of time in face to face counsel of the patient and his wife regarding the nature of his infection and coordination of his care with Dr. Marcelino Scot.

## 2019-12-26 ENCOUNTER — Telehealth: Payer: Self-pay | Admitting: *Deleted

## 2019-12-26 LAB — CBC WITH DIFFERENTIAL/PLATELET
Absolute Monocytes: 414 cells/uL (ref 200–950)
Basophils Absolute: 41 cells/uL (ref 0–200)
Basophils Relative: 0.6 %
Eosinophils Absolute: 110 cells/uL (ref 15–500)
Eosinophils Relative: 1.6 %
HCT: 43.7 % (ref 38.5–50.0)
Hemoglobin: 14.8 g/dL (ref 13.2–17.1)
Lymphs Abs: 2118 cells/uL (ref 850–3900)
MCH: 33.1 pg — ABNORMAL HIGH (ref 27.0–33.0)
MCHC: 33.9 g/dL (ref 32.0–36.0)
MCV: 97.8 fL (ref 80.0–100.0)
MPV: 11 fL (ref 7.5–12.5)
Monocytes Relative: 6 %
Neutro Abs: 4216 cells/uL (ref 1500–7800)
Neutrophils Relative %: 61.1 %
Platelets: 247 10*3/uL (ref 140–400)
RBC: 4.47 10*6/uL (ref 4.20–5.80)
RDW: 11.9 % (ref 11.0–15.0)
Total Lymphocyte: 30.7 %
WBC: 6.9 10*3/uL (ref 3.8–10.8)

## 2019-12-26 LAB — BASIC METABOLIC PANEL WITH GFR
BUN: 15 mg/dL (ref 7–25)
CO2: 28 mmol/L (ref 20–32)
Calcium: 9.4 mg/dL (ref 8.6–10.3)
Chloride: 106 mmol/L (ref 98–110)
Creat: 1.07 mg/dL (ref 0.70–1.25)
GFR, Est African American: 82 mL/min/{1.73_m2} (ref >=60)
GFR, Est Non African American: 71 mL/min/{1.73_m2} (ref >=60)
Glucose, Bld: 110 mg/dL — ABNORMAL HIGH (ref 65–99)
Potassium: 4.5 mmol/L (ref 3.5–5.3)
Sodium: 141 mmol/L (ref 135–146)

## 2019-12-26 LAB — C-REACTIVE PROTEIN: CRP: 1.1 mg/L (ref <8.0)

## 2019-12-26 LAB — SEDIMENTATION RATE: Sed Rate: 19 mm/h (ref 0–20)

## 2019-12-26 NOTE — Telephone Encounter (Signed)
Pt requesting to send the last note to from Dr. Tommy Medal to Dr. Clementeen Hoof). Sent/faxed notes [phone].

## 2020-01-10 ENCOUNTER — Telehealth: Payer: Self-pay

## 2020-01-10 NOTE — Telephone Encounter (Signed)
Received phone call from patient's wife, Austin Dillon stating that after seeing Dr. Tommy Medal last month that they were to receive a phone call going over treatment options after Dr. Tommy Medal spoke with Dr. Marcelino Scot. Mrs. Wickland states that no one has called and is unsure of what the next step is for her husband. Requested a call back with updates.   Stephan Nelis Lorita Officer, RN

## 2020-01-10 NOTE — Telephone Encounter (Signed)
I think the only realistic option to cure this patient is a amputation.  Dr. Marcelino Scot did mention another option of doing I&D and placing antibiotic beads this will be a protracted affair though and I think unlikely to achieve a solution.  Dr. Marcelino Scot also believes the best option for cure is an amputation.  If they are interested in exploring surgical options they should make another appointment with Dr. Marcelino Scot who is happy to see the patient whenever they would like to see him.  As an aside Dr. Marcelino Scot told me that both he and multiple surgical providers have been having the same types of conversations with his couple for several years.

## 2020-01-10 NOTE — Telephone Encounter (Signed)
Relayed messaged from Dr. Tommy Medal to patient's wife, Mardene Celeste stating that from an infectious disease standpoint that there isn't much to be done, that surgical intervention would be the only realistic curative option. The suggested route would be to contact Dr. Marcelino Scot for surgical options if they're interested. Ms. Szostek verbalized understanding and requested Dr. Carlean Jews number. Stated she would call him to discuss.   Iyanna Drummer Lorita Officer, RN

## 2020-01-13 NOTE — Telephone Encounter (Signed)
Thanks Dorie and it is not unreasonable to keep going on oral suppressive antibiotics but that wont cure this

## 2020-02-03 ENCOUNTER — Other Ambulatory Visit: Payer: Self-pay

## 2020-02-03 ENCOUNTER — Other Ambulatory Visit
Admission: RE | Admit: 2020-02-03 | Discharge: 2020-02-03 | Disposition: A | Discharge status: Home / Self Care | Payer: Medicare Other | Source: Ambulatory Visit | Attending: Orthopedic Surgery | Admitting: Orthopedic Surgery

## 2020-02-03 ENCOUNTER — Encounter (HOSPITAL_COMMUNITY): Payer: Self-pay | Admitting: Orthopedic Surgery

## 2020-02-03 DIAGNOSIS — Z20822 Contact with and (suspected) exposure to covid-19: Secondary | ICD-10-CM | POA: Diagnosis not present

## 2020-02-03 DIAGNOSIS — Z01812 Encounter for preprocedural laboratory examination: Secondary | ICD-10-CM | POA: Insufficient documentation

## 2020-02-03 NOTE — Anesthesia Preprocedure Evaluation (Addendum)
Anesthesia Evaluation  Patient identified by MRN, date of birth, ID band Patient awake    Reviewed: Allergy & Precautions, NPO status , Patient's Chart, lab work & pertinent test results  Airway Mallampati: III  TM Distance: >3 FB Neck ROM: Full    Dental  (+) Dental Advisory Given, Poor Dentition, Missing, Chipped   Pulmonary Current Smoker and Patient abstained from smoking.,    Pulmonary exam normal breath sounds clear to auscultation       Cardiovascular negative cardio ROS Normal cardiovascular exam Rhythm:Regular Rate:Normal     Neuro/Psych  Headaches, PSYCHIATRIC DISORDERS Anxiety Depression Dementia Alzheimer disease     GI/Hepatic negative GI ROS, Neg liver ROS,   Endo/Other  negative endocrine ROS  Renal/GU negative Renal ROS     Musculoskeletal  (+) Arthritis , Chronic septic arthritis right tibia   Abdominal   Peds  Hematology negative hematology ROS (+)   Anesthesia Other Findings Day of surgery medications reviewed with the patient.  Reproductive/Obstetrics                           Anesthesia Physical Anesthesia Plan  ASA: III  Anesthesia Plan: Regional and MAC   Post-op Pain Management:    Induction: Intravenous  PONV Risk Score and Plan: 0 and Propofol infusion and Treatment may vary due to age or medical condition  Airway Management Planned: Natural Airway and Simple Face Mask  Additional Equipment:   Intra-op Plan:   Post-operative Plan:   Informed Consent: I have reviewed the patients History and Physical, chart, labs and discussed the procedure including the risks, benefits and alternatives for the proposed anesthesia with the patient or authorized representative who has indicated his/her understanding and acceptance.     Dental advisory given  Plan Discussed with: CRNA  Anesthesia Plan Comments: (Seen by PCP Dr. Edwina Barth 01/27/20 for preop clearance.  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 His wife says they are planning on some type of amputation but needed preop clearance. Not sure if this is going to be below the knee but suspect it is. He has no history of coronary artery disease and has no recent chest pain or cardiac symptoms. He has been a heavy smoker and does get occasional shortness of breath. He also has a history of drinking alcohol heavily. Although he says he stopped drinking 2 weeks ago.Marland KitchenMarland KitchenPreop evaluation. Performed preop labs which are not yet resulted. Performed EKG which I interpret as normal sinus rhythm. Right axis deviation. Reviewed preop lab results which are normal. Patient is moderate risk for surgical procedure. Discussed risk with patient and family. They understand and want to proceed. Recommend proceed with surgery.  Hx of Alzheimer's dementia with psychotic episodes.   CBC and CMP from 01/27/20 in care everywhere reviewed, WNL.   EKG tracing requested. )      Anesthesia Quick Evaluation

## 2020-02-03 NOTE — Progress Notes (Signed)
Anesthesia Chart Review: Same day workup  Seen by PCP Dr. Edwina Barth 01/27/20 for preop clearance. 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 His wife says they are planning on some type of amputation but needed preop clearance. Not sure if this is going to be below the knee but suspect it is. He has no history of coronary artery disease and has no recent chest pain or cardiac symptoms. He has been a heavy smoker and does get occasional shortness of breath. He also has a history of drinking alcohol heavily. Although he says he stopped drinking 2 weeks ago.Marland KitchenMarland KitchenPreop evaluation. Performed preop labs which are not yet resulted. Performed EKG which I interpret as normal sinus rhythm. Right axis deviation. Reviewed preop lab results which are normal. Patient is moderate risk for surgical procedure. Discussed risk with patient and family. They understand and want to proceed. Recommend proceed with surgery.  Hx of Alzheimer's dementia with psychotic episodes.   CBC and CMP from 01/27/20 in care everywhere reviewed, WNL.   EKG tracing requested.

## 2020-02-03 NOTE — Progress Notes (Addendum)
Austin Dillon denies chest pain or shortness of breath, patient nor wife have any s/s of Covid, patient will be tested today.Patient asked me to speak to his wife Austin Dillon to complete interview.  I instructed Austin Dillon that patient should come home after test and quarantine with just the people he lives with. "Oh no , he was supposed to  have several visitors this evening. "  I explained to [name] the reason for quarantining. Austin Dillon asked if patient could just take a shower this evening, I instructed her that patient needs to have showered, or have a good wah up with antibacteria soap.  Austin Dillon was seen by PCP, Austin Dillon at [facility] 01/27/2020.  Patient had labs drawn, an EKG, and chest xray.  I have requested EKG tracing. Fax failed several times, I sent request to [place].

## 2020-02-04 ENCOUNTER — Inpatient Hospital Stay (HOSPITAL_COMMUNITY): Payer: Medicare Other

## 2020-02-04 ENCOUNTER — Encounter (HOSPITAL_COMMUNITY)
Admission: RE | Disposition: A | Discharge status: Home Health | Payer: Self-pay | Source: Home / Self Care | Attending: Orthopedic Surgery

## 2020-02-04 ENCOUNTER — Inpatient Hospital Stay (HOSPITAL_COMMUNITY): Payer: Medicare Other | Admitting: Physician Assistant

## 2020-02-04 ENCOUNTER — Encounter (HOSPITAL_COMMUNITY): Payer: Self-pay | Admitting: Orthopedic Surgery

## 2020-02-04 ENCOUNTER — Inpatient Hospital Stay (HOSPITAL_COMMUNITY)
Admission: RE | Admit: 2020-02-04 | Discharge: 2020-02-07 | DRG: 475 | Disposition: A | Discharge status: Home Health | Payer: Medicare Other | Attending: Orthopedic Surgery | Admitting: Orthopedic Surgery

## 2020-02-04 DIAGNOSIS — S82891S Other fracture of right lower leg, sequela: Secondary | ICD-10-CM

## 2020-02-04 DIAGNOSIS — Z79899 Other long term (current) drug therapy: Secondary | ICD-10-CM | POA: Diagnosis not present

## 2020-02-04 DIAGNOSIS — G309 Alzheimer's disease, unspecified: Secondary | ICD-10-CM | POA: Diagnosis present

## 2020-02-04 DIAGNOSIS — B9561 Methicillin susceptible Staphylococcus aureus infection as the cause of diseases classified elsewhere: Secondary | ICD-10-CM | POA: Diagnosis present

## 2020-02-04 DIAGNOSIS — F419 Anxiety disorder, unspecified: Secondary | ICD-10-CM | POA: Diagnosis present

## 2020-02-04 DIAGNOSIS — Z888 Allergy status to other drugs, medicaments and biological substances status: Secondary | ICD-10-CM | POA: Diagnosis not present

## 2020-02-04 DIAGNOSIS — M009 Pyogenic arthritis, unspecified: Principal | ICD-10-CM | POA: Diagnosis present

## 2020-02-04 DIAGNOSIS — S88119A Complete traumatic amputation at level between knee and ankle, unspecified lower leg, initial encounter: Secondary | ICD-10-CM

## 2020-02-04 DIAGNOSIS — F0281 Dementia in other diseases classified elsewhere with behavioral disturbance: Secondary | ICD-10-CM | POA: Diagnosis present

## 2020-02-04 DIAGNOSIS — M86661 Other chronic osteomyelitis, right tibia and fibula: Secondary | ICD-10-CM | POA: Diagnosis present

## 2020-02-04 DIAGNOSIS — R519 Headache, unspecified: Secondary | ICD-10-CM | POA: Diagnosis present

## 2020-02-04 DIAGNOSIS — Z7982 Long term (current) use of aspirin: Secondary | ICD-10-CM

## 2020-02-04 DIAGNOSIS — F4310 Post-traumatic stress disorder, unspecified: Secondary | ICD-10-CM | POA: Diagnosis present

## 2020-02-04 DIAGNOSIS — F418 Other specified anxiety disorders: Secondary | ICD-10-CM | POA: Diagnosis present

## 2020-02-04 DIAGNOSIS — E559 Vitamin D deficiency, unspecified: Secondary | ICD-10-CM | POA: Diagnosis present

## 2020-02-04 DIAGNOSIS — D62 Acute posthemorrhagic anemia: Secondary | ICD-10-CM | POA: Diagnosis present

## 2020-02-04 DIAGNOSIS — M19071 Primary osteoarthritis, right ankle and foot: Secondary | ICD-10-CM | POA: Diagnosis present

## 2020-02-04 DIAGNOSIS — F329 Major depressive disorder, single episode, unspecified: Secondary | ICD-10-CM | POA: Diagnosis present

## 2020-02-04 DIAGNOSIS — G8929 Other chronic pain: Secondary | ICD-10-CM | POA: Diagnosis present

## 2020-02-04 DIAGNOSIS — Z96641 Presence of right artificial hip joint: Secondary | ICD-10-CM | POA: Diagnosis present

## 2020-02-04 DIAGNOSIS — Z20822 Contact with and (suspected) exposure to covid-19: Secondary | ICD-10-CM | POA: Diagnosis present

## 2020-02-04 DIAGNOSIS — F0280 Dementia in other diseases classified elsewhere without behavioral disturbance: Secondary | ICD-10-CM | POA: Diagnosis present

## 2020-02-04 DIAGNOSIS — F17210 Nicotine dependence, cigarettes, uncomplicated: Secondary | ICD-10-CM | POA: Diagnosis present

## 2020-02-04 DIAGNOSIS — F17200 Nicotine dependence, unspecified, uncomplicated: Secondary | ICD-10-CM | POA: Diagnosis present

## 2020-02-04 HISTORY — DX: Post-traumatic stress disorder, unspecified: F43.10

## 2020-02-04 HISTORY — DX: Pyogenic arthritis, unspecified: M00.9

## 2020-02-04 HISTORY — DX: Personal history of other medical treatment: Z92.89

## 2020-02-04 HISTORY — DX: Unspecified osteoarthritis, unspecified site: M19.90

## 2020-02-04 HISTORY — PX: AMPUTATION: SHX166

## 2020-02-04 LAB — TYPE AND SCREEN
ABO/RH(D): O POS
Antibody Screen: NEGATIVE

## 2020-02-04 LAB — SARS CORONAVIRUS 2 (TAT 6-24 HRS): SARS Coronavirus 2: NEGATIVE

## 2020-02-04 LAB — APTT: aPTT: 32 seconds (ref 24–36)

## 2020-02-04 LAB — PROTIME-INR
INR: 1 (ref 0.8–1.2)
Prothrombin Time: 12.9 seconds (ref 11.4–15.2)

## 2020-02-04 SURGERY — AMPUTATION BELOW KNEE
Anesthesia: Monitor Anesthesia Care | Site: Knee | Laterality: Right

## 2020-02-04 MED ORDER — ROPIVACAINE HCL 5 MG/ML IJ SOLN
INTRAMUSCULAR | Status: DC | PRN
  Administered 2020-02-04: 30 mL via PERINEURAL
  Administered 2020-02-04: 10 mL via PERINEURAL

## 2020-02-04 MED ORDER — FENTANYL CITRATE (PF) 100 MCG/2ML IJ SOLN
INTRAMUSCULAR | Status: DC | PRN
  Administered 2020-02-04: 50 ug via INTRAVENOUS

## 2020-02-04 MED ORDER — ONDANSETRON HCL 4 MG/2ML IJ SOLN
INTRAMUSCULAR | Status: AC
  Filled 2020-02-04: qty 2

## 2020-02-04 MED ORDER — ONDANSETRON HCL 4 MG/2ML IJ SOLN: 4.0000 mg | Freq: Once | INTRAMUSCULAR | Status: DC | PRN

## 2020-02-04 MED ORDER — LACTATED RINGERS IV SOLN: INTRAVENOUS | Status: DC

## 2020-02-04 MED ORDER — FENTANYL CITRATE (PF) 100 MCG/2ML IJ SOLN
INTRAMUSCULAR | Status: AC
  Filled 2020-02-04: qty 2

## 2020-02-04 MED ORDER — DOCUSATE SODIUM 100 MG PO CAPS
100.0000 mg | ORAL_CAPSULE | Freq: Every day | ORAL | Status: DC
  Administered 2020-02-05 – 2020-02-07 (×3): 100 mg via ORAL
  Filled 2020-02-04 (×3): qty 1

## 2020-02-04 MED ORDER — PHENYLEPHRINE 40 MCG/ML (10ML) SYRINGE FOR IV PUSH (FOR BLOOD PRESSURE SUPPORT)
PREFILLED_SYRINGE | INTRAVENOUS | Status: AC
  Filled 2020-02-04: qty 10

## 2020-02-04 MED ORDER — VANCOMYCIN HCL 1000 MG IV SOLR
INTRAVENOUS | Status: AC
  Filled 2020-02-04: qty 1000

## 2020-02-04 MED ORDER — LAMOTRIGINE 100 MG PO TABS
150.0000 mg | ORAL_TABLET | Freq: Two times a day (BID) | ORAL | Status: DC
  Administered 2020-02-04 – 2020-02-07 (×6): 150 mg via ORAL
  Filled 2020-02-04 (×6): qty 2

## 2020-02-04 MED ORDER — METHOCARBAMOL 500 MG PO TABS
500.0000 mg | ORAL_TABLET | Freq: Three times a day (TID) | ORAL | Status: DC | PRN
  Administered 2020-02-05 – 2020-02-07 (×2): 500 mg via ORAL
  Filled 2020-02-04 (×2): qty 1

## 2020-02-04 MED ORDER — CEFAZOLIN SODIUM-DEXTROSE 2-4 GM/100ML-% IV SOLN
2.0000 g | Freq: Three times a day (TID) | INTRAVENOUS | Status: AC
  Administered 2020-02-04 (×2): 2 g via INTRAVENOUS
  Filled 2020-02-04 (×2): qty 100

## 2020-02-04 MED ORDER — OLANZAPINE 5 MG PO TABS
2.5000 mg | ORAL_TABLET | Freq: Two times a day (BID) | ORAL | Status: DC
  Administered 2020-02-04 – 2020-02-07 (×6): 2.5 mg via ORAL
  Filled 2020-02-04 (×6): qty 1

## 2020-02-04 MED ORDER — ALBUMIN HUMAN 5 % IV SOLN: INTRAVENOUS | Status: DC | PRN

## 2020-02-04 MED ORDER — 0.9 % SODIUM CHLORIDE (POUR BTL) OPTIME
TOPICAL | Status: DC | PRN
  Administered 2020-02-04: 09:00:00 1000 mL

## 2020-02-04 MED ORDER — ASCORBIC ACID 500 MG PO TABS
1000.0000 mg | ORAL_TABLET | Freq: Every day | ORAL | Status: DC
  Administered 2020-02-04 – 2020-02-07 (×4): 1000 mg via ORAL
  Filled 2020-02-04 (×4): qty 2

## 2020-02-04 MED ORDER — PROPOFOL 10 MG/ML IV BOLUS
INTRAVENOUS | Status: DC | PRN
  Administered 2020-02-04: 20 mg via INTRAVENOUS
  Administered 2020-02-04: 25 mg via INTRAVENOUS

## 2020-02-04 MED ORDER — ENOXAPARIN SODIUM 40 MG/0.4ML ~~LOC~~ SOLN
40.0000 mg | SUBCUTANEOUS | Status: DC
  Administered 2020-02-05 – 2020-02-07 (×3): 40 mg via SUBCUTANEOUS
  Filled 2020-02-04 (×3): qty 0.4

## 2020-02-04 MED ORDER — MIDAZOLAM HCL 2 MG/2ML IJ SOLN
INTRAMUSCULAR | Status: AC
  Filled 2020-02-04: qty 2

## 2020-02-04 MED ORDER — PROPOFOL 500 MG/50ML IV EMUL
INTRAVENOUS | Status: DC | PRN
  Administered 2020-02-04: 5 ug/kg/min via INTRAVENOUS

## 2020-02-04 MED ORDER — LIDOCAINE HCL (CARDIAC) PF 100 MG/5ML IV SOSY
PREFILLED_SYRINGE | INTRAVENOUS | Status: DC | PRN
  Administered 2020-02-04: 60 mg via INTRAVENOUS

## 2020-02-04 MED ORDER — ACETAMINOPHEN 500 MG PO TABS
1000.0000 mg | ORAL_TABLET | Freq: Once | ORAL | Status: AC
  Administered 2020-02-04: 1000 mg via ORAL
  Filled 2020-02-04: qty 2

## 2020-02-04 MED ORDER — LIDOCAINE 2% (20 MG/ML) 5 ML SYRINGE
INTRAMUSCULAR | Status: AC
  Filled 2020-02-04: qty 5

## 2020-02-04 MED ORDER — CHLORHEXIDINE GLUCONATE 4 % EX LIQD: 60.0000 mL | Freq: Once | CUTANEOUS | Status: DC

## 2020-02-04 MED ORDER — PHENOL 1.4 % MT LIQD: 1.0000 | OROMUCOSAL | Status: DC | PRN

## 2020-02-04 MED ORDER — ALUM & MAG HYDROXIDE-SIMETH 200-200-20 MG/5ML PO SUSP: 15.0000 mL | ORAL | Status: DC | PRN

## 2020-02-04 MED ORDER — ONDANSETRON HCL 4 MG/2ML IJ SOLN: 4.0000 mg | Freq: Four times a day (QID) | INTRAMUSCULAR | Status: DC | PRN

## 2020-02-04 MED ORDER — BISACODYL 10 MG RE SUPP: 10.0000 mg | Freq: Every day | RECTAL | Status: DC | PRN

## 2020-02-04 MED ORDER — FENTANYL CITRATE (PF) 100 MCG/2ML IJ SOLN: 25.0000 ug | INTRAMUSCULAR | Status: DC | PRN

## 2020-02-04 MED ORDER — FENTANYL CITRATE (PF) 100 MCG/2ML IJ SOLN
50.0000 ug | Freq: Once | INTRAMUSCULAR | Status: AC
  Administered 2020-02-04: 50 ug via INTRAVENOUS

## 2020-02-04 MED ORDER — ACETAMINOPHEN 650 MG RE SUPP: 325.0000 mg | RECTAL | Status: DC | PRN

## 2020-02-04 MED ORDER — HYDROMORPHONE HCL 1 MG/ML IJ SOLN
0.5000 mg | INTRAMUSCULAR | Status: DC | PRN
  Administered 2020-02-05 – 2020-02-06 (×7): 1 mg via INTRAVENOUS
  Filled 2020-02-04 (×7): qty 1

## 2020-02-04 MED ORDER — PANTOPRAZOLE SODIUM 40 MG PO TBEC
40.0000 mg | DELAYED_RELEASE_TABLET | Freq: Every day | ORAL | Status: DC
  Administered 2020-02-05 – 2020-02-07 (×3): 40 mg via ORAL
  Filled 2020-02-04 (×3): qty 1

## 2020-02-04 MED ORDER — LABETALOL HCL 5 MG/ML IV SOLN: 10.0000 mg | INTRAVENOUS | Status: DC | PRN

## 2020-02-04 MED ORDER — MAGNESIUM HYDROXIDE 400 MG/5ML PO SUSP: 30.0000 mL | Freq: Every day | ORAL | Status: DC | PRN

## 2020-02-04 MED ORDER — PHENYLEPHRINE HCL (PRESSORS) 10 MG/ML IV SOLN
INTRAVENOUS | Status: DC | PRN
  Administered 2020-02-04 (×5): 80 ug via INTRAVENOUS

## 2020-02-04 MED ORDER — GUAIFENESIN-DM 100-10 MG/5ML PO SYRP: 15.0000 mL | ORAL_SOLUTION | ORAL | Status: DC | PRN

## 2020-02-04 MED ORDER — FENTANYL CITRATE (PF) 250 MCG/5ML IJ SOLN
INTRAMUSCULAR | Status: AC
  Filled 2020-02-04: qty 5

## 2020-02-04 MED ORDER — OXYCODONE HCL 5 MG PO TABS
5.0000 mg | ORAL_TABLET | ORAL | Status: DC | PRN
  Administered 2020-02-05 – 2020-02-06 (×7): 10 mg via ORAL
  Administered 2020-02-06: 5 mg via ORAL
  Administered 2020-02-07 (×3): 10 mg via ORAL
  Filled 2020-02-04 (×11): qty 2

## 2020-02-04 MED ORDER — VANCOMYCIN HCL 1000 MG IV SOLR
INTRAVENOUS | Status: DC | PRN
  Administered 2020-02-04: 1000 mg via TOPICAL

## 2020-02-04 MED ORDER — ONDANSETRON HCL 4 MG/2ML IJ SOLN
INTRAMUSCULAR | Status: DC | PRN
  Administered 2020-02-04: 4 mg via INTRAVENOUS

## 2020-02-04 MED ORDER — HYDRALAZINE HCL 20 MG/ML IJ SOLN: 5.0000 mg | INTRAMUSCULAR | Status: DC | PRN

## 2020-02-04 MED ORDER — DEXAMETHASONE SODIUM PHOSPHATE 10 MG/ML IJ SOLN
INTRAMUSCULAR | Status: AC
  Filled 2020-02-04: qty 1

## 2020-02-04 MED ORDER — PHENYLEPHRINE HCL-NACL 10-0.9 MG/250ML-% IV SOLN
INTRAVENOUS | Status: DC | PRN
  Administered 2020-02-04: 25 ug/min via INTRAVENOUS

## 2020-02-04 MED ORDER — ACETAMINOPHEN 325 MG PO TABS
650.0000 mg | ORAL_TABLET | Freq: Four times a day (QID) | ORAL | Status: DC
  Administered 2020-02-04 – 2020-02-07 (×11): 650 mg via ORAL
  Filled 2020-02-04 (×12): qty 2

## 2020-02-04 MED ORDER — PROPOFOL 10 MG/ML IV BOLUS
INTRAVENOUS | Status: AC
  Filled 2020-02-04: qty 40

## 2020-02-04 MED ORDER — CEFAZOLIN SODIUM-DEXTROSE 2-4 GM/100ML-% IV SOLN
2.0000 g | INTRAVENOUS | Status: AC
  Administered 2020-02-04: 2 g via INTRAVENOUS
  Filled 2020-02-04: qty 100

## 2020-02-04 MED ORDER — GLYCOPYRROLATE PF 0.2 MG/ML IJ SOSY
PREFILLED_SYRINGE | INTRAMUSCULAR | Status: AC
  Filled 2020-02-04: qty 1

## 2020-02-04 MED ORDER — METOPROLOL TARTRATE 5 MG/5ML IV SOLN: 2.5000 mg | INTRAVENOUS | Status: DC | PRN

## 2020-02-04 MED ORDER — GLYCOPYRROLATE 0.2 MG/ML IJ SOLN
INTRAMUSCULAR | Status: DC | PRN
  Administered 2020-02-04: .2 mg via INTRAVENOUS

## 2020-02-04 MED ORDER — ACETAMINOPHEN 325 MG PO TABS
325.0000 mg | ORAL_TABLET | ORAL | Status: DC | PRN
  Administered 2020-02-05: 325 mg via ORAL
  Filled 2020-02-04: qty 2

## 2020-02-04 SURGICAL SUPPLY — 44 items
BLADE SAW RECIP 87.9 MT (BLADE) ×3 IMPLANT
BNDG CMPR MED 10X6 ELC LF (GAUZE/BANDAGES/DRESSINGS) ×1
BNDG CMPR STD VLCR NS LF 5.8X4 (GAUZE/BANDAGES/DRESSINGS) ×1
BNDG ELASTIC 4X5.8 VLCR NS LF (GAUZE/BANDAGES/DRESSINGS) ×2 IMPLANT
BNDG ELASTIC 6X10 VLCR STRL LF (GAUZE/BANDAGES/DRESSINGS) ×2 IMPLANT
BNDG GAUZE ELAST 4 BULKY (GAUZE/BANDAGES/DRESSINGS) ×4 IMPLANT
CANISTER PREVENA 45 (CANNISTER) ×2 IMPLANT
COVER SURGICAL LIGHT HANDLE (MISCELLANEOUS) ×6 IMPLANT
CUFF TOURN SGL QUICK 34 (TOURNIQUET CUFF)
CUFF TRNQT CYL 34X4.125X (TOURNIQUET CUFF) ×1 IMPLANT
DRAIN PENROSE 18X1/4 LTX STRL (DRAIN) ×2 IMPLANT
DRAPE EXTREMITY T 121X128X90 (DISPOSABLE) ×3 IMPLANT
DRAPE HALF SHEET 40X57 (DRAPES) ×4 IMPLANT
DRAPE INCISE IOBAN 66X45 STRL (DRAPES) ×2 IMPLANT
DRSG MEPITEL 4X7.2 (GAUZE/BANDAGES/DRESSINGS) ×2 IMPLANT
DRSG PAD ABDOMINAL 8X10 ST (GAUZE/BANDAGES/DRESSINGS) ×4 IMPLANT
DRSG VERAFLO VAC MED (GAUZE/BANDAGES/DRESSINGS) ×2 IMPLANT
FACESHIELD WRAPAROUND (MASK) ×3 IMPLANT
FACESHIELD WRAPAROUND OR TEAM (MASK) IMPLANT
GLOVE BIO SURGEON STRL SZ7.5 (GLOVE) ×3 IMPLANT
GLOVE BIO SURGEON STRL SZ8 (GLOVE) ×3 IMPLANT
GLOVE BIOGEL PI IND STRL 7.5 (GLOVE) ×1 IMPLANT
GLOVE BIOGEL PI IND STRL 8 (GLOVE) ×1 IMPLANT
GLOVE BIOGEL PI INDICATOR 7.5 (GLOVE) ×2
GLOVE BIOGEL PI INDICATOR 8 (GLOVE) ×2
GOWN STRL REUS W/ TWL LRG LVL3 (GOWN DISPOSABLE) ×2 IMPLANT
GOWN STRL REUS W/ TWL XL LVL3 (GOWN DISPOSABLE) ×1 IMPLANT
GOWN STRL REUS W/TWL LRG LVL3 (GOWN DISPOSABLE) ×6
GOWN STRL REUS W/TWL XL LVL3 (GOWN DISPOSABLE) ×3
IMMOBILIZER KNEE 22 UNIV (SOFTGOODS) ×2 IMPLANT
KIT TURNOVER KIT B (KITS) ×3 IMPLANT
MANIFOLD NEPTUNE II (INSTRUMENTS) ×3 IMPLANT
NS IRRIG 1000ML POUR BTL (IV SOLUTION) ×3 IMPLANT
PACK GENERAL/GYN (CUSTOM PROCEDURE TRAY) ×3 IMPLANT
PAD CAST 4YDX4 CTTN HI CHSV (CAST SUPPLIES) IMPLANT
PADDING CAST COTTON 4X4 STRL (CAST SUPPLIES) ×3
PADDING CAST COTTON 6X4 STRL (CAST SUPPLIES) ×2 IMPLANT
SPONGE LAP 18X18 RF (DISPOSABLE) ×4 IMPLANT
STOCKINETTE IMPERVIOUS LG (DRAPES) ×3 IMPLANT
SUT ETHILON 2 0 PSLX (SUTURE) ×4 IMPLANT
SUT PROLENE 0 CT 1 30 (SUTURE) ×4 IMPLANT
TOWEL GREEN STERILE FF (TOWEL DISPOSABLE) ×3 IMPLANT
TUBE CONNECTING 12'X1/4 (SUCTIONS) ×1
TUBE CONNECTING 12X1/4 (SUCTIONS) ×2 IMPLANT

## 2020-02-04 NOTE — Anesthesia Procedure Notes (Signed)
Anesthesia Regional Block: Popliteal block   Pre-Anesthetic Checklist: ,, timeout performed, Correct Patient, Correct Site, Correct Laterality, Correct Procedure, Correct Position, site marked, Risks and benefits discussed,  Surgical consent,  Pre-op evaluation,  At surgeon's request and post-op pain management  Laterality: Right  Prep: chloraprep       Needles:  Injection technique: Single-shot  Needle Type: Echogenic Needle     Needle Length: 9cm  Needle Gauge: 21     Additional Needles:   Procedures:,,,, ultrasound used (permanent image in chart),,,,  Narrative:  Start time: 02/04/2020 7:15 AM End time: 02/04/2020 7:20 AM Injection made incrementally with aspirations every 5 mL.  Performed by: Personally  Anesthesiologist: Catalina Gravel, MD  Additional Notes: No pain on injection. No increased resistance to injection. Injection made in 5cc increments.  Good needle visualization.  Patient tolerated procedure well.

## 2020-02-04 NOTE — Anesthesia Procedure Notes (Signed)
Anesthesia Regional Block: Adductor canal block   Pre-Anesthetic Checklist: ,, timeout performed, Correct Patient, Correct Site, Correct Laterality, Correct Procedure, Correct Position, site marked, Risks and benefits discussed,  Surgical consent,  Pre-op evaluation,  At surgeon's request and post-op pain management  Laterality: Right  Prep: chloraprep       Needles:  Injection technique: Single-shot  Needle Type: Echogenic Needle     Needle Length: 9cm  Needle Gauge: 21     Additional Needles:   Procedures:,,,, ultrasound used (permanent image in chart),,,,  Narrative:  Start time: 02/04/2020 7:20 AM End time: 02/04/2020 7:24 AM Injection made incrementally with aspirations every 5 mL.  Performed by: Personally  Anesthesiologist: Catalina Gravel, MD  Additional Notes: No pain on injection. No increased resistance to injection. Injection made in 5cc increments.  Good needle visualization.  Patient tolerated procedure well.

## 2020-02-04 NOTE — Op Note (Signed)
02/04/2020  10:56 AM  PATIENT:  Austin Dillon  69 y.o. 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 Ainsley Spinner, PA-C; 2. PA Student  ANESTHESIA:   general  I/O:  Total I/O In: 600 [I.V.:600] Out: 100 [Blood:100]  SPECIMEN:  Source of Specimen:  gross only  TOURNIQUET:   Total Tourniquet Time Documented: Thigh (Right) - 55 minutes Total: Thigh (Right) - 55 minutes  COMPLICATIONS: NONE  DICTATION: .Note written in EPIC  DISPOSITION: TO PACU  CONDITION: STABLE  DELAY START OF DVT PROPHYLAXIS BECAUSE OF BLEEDING RISK: NO   INDICATIONS:  [name] is a very 69 y.o. who sustained a fracture back in [id number] complicated by deep infection and worsening tibiotalar, erosive arthritis which could not be controlled or resolved with antibiotics and worsening clinical function. Risks discussed included phantom pain, infection, heterotopic bone, other nerve or vessel injury, bleeding, loss of motion, DVT, PE, heart attack, stroke and others. After acknowledgement of these risks, consent was provided to proceed.  SUMMARY OF PROCEDURE:  The patient was given preoperative antibiotics, taken to the operating room where general anesthesia was induced.  After chlorhexidine wash a standard Betadine scrub and paint was then performed and draped using an impervious stockinette over the foot with Coban.  Incision was marked with a standard posterior based flap.  The leg was elevated, exsanguinated proximal to the area of injury with Esmarch bandage and then a tourniquet inflated to 300 mmHg.  We began by making the skin and fascial incision.  I then went  through the anterior compartment clamping  the anterior vessels.  We also incised through the lateral compartment and exposed the posterior tibia medially placing a Bennett underneath the tibia from that side and a Cobb from the lateral side then performing the cut and beveling the anterior cortex as well. The fibula was resected short of the tibia. This was followed by use of the amputation knife through the posterior tissues.  Clamps were used to control the posterior vessels.  I then divided the anterior tibial artery, vein and nerve, ligating those separately two times for the arteries and once for the vein. The nerves were cut sharply deep within the tissues.  Similarly for the posterior compartment,  this was performed, isolating the posterior tibial artery and nerve.  The nerve was ligated because of the large vaso vasorum and then cut proximal to the tibia.  Some stick ties were used on the venous branching within the soleus.  The muscle was well perfused. Tourniquet was deflated.  Hemostasis was obtained. Irrigation performed aggressively of bone dust and the soft tissues. Additional irrigation will be performed later. This was performed using a #1 for the deep tissues and flap and then a 2-0 Vicryl and 2-0 nylon for the skin.  A sterile gently compressive dressing was applied with an incisional VAC. After the Kerlix and Ace bandage, he was then placed in a knee immobilizer with a padding underneath the stump to keep him in full extension.  The patient was taken to the PACU in stable condition.  Ainsley Spinner, PA-C, and a PA student assisted me throughout and his assistance was necessary for the safe and expeditious treatment of the patient as  we did work simultaneously with closure and he allowed for continued adequate control of the neurovascular bundles. A wound vac and knee immobilizer were then applied.  PROGNOSIS:  The patient will have his dressing changed in the next 72 hours.  We anticipate immediate application of a stump shrinker.  He  will be allowed unrestricted range of motion and a prosthetic fitting.

## 2020-02-04 NOTE — Plan of Care (Signed)

## 2020-02-04 NOTE — Plan of Care (Signed)
  Problem: Education: Goal: Knowledge of General Education information will improve Description: Including pain rating scale, medication(s)/side effects and non-pharmacologic comfort measures Outcome: Progressing   Problem: Clinical Measurements: Goal: Respiratory complications will improve Outcome: Progressing Note: On room air   Problem: Nutrition: Goal: Adequate nutrition will be maintained Outcome: Progressing   Problem: Coping: Goal: Level of anxiety will decrease Outcome: Progressing   Problem: Elimination: Goal: Will not experience complications related to urinary retention Outcome: Progressing   Problem: Pain Managment: Goal: General experience of comfort will improve Outcome: Progressing Note: Just sore to the right stump, treated with scheduled tylenol   Problem: Safety: Goal: Ability to remain free from injury will improve Outcome: Progressing

## 2020-02-04 NOTE — H&P (Signed)
[facility] H&P/Consult     Patient ID: Austin Dillon MRN: [id number] DOB/AGE: 07-13-1951 69 y.o.  Chief Complaint: Chronic right ankle infection HPI: Austin Dillon is an 69 y.o. male.with longstanding ankle infection since 2018 who has been followed by ID and failed to achieve resolution of infection. Progressive ankle joint destruction on x-ray and difficulty with ambulation.  Past Medical History:  Diagnosis Date  . Alzheimer disease ([place])   . Alzheimer's dementia with behavioral disturbance ([place]) 12/11/2019  . Arthritis    right foot  . Headache   . History of blood transfusion   . MSSA (methicillin susceptible Staphylococcus aureus) infection 12/25/2019  . Osteomyelitis of left tibia ([facility]) 12/25/2019  . PTSD (post-traumatic stress disorder)     Past Surgical History:  Procedure Laterality Date  . APPENDECTOMY    . EXTERNAL FIXATION LEG Right 03/26/2017   Procedure: EXTERNAL FIXATION RIGHT LEG;  Surgeon: Mcarthur Rossetti, MD;  Location: [facility];  Service: Orthopedics;  Laterality: Right;  . ORIF ANKLE FRACTURE Right 03/29/2017   Procedure: OPEN REDUCTION INTERNAL FIXATION (ORIF) RIGHT  ANKLE FRACTURE, Removal of External Fixator;  Surgeon: Newt Minion, MD;  Location: [facility];  Service: Orthopedics;  Laterality: Right;  . TOTAL HIP ARTHROPLASTY Right 01/13/2018   Procedure: RIGHT ANTERIOR TOTAL HIP REPLACEMENT;  Surgeon: Mcarthur Rossetti, MD;  Location: [facility];  Service: Orthopedics;  Laterality: Right;    History reviewed. No pertinent family history. Social History:  reports that he has been smoking cigarettes. He has a 37.50 pack-year smoking history. He has never used smokeless tobacco. He reports previous alcohol use. He reports that he does not use drugs.  Allergies:  Allergies  Allergen Reactions  . Nortriptyline Other (See Comments)    Severe mood swings  . Aricept [Donepezil] Other (See Comments)    Makes the patient  look like a "mummy" (too sedating)    Medications Prior to Admission  Medication Sig Dispense Refill  . alprazolam (XANAX) 2 MG tablet Take 2 mg by mouth 3 (three) times daily.     Marland Kitchen amoxicillin-clavulanate (AUGMENTIN) 875-125 MG tablet Take 1 tablet by mouth 2 (two) times daily. 60 tablet 11  . lamoTRIgine (LAMICTAL) 100 MG tablet Take 150 mg by mouth in the morning and at bedtime.     Marland Kitchen OLANZapine (ZYPREXA) 2.5 MG tablet Take 2.5 mg by mouth in the morning and at bedtime.     Marland Kitchen aspirin EC 81 MG tablet Take 1 tablet (81 mg total) by mouth 2 (two) times daily. (Patient not taking: Reported on 02/03/2020) 60 tablet 0  . feeding supplement, ENSURE ENLIVE, (ENSURE ENLIVE) LIQD Take 237 mLs by mouth daily at 3 pm. (Patient not taking: Reported on 02/03/2020) 237 mL 12  . naproxen sodium (ALEVE) 220 MG tablet Take 220 mg by mouth 2 (two) times daily as needed (pain).      Results for orders placed or performed during the hospital encounter of 02/03/20 (from the past 48 hour(s))  SARS CORONAVIRUS 2 (TAT 6-24 HRS) Nasopharyngeal Nasopharyngeal Swab     Status: None   Collection Time: 02/03/20  2:10 PM   Specimen: Nasopharyngeal Swab  Result Value Ref Range   SARS Coronavirus 2 NEGATIVE NEGATIVE    Comment: (NOTE) SARS-CoV-2 target nucleic acids are NOT DETECTED. The SARS-CoV-2 RNA is generally detectable in upper and lower respiratory specimens during the acute phase of infection. Negative results do not preclude SARS-CoV-2 infection, do not rule  out co-infections with other pathogens, and should not be used as the sole basis for treatment or other patient management decisions. Negative results must be combined with clinical observations, patient history, and epidemiological information. The expected result is Negative. Fact Sheet for Patients: SugarRoll.be Fact Sheet for Healthcare Providers: https://www.woods-mathews.com/ This test is not yet approved  or cleared by the [place] FDA and  has been authorized for detection and/or diagnosis of SARS-CoV-2 by FDA under an Emergency Use Authorization (EUA). This EUA will remain  in effect (meaning this test can be used) for the duration of the COVID-19 declaration under Section 56 4(b)(1) of the Act, 21 U.S.C. section 360bbb-3(b)(1), unless the authorization is terminated or revoked sooner. Performed at [facility], [place] [address], [place], [place] [address]    No results found.  ROS No recent fever but swelling in ankle, bleeding abnormalities, urologic dysfunction, GI problems, or weight gain.   Blood pressure (!) 151/73, pulse 68, temperature 97.9 F (36.6 C), temperature source Oral, resp. rate 19, height 5\' 8"  (1.727 m), weight 81.6 kg, SpO2 100 %. 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 These risks have been acknowledged and consent provided to proceed.  [name], MD [facility], [facility] [phone]   02/04/2020, 8:01 AM  [facility] [place] [address] [phone] (F)

## 2020-02-04 NOTE — Progress Notes (Signed)
Inpatient Rehab Admissions:  Inpatient Rehab Consult received.  Pt POD 0 below knee amputation on the RLE.  Will await therapy evaluations and f/u with patient.    Signed: Shann Medal, PT, DPT Admissions Coordinator [phone] 02/04/20  1:50 PM

## 2020-02-04 NOTE — H&P (Addendum)
Orthopaedic Trauma Service (OTS) Consult   Patient ID: Austin Dillon MRN: [id number] DOB/AGE: Jan 13, 1951 69 y.o.    HPI: Austin Dillon is an 69 y.o. male that was seen initially in consultation by the orthopedic trauma service on 10/02/2019.  He sustained a complex open right ankle fracture dislocation 03/26/2017.  Had multiple procedures for this including external fixation and ORIF.  Patient has had wound healing issues essentially from the get go.  He was eventually referred to our office by his primary care physician Dr. Francoise Dillon.  Extensive work-up was conducted including advanced imaging (MRI) which demonstrated evidence of chronic osteomyelitis as well as advanced tibiotalar joint arthritis.  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  Patient was also referred to infectious disease to review various treatment options.  Patient was most recently seen in our office on January 15, 2020 for further discussion of treatment options.  Options included long-term antibiotic suppression, extensive reconstructive surgery including staged procedures and finally below-knee amputation.  After extensive discussion we felt that below-knee amputation would be the most reliable and leading to the most opportunity to return to baseline function.  Please see bottom of this note for scan office consult note and most recent office note  Patient presents today for right below-knee amputation  Past Medical History:  Diagnosis Date  . Alzheimer disease ([place])   . Alzheimer's dementia with behavioral disturbance ([place]) 12/11/2019  . Arthritis    right foot  . Headache   . History of blood transfusion   . MSSA (methicillin susceptible Staphylococcus aureus) infection 12/25/2019  . Osteomyelitis of left tibia ([place]) 12/25/2019  . PTSD (post-traumatic stress disorder)     Past Surgical History:    Procedure Laterality Date  . APPENDECTOMY    . EXTERNAL FIXATION LEG Right 03/26/2017   Procedure: EXTERNAL FIXATION RIGHT LEG;  Surgeon: Mcarthur Rossetti, MD;  Location: [facility];  Service: Orthopedics;  Laterality: Right;  . ORIF ANKLE FRACTURE Right 03/29/2017   Procedure: OPEN REDUCTION INTERNAL FIXATION (ORIF) RIGHT  ANKLE FRACTURE, Removal of External Fixator;  Surgeon: Newt Minion, MD;  Location: [facility];  Service: Orthopedics;  Laterality: Right;  . TOTAL HIP ARTHROPLASTY Right 01/13/2018   Procedure: RIGHT ANTERIOR TOTAL HIP REPLACEMENT;  Surgeon: Mcarthur Rossetti, MD;  Location: [facility];  Service: Orthopedics;  Laterality: Right;    History reviewed. No pertinent family history.  Social History:  reports that he has been smoking cigarettes. He has a 37.50 pack-year smoking history. He has never used smokeless tobacco. He reports previous alcohol use. He reports that he does not use drugs.  Allergies:  Allergies  Allergen Reactions  . Nortriptyline Other (See Comments)    Severe mood swings  . Aricept [Donepezil] Other (See Comments)    Makes the patient look like a "mummy" (too sedating)    Medications: I have reviewed the patient's current medications.  Current Meds  Medication Sig  . alprazolam (XANAX) 2 MG tablet Take 2 mg by mouth 3 (three) times daily.   Marland Kitchen amoxicillin-clavulanate (AUGMENTIN) 875-125 MG tablet Take 1 tablet by mouth 2 (two) times daily.  Marland Kitchen lamoTRIgine (LAMICTAL) 100 MG tablet Take 150 mg by mouth in the morning and at bedtime.   Marland Kitchen OLANZapine (ZYPREXA) 2.5 MG tablet Take 2.5 mg by mouth in the morning and at bedtime.      Results  for orders placed or performed during the hospital encounter of 02/03/20 (from the past 48 hour(s))  SARS CORONAVIRUS 2 (TAT 6-24 HRS) Nasopharyngeal Nasopharyngeal Swab     Status: None   Collection Time: 02/03/20  2:10 PM   Specimen: Nasopharyngeal Swab  Result Value Ref Range   SARS Coronavirus 2 NEGATIVE NEGATIVE     Comment: (NOTE) SARS-CoV-2 target nucleic acids are NOT DETECTED. The SARS-CoV-2 RNA is generally detectable in upper and lower respiratory specimens during the acute phase of infection. Negative results do not preclude SARS-CoV-2 infection, do not rule out co-infections with other pathogens, and should not be used as the sole basis for treatment or other patient management decisions. Negative results must be combined with clinical observations, patient history, and epidemiological information. The expected result is Negative. Fact Sheet for Patients: SugarRoll.be Fact Sheet for Healthcare Providers: https://www.woods-mathews.com/ This test is not yet approved or cleared by the [place] FDA and  has been authorized for detection and/or diagnosis of SARS-CoV-2 by FDA under an Emergency Use Authorization (EUA). This EUA will remain  in effect (meaning this test can be used) for the duration of the COVID-19 declaration under Section 56 4(b)(1) of the Act, 21 U.S.C. section 360bbb-3(b)(1), unless the authorization is terminated or revoked sooner. Performed at [facility], [place] [address], [place], [place] [address]     No results found.  Review of Systems  Constitutional: Negative for chills and fever.  HENT: Negative.   Respiratory: Negative for shortness of breath and wheezing.   Cardiovascular: Negative for chest pain and palpitations.  Gastrointestinal: Negative for abdominal pain, nausea and vomiting.  Genitourinary: Negative for dysuria.  Musculoskeletal:       Chronic right ankle pain, chronic draining wound  Neurological: Negative for tingling and sensory change.  Endo/Heme/Allergies: Does not bruise/bleed easily.  Psychiatric/Behavioral: Positive for memory loss.     Blood pressure (!) 151/73, pulse 68, temperature 97.9 F (36.6 C), temperature source Oral, resp. rate 19, height 5\' 8"  (1.727 m), weight 81.6 kg,  SpO2 100 %. Physical Exam Vitals and nursing note reviewed.  Constitutional:      General: He is not in acute distress. HENT:     Head: Normocephalic and atraumatic.     Mouth/Throat:     Mouth: Mucous membranes are moist.  Cardiovascular:     Rate and Rhythm: Normal rate and regular rhythm.     Heart sounds: S1 normal and S2 normal.  Pulmonary:     Effort: Pulmonary effort is normal. No respiratory distress.  Abdominal:     General: Bowel sounds are normal.     Palpations: Abdomen is soft.  Musculoskeletal:     Comments: Right lower extremity Chronic edema Traumatic wound medial ankle which is scarred down to the bone Lateral wounds are healed to the right ankle No active drainage noted No erythema Sensory and motor functions distally are reduced globally but grossly intact + DP pulse Skin changes distally consistent with peripheral vascular disease Patient also has tobacco staining of his index and long fingers of the left hand consistent with heavy tobacco use   Skin:    General: Skin is warm.  Neurological:     Mental Status: He is alert.  Psychiatric:        Mood and Affect: Mood normal.        Behavior: Behavior is cooperative.     Assessment/Plan:  69 year old male with chronic osteomyelitis,chronic septic arthritis and chronic pain of right ankle  following open fracture dislocation of right ankle 03/2017  -Chronic osteomyelitis, chronic septic arthritis and chronic pain right ankle  Multiple treatment options discussed with patient  We all feel that below-knee amputation would provide him with the best opportunity to regain higher level of function  Patient and wife agree to proceed with this  Patient will be admitted postoperatively for pain control and therapies  Will likely obtain inpatient rehab consult   - Pain management:  Titrate postoperatively  - ABL anemia/Hemodynamics  Monitor  - Medical issues   Restart home medications after surgery  -  DVT/PE prophylaxis:  Lovenox postop  - ID:   Perioperative antibiotics  - Activity:  Therapy eval's postop  - FEN/GI prophylaxis/Foley/Lines:  Npo   Advance diet postoperatively  - Impediments to fracture/wound healing:  Nicotine use  ?  Compliance with wound care  - Dispo:  OR today for right below-knee amputation    Jari Pigg, PA-C [phone] (C) 02/04/2020, 7:53 AM  [facility] [place] [phone] Domingo Sep (F)

## 2020-02-04 NOTE — Anesthesia Postprocedure Evaluation (Signed)
Anesthesia Post Note  Patient: Austin Dillon  Procedure(s) Performed: AMPUTATION BELOW KNEE (Right Knee)     Patient location during evaluation: PACU Anesthesia Type: Regional Level of consciousness: awake and alert, awake and oriented Pain management: pain level controlled Vital Signs Assessment: post-procedure vital signs reviewed and stable Respiratory status: spontaneous breathing, nonlabored ventilation and respiratory function stable Cardiovascular status: stable and blood pressure returned to baseline Postop Assessment: no apparent nausea or vomiting Anesthetic complications: no    Last Vitals:  Vitals:   02/04/20 1117 02/04/20 1200  BP:  (!) 145/78  Pulse: 68 64  Resp: 19 17  Temp:  (!) 36.3 C  SpO2: 99% 97%    Last Pain:  Vitals:   02/04/20 1231  TempSrc:   PainSc: 2                  Catalina Gravel

## 2020-02-04 NOTE — Transfer of Care (Signed)
Immediate Anesthesia Transfer of Care Note  Patient: Aliene Altes  Procedure(s) Performed: AMPUTATION BELOW KNEE (Right Knee)  Patient Location: PACU  Anesthesia Type:MAC and Regional  Level of Consciousness: awake, alert , oriented and sedated  Airway & Oxygen Therapy: Patient Spontanous Breathing and Patient connected to face mask oxygen  Post-op Assessment: Report given to RN, Post -op Vital signs reviewed and stable and Patient moving all extremities  Post vital signs: Reviewed and stable  Last Vitals:  Vitals Value Taken Time  BP 97/58 02/04/20 1100  Temp    Pulse 68 02/04/20 1106  Resp 16 02/04/20 1106  SpO2 100 % 02/04/20 1106  Vitals shown include unvalidated device data.  Last Pain:  Vitals:   02/04/20 0654  TempSrc:   PainSc: 7       Patients Stated Pain Goal: 0 (16/10/96 0454)  Complications: No apparent anesthesia complications

## 2020-02-05 DIAGNOSIS — M86661 Other chronic osteomyelitis, right tibia and fibula: Secondary | ICD-10-CM

## 2020-02-05 DIAGNOSIS — F4310 Post-traumatic stress disorder, unspecified: Secondary | ICD-10-CM | POA: Diagnosis present

## 2020-02-05 DIAGNOSIS — F0280 Dementia in other diseases classified elsewhere without behavioral disturbance: Secondary | ICD-10-CM | POA: Diagnosis present

## 2020-02-05 DIAGNOSIS — F17200 Nicotine dependence, unspecified, uncomplicated: Secondary | ICD-10-CM

## 2020-02-05 HISTORY — DX: Nicotine dependence, unspecified, uncomplicated: F17.200

## 2020-02-05 HISTORY — DX: Other chronic osteomyelitis, right tibia and fibula: M86.661

## 2020-02-05 LAB — CBC
HCT: 40.3 % (ref 39.0–52.0)
Hemoglobin: 13.2 g/dL (ref 13.0–17.0)
MCH: 32.9 pg (ref 26.0–34.0)
MCHC: 32.8 g/dL (ref 30.0–36.0)
MCV: 100.5 fL — ABNORMAL HIGH (ref 80.0–100.0)
Platelets: 190 10*3/uL (ref 150–400)
RBC: 4.01 MIL/uL — ABNORMAL LOW (ref 4.22–5.81)
RDW: 12.2 % (ref 11.5–15.5)
WBC: 8.9 10*3/uL (ref 4.0–10.5)
nRBC: 0 % (ref 0.0–0.2)

## 2020-02-05 LAB — BASIC METABOLIC PANEL
Anion gap: 7 (ref 5–15)
BUN: 8 mg/dL (ref 8–23)
CO2: 26 mmol/L (ref 22–32)
Calcium: 8.7 mg/dL — ABNORMAL LOW (ref 8.9–10.3)
Chloride: 106 mmol/L (ref 98–111)
Creatinine, Ser: 0.99 mg/dL (ref 0.61–1.24)
GFR calc Af Amer: >60 mL/min (ref >60)
GFR calc non Af Amer: >60 mL/min (ref >60)
Glucose, Bld: 122 mg/dL — ABNORMAL HIGH (ref 70–99)
Potassium: 3.8 mmol/L (ref 3.5–5.1)
Sodium: 139 mmol/L (ref 135–145)

## 2020-02-05 LAB — VITAMIN D 25 HYDROXY (VIT D DEFICIENCY, FRACTURES): Vit D, 25-Hydroxy: 10.23 ng/mL — ABNORMAL LOW (ref 30–100)

## 2020-02-05 NOTE — Evaluation (Addendum)
Occupational Therapy Evaluation Patient Details Name: Austin Dillon MRN: [id number] DOB: 21-Jul-1951 Today's Date: 02/05/2020    History of Present Illness Austin Dillon is an 69 y.o. male that was seen initially in consultation by the orthopedic trauma service on 10/02/2019.  He sustained a complex open right ankle fracture dislocation 03/26/2017.  Had multiple procedures for this including external fixation and ORIF.  Patient has had wound healing issues essentially from the get go. Now s/p R BKA due to Chronic septic arthritis right ankle joint with tibiotalar arthritis   Clinical Impression   PTA, pt was living at home with his wife, and reports he was independent with ADL wife assists with IADL, pt reports he was using crutches at baseline. Per chart, pt and wife with memory limitations at baseline. Pt currently requires minguard for functional mobility during ADL completion. Educated pt on residual limb wrapping, importance of knee extension, and elevating residual limb. Educated pt on phantom limb sensation, phantom limb pain and desensitization strategies. Due to decline in current level of function, pt would benefit from acute OT to address established goals to facilitate safe D/C to venue listed below. At this time, recommend CIR follow-up. Will continue to follow acutely.     Follow Up Recommendations  CIR    Equipment Recommendations  Other (comment)(TBD at next venue)    Recommendations for Other Services       Precautions / Restrictions Precautions Precautions: Other (comment) Precaution Comments: Keep R knee straight at rest Required Braces or Orthoses: Knee Immobilizer - Right Restrictions Weight Bearing Restrictions: Yes RLE Weight Bearing: Non weight bearing      Mobility Bed Mobility Overal bed mobility: Modified Independent             General bed mobility comments: Used bed features  Transfers Overall transfer level: Needs assistance Equipment used:  Rolling walker (2 wheeled) Transfers: Sit to/from Stand Sit to Stand: Min guard         General transfer comment: Cues for hand placement and to self-monitor for activity tolerance    Balance Overall balance assessment: Needs assistance Sitting-balance support: No upper extremity supported;Feet supported Sitting balance-Leahy Scale: Good     Standing balance support: Single extremity supported;During functional activity Standing balance-Leahy Scale: Fair Standing balance comment: reliant on at least single UE support in standing to complete ADL                           ADL either performed or assessed with clinical judgement   ADL Overall ADL's : Needs assistance/impaired Eating/Feeding: Set up;Sitting   Grooming: Min guard;Standing   Upper Body Bathing: Set up;Sitting   Lower Body Bathing: Min guard;Sit to/from stand   Upper Body Dressing : Set up;Sitting   Lower Body Dressing: Minimal assistance;Sit to/from stand Lower Body Dressing Details (indicate cue type and reason): minA for KI Toilet Transfer: Min Marine scientist Details (indicate cue type and reason): ambulated into bathroom  Toileting- Clothing Manipulation and Hygiene: Min guard;Sit to/from stand       Functional mobility during ADLs: Min guard;Rolling walker General ADL Comments: minguard for safety;with with good stability while standing with single UE support duing ADL completion     Vision Baseline Vision/History: Wears glasses Wears Glasses: Reading only Patient Visual Report: No change from baseline Vision Assessment?: No apparent visual deficits     Perception     Praxis      Pertinent Vitals/Pain Pain Assessment:  0-10 Pain Score: 9  Pain Location: R residual limb, after walking Pain Descriptors / Indicators: Aching Pain Intervention(s): Monitored during session;Limited activity within patient's tolerance;Repositioned     Hand Dominance Right    Extremity/Trunk Assessment Upper Extremity Assessment Upper Extremity Assessment: Overall WFL for tasks assessed   Lower Extremity Assessment Lower Extremity Assessment: RLE deficits/detail RLE Deficits / Details: A/p BKA; good knee extension in supine, including with modest straight leg raise; Notable R hamstring tightness in sitting with knee extension   Cervical / Trunk Assessment Cervical / Trunk Assessment: Normal   Communication Communication Communication: No difficulties   Cognition Arousal/Alertness: Awake/alert Behavior During Therapy: WFL for tasks assessed/performed Overall Cognitive Status: History of cognitive impairments - at baseline Area of Impairment: Memory                               General Comments: per PA, pt has memory issues at baseline, reports he will have exact same conversation as previous appointment.     General Comments  vss;educated pt on importance of knee extension, elevation, and residual limb wrapping in preparation for prosthetic    Exercises Exercises: Other exercises Other Exercises Other Exercises: seated Hamstring stretch x3 Other Exercises: Bolstered bridging x10   Shoulder Instructions      Home Living Family/patient expects to be discharged to:: Private residence Living Arrangements: Spouse/significant other Available Help at Discharge: Family;Available 24 hours/day Type of Home: House Home Access: Ramped entrance     Home Layout: One level;Laundry or work area in basement     [employer] Shower/Tub: Teacher, early years/pre: [place]: Crutches;Walker - 4 wheels;Tub bench;Wheelchair - manual   Additional Comments: Very well-epuipped from previous surgeries      Prior Functioning/Environment Level of Independence: Independent with assistive device(s)        Comments: Just prior to this admission, using crutches to get around; REports no recent falls;pt and wife with short  term memory issues at baseline, per PA;pt reports he was independent with ADL, wife assists with IADL        OT Problem List: Decreased activity tolerance;Impaired balance (sitting and/or standing);Decreased cognition;Decreased safety awareness;Pain      OT Treatment/Interventions: Self-care/ADL training;Therapeutic exercise;DME and/or AE instruction;Therapeutic activities;Cognitive remediation/compensation;Patient/family education;Balance training    OT Goals(Current goals can be found in the care plan section) Acute Rehab OT Goals Patient Stated Goal: Back to walking normally OT Goal Formulation: With patient Time For Goal Achievement: 02/19/20 Potential to Achieve Goals: Good ADL Goals Pt Will Perform Grooming: with supervision;standing Pt Will Perform Lower Body Dressing: with supervision;sit to/from stand Pt Will Transfer to Toilet: with supervision;ambulating Additional ADL Goal #1: Pt will demonstrate modified independence with residual limb wrapping and skin checks in preparation for prosthetic.  OT Frequency: Min 2X/week   Barriers to D/C: Decreased caregiver support  pt lives with wife who has poor memory according to PA       Co-evaluation              AM-PAC OT "6 Clicks" Daily Activity     Outcome Measure Help from another person eating meals?: None Help from another person taking care of personal grooming?: A Little Help from another person toileting, which includes using toliet, bedpan, or urinal?: A Little Help from another person bathing (including washing, rinsing, drying)?: A Little Help from another person to put on and taking off regular upper  body clothing?: A Little Help from another person to put on and taking off regular lower body clothing?: A Little 6 Click Score: 19   End of Session Equipment Utilized During Treatment: Gait belt;Rolling walker;Right knee immobilizer Nurse Communication: Mobility status  Activity Tolerance: Patient tolerated  treatment well Patient left: in bed;with bed alarm set;with call bell/phone within reach  OT Visit Diagnosis: Unsteadiness on feet (R26.81);Other abnormalities of gait and mobility (R26.89);Muscle weakness (generalized) (M62.81);Other symptoms and signs involving cognitive function;Pain Pain - Right/Left: Right Pain - part of body: Leg                Time: [id number] OT Time Calculation (min): 24 min Charges:  OT General Charges $OT Visit: 1 Visit OT Evaluation $OT Eval Moderate Complexity: 1 Mod OT Treatments $Self Care/Home Management : 8-22 mins  Dorinda Hill OTR/L Acute Rehabilitation Services Office: [place] 02/05/2020, 10:08 AM

## 2020-02-05 NOTE — Plan of Care (Signed)

## 2020-02-05 NOTE — Progress Notes (Signed)
Orthopaedic Trauma Service Progress Note  Patient ID: VILLA HOLLRAH MRN: [id number] DOB/AGE: 1951-02-16 69 y.o.  Subjective:  Doing well this am  Block wore off around 0500 C/o more soreness than frank pain Denies phantom pain   Ate dinner yesterday Waiting for breakfast  Lives only with his wife In a house As noted in previous encounters with pt and wife, they both have memory issues.   Review of Systems  Constitutional: Negative for chills and fever.  Respiratory: Negative for shortness of breath.   Cardiovascular: Negative for chest pain and palpitations.  Gastrointestinal: Negative for abdominal pain, nausea and vomiting.    Objective:   VITALS:   Vitals:   02/04/20 1914 02/05/20 0014 02/05/20 0428 02/05/20 0746  BP: 130/69 126/67 127/63 133/74  Pulse: 75 69 74 71  Resp: 17 17 18 18   Temp: 98.4 F (36.9 C) 98 F (36.7 C) 97.9 F (36.6 C) 98.8 F (37.1 C)  TempSrc: Oral Oral Oral Oral  SpO2: 97% 95% 91% 96%  Weight:      Height:        Estimated body mass index is 27.37 kg/m as calculated from the following:   Height as of this encounter: 5\' 8"  (1.727 m).   Weight as of this encounter: 81.6 kg.   Intake/Output      03/02 0701 - 03/03 0700 03/03 0701 - 03/04 0700   P.O. 480    I.V. (mL/kg) 1230.8 (15.1)    IV Piggyback 550    Total Intake(mL/kg) 2260.8 (27.7)    Urine (mL/kg/hr) 1675 (0.9)    Blood 100    Total Output 1775    Net +485.8           LABS  Results for orders placed or performed during the hospital encounter of 02/04/20 (from the past 24 hour(s))  Basic metabolic panel     Status: Abnormal   Collection Time: 02/05/20  3:55 AM  Result Value Ref Range   Sodium 139 135 - 145 mmol/L   Potassium 3.8 3.5 - 5.1 mmol/L   Chloride 106 98 - 111 mmol/L   CO2 26 22 - 32 mmol/L   Glucose, Bld 122 (H) 70 - 99 mg/dL   BUN 8 8 - 23 mg/dL   Creatinine, Ser 0.99 0.61 -  1.24 mg/dL   Calcium 8.7 (L) 8.9 - 10.3 mg/dL   GFR calc non Af Amer >60 >60 mL/min   GFR calc Af Amer >60 >60 mL/min   Anion gap 7 5 - 15  CBC     Status: Abnormal   Collection Time: 02/05/20  3:55 AM  Result Value Ref Range   WBC 8.9 4.0 - 10.5 K/uL   RBC 4.01 (L) 4.22 - 5.81 MIL/uL   Hemoglobin 13.2 13.0 - 17.0 g/dL   HCT 40.3 39.0 - 52.0 %   MCV 100.5 (H) 80.0 - 100.0 fL   MCH 32.9 26.0 - 34.0 pg   MCHC 32.8 30.0 - 36.0 g/dL   RDW 12.2 11.5 - 15.5 %   Platelets 190 150 - 400 K/uL   nRBC 0.0 0.0 - 0.2 %     PHYSICAL EXAM:   Gen: sitting up in bed, pleasant appearing, NAD, appears well  Lungs: clear anterior fields Cardiac: regular Abd: + BS Ext:  Right Lower Extremity   Dressing c/d/i  Knee immobilizer in place  Vac functioning  Assessment/Plan: 1 Day Post-Op   Principal Problem:   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[place]), chronic  Active Problems:   Depression with anxiety   PTSD (post-traumatic stress disorder)   Alzheimer disease ([place])   Chronic osteomyelitis of right lower leg ([place])   Anti-infectives (From admission, onward)   Start     Dose/Rate Route Frequency Ordered Stop   02/04/20 1600  ceFAZolin (ANCEF) IVPB 2g/100 mL premix     2 g 200 mL/hr over 30 Minutes Intravenous Every 8 hours 02/04/20 1211 02/04/20 2348   02/04/20 1002  vancomycin (VANCOCIN) powder  Status:  Discontinued       As needed 02/04/20 1002 02/04/20 1054   02/04/20 0645  ceFAZolin (ANCEF) IVPB 2g/100 mL premix     2 g 200 mL/hr over 30 Minutes Intravenous On call to O.R. 02/04/20 [id number] 02/04/20 0827    .  POD/HD#: 76  69 year old male with chronic osteomyelitis,chronic septic arthritis and chronic pain of right ankle following open fracture dislocation of right ankle 03/2017   -Chronic osteomyelitis, chronic septic arthritis and chronic pain right ankle s/p R BKA              up with therapies today   Dressing change tomorrow along with dc vac  Have ordered stump protector  and stump shrinker    Will apply shrinker tomorrow  CIR eval?     - Pain management:             continue with current regimen   Multimodal analgesia  May consider adding gabapentin or lyrica but will hold for now     - ABL anemia/Hemodynamics           stable    - Medical issues              Home meds  - DVT/PE prophylaxis:             Lovenox x 21 days    - ID:              Perioperative antibiotics   - Activity:             therapy    - FEN/GI prophylaxis/Foley/Lines:            reg diet    - Impediments to fracture/wound healing:             Nicotine use             ?  Compliance with wound care   - Dispo:             therapy evals  Dressing change tomorrow  CIR screen      Jari Pigg, PA-C [phone] (C) 02/05/2020, 9:09 AM  [facility] [place] [phone] Domingo Sep (F)

## 2020-02-05 NOTE — Evaluation (Signed)
Physical Therapy Evaluation Patient Details Name: Austin Dillon MRN: [id number] DOB: 1951/09/23 Today's Date: 02/05/2020   History of Present Illness  Austin Dillon is an 69 y.o. male that was seen initially in consultation by the orthopedic trauma service on 10/02/2019.  He sustained a complex open right ankle fracture dislocation 03/26/2017.  Had multiple procedures for this including external fixation and ORIF.  Patient has had wound healing issues essentially from the get go. 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 Comes from home, where he and his wife were managing; Per discussion with Ortho Trauma, both have issues with memory; Pt tells me he uses crutches mostly, RW some; Presents with decr functional mobility, decr knowledge of precautions, functional dependencies; Participating very well, and motivated to prepare for getting a prosthesis; Strongly recommend CIR for intensive therapies to best lay the foundation to prpare for prosthesis;  Patient will benefit from skilled PT to increase their independence and safety with mobility to allow discharge to the venue listed below.       Follow Up Recommendations CIR    Equipment Recommendations  None recommended by PT(quite well-equipped)    Recommendations for Other Services       Precautions / Restrictions Precautions Precautions: Other (comment) Precaution Comments: Keep R knee straight at rest Required Braces or Orthoses: Knee Immobilizer - Right Restrictions Weight Bearing Restrictions: Yes RLE Weight Bearing: Non weight bearing      Mobility  Bed Mobility Overal bed mobility: Modified Independent             General bed mobility comments: Used bed features  Transfers Overall transfer level: Needs assistance Equipment  used: Rolling walker (2 wheeled) Transfers: Sit to/from Stand Sit to Stand: Min guard         General transfer comment: Cues for hand placement, safety,  and to self-monitor for activity tolerance  Ambulation/Gait Ambulation/Gait assistance: Min guard(with and without physical contact) Gait Distance (Feet): 20 Feet(in room) Assistive device: Rolling walker (2 wheeled) Gait Pattern/deviations: Step-to pattern     General Gait Details: Cues for technique and to take it slowly for safety; Good support of bodyweight on RW for LLE advacncement  Stairs            Wheelchair Mobility    Modified Rankin (Stroke Patients Only)       Balance Overall balance assessment: Needs assistance Sitting-balance support: No upper extremity supported;Feet supported Sitting balance-Leahy Scale: Good     Standing balance support: Single extremity supported;During functional activity Standing balance-Leahy Scale: Fair Standing balance comment: reliant on at least single UE support in standing to complete ADL                             Pertinent Vitals/Pain Pain Assessment: 0-10 Pain Score: 9  Pain Location: R residual limb, after walking Pain Descriptors / Indicators: Aching Pain Intervention(s): Monitored during session;Limited activity within patient's tolerance;Repositioned    Home Living Family/patient expects to be discharged to:: Private residence Living Arrangements: Spouse/significant other Available Help at Discharge: Family;Available 24 hours/day Type of Home: House Home Access: Ramped entrance     Home Layout: One level;Laundry or work area in [place]: Geologist, engineering - 4 wheels;Tub bench;Wheelchair - manual Additional Comments: Very well-epuipped from previous  surgeries    Prior Function Level of Independence: Independent with assistive device(s)         Comments: Just prior to this admission, using crutches to get around; REports no  recent falls;pt and wife with short term memory issues at baseline, per PA;pt reports he was independent with ADL, wife assists with IADL     Hand Dominance   Dominant Hand: Right    Extremity/Trunk Assessment   Upper Extremity Assessment Upper Extremity Assessment: Overall WFL for tasks assessed    Lower Extremity Assessment Lower Extremity Assessment: RLE deficits/detail RLE Deficits / Details: A/p BKA; good knee extension in supine, including with modest straight leg raise; Notable R hamstring tightness in sitting with knee extension    Cervical / Trunk Assessment Cervical / Trunk Assessment: Normal  Communication   Communication: No difficulties  Cognition Arousal/Alertness: Awake/alert Behavior During Therapy: WFL for tasks assessed/performed Overall Cognitive Status: History of cognitive impairments - at baseline Area of Impairment: Memory                               General Comments: per PA, pt has memory issues at baseline, reports he will have exact same conversation as previous appointment.        General Comments General comments (skin integrity, edema, etc.): Educated pt on importance of hip and knee extension strengthening and hip flexor and hamstring stretching in prep for prosthesis    Exercises Other Exercises Other Exercises: seated Hamstring stretch x3 Other Exercises: Bolstered bridging x10   Assessment/Plan    PT Assessment Patient needs continued PT services  PT Problem List Decreased strength;Decreased range of motion;Decreased activity tolerance;Decreased mobility;Decreased balance;Decreased knowledge of precautions;Decreased skin integrity;Pain       PT Treatment Interventions DME instruction;Gait training;Stair training;Functional mobility training;Therapeutic activities;Therapeutic exercise;Balance training;Neuromuscular re-education;Patient/family education    PT Goals (Current goals can be found in the Care Plan section)   Acute Rehab PT Goals Patient Stated Goal: Back to walking normally PT Goal Formulation: With patient Time For Goal Achievement: 02/19/20 Potential to Achieve Goals: Good    Frequency Min 3X/week   Barriers to discharge        Co-evaluation               AM-PAC PT "6 Clicks" Mobility  Outcome Measure Help needed turning from your back to your side while in a flat bed without using bedrails?: None Help needed moving from lying on your back to sitting on the side of a flat bed without using bedrails?: None Help needed moving to and from a bed to a chair (including a wheelchair)?: A Little Help needed standing up from a chair using your arms (e.g., wheelchair or bedside chair)?: A Little Help needed to walk in hospital room?: A Little Help needed climbing 3-5 steps with a railing? : A Lot 6 Click Score: 19    End of Session Equipment Utilized During Treatment: Gait belt Activity Tolerance: Patient tolerated treatment well Patient left: in chair;with call bell/phone within reach;with chair alarm set;with nursing/sitter in room Nurse Communication: Mobility status PT Visit Diagnosis: Other abnormalities of gait and mobility (R26.89)    Time: [id number] PT Time Calculation (min) (ACUTE ONLY): 24 min   Charges:   PT Evaluation $PT Eval Low Complexity: 1 Low PT Treatments $Gait Training: 8-22 mins        Roney Marion, PT  Acute Rehabilitation Services Pager [phone] Office [phone]   [place]  Burman Foster 02/05/2020, 12:24 PM

## 2020-02-05 NOTE — Progress Notes (Signed)
Orthopedic Tech Progress Note Patient Details:  Austin Dillon 1950/12/16 [id number]  Patient ID: Austin Dillon, male   DOB: Apr 09, 1951, 69 y.o.   MRN: [id number]   Austin Dillon 02/05/2020, 9:30 AMRouted brace order to [facility].

## 2020-02-05 NOTE — Progress Notes (Signed)
Inpatient Rehab Admissions:  Inpatient Rehab Consult received.  I met with patient and his wife at the bedside for rehabilitation assessment and to discuss goals and expectations of an inpatient rehab admission.  Note that patient mobilizing min guard, or better, with therapy and too high level for our program. Recommend f/u with lower level of care (SNF or home health, though wife declines SNF).  I will let CM know and CIR will sign off.   Signed: Shann Medal, PT, DPT Admissions Coordinator [phone] 02/05/20  2:18 PM

## 2020-02-06 LAB — CBC
HCT: 37.9 % — ABNORMAL LOW (ref 39.0–52.0)
Hemoglobin: 12.8 g/dL — ABNORMAL LOW (ref 13.0–17.0)
MCH: 33.4 pg (ref 26.0–34.0)
MCHC: 33.8 g/dL (ref 30.0–36.0)
MCV: 99 fL (ref 80.0–100.0)
Platelets: 178 10*3/uL (ref 150–400)
RBC: 3.83 MIL/uL — ABNORMAL LOW (ref 4.22–5.81)
RDW: 12.1 % (ref 11.5–15.5)
WBC: 9.1 10*3/uL (ref 4.0–10.5)
nRBC: 0 % (ref 0.0–0.2)

## 2020-02-06 LAB — BASIC METABOLIC PANEL
Anion gap: 9 (ref 5–15)
BUN: 7 mg/dL — ABNORMAL LOW (ref 8–23)
CO2: 27 mmol/L (ref 22–32)
Calcium: 8.9 mg/dL (ref 8.9–10.3)
Chloride: 103 mmol/L (ref 98–111)
Creatinine, Ser: 0.9 mg/dL (ref 0.61–1.24)
GFR calc Af Amer: >60 mL/min (ref >60)
GFR calc non Af Amer: >60 mL/min (ref >60)
Glucose, Bld: 122 mg/dL — ABNORMAL HIGH (ref 70–99)
Potassium: 3.7 mmol/L (ref 3.5–5.1)
Sodium: 139 mmol/L (ref 135–145)

## 2020-02-06 LAB — SURGICAL PATHOLOGY

## 2020-02-06 MED ORDER — VITAMIN D (ERGOCALCIFEROL) 1.25 MG (50000 UNIT) PO CAPS
50000.0000 [IU] | ORAL_CAPSULE | ORAL | Status: DC
  Administered 2020-02-06: 50000 [IU] via ORAL
  Filled 2020-02-06: qty 1

## 2020-02-06 MED ORDER — VITAMIN D 25 MCG (1000 UNIT) PO TABS
2000.0000 [IU] | ORAL_TABLET | Freq: Two times a day (BID) | ORAL | Status: DC
  Administered 2020-02-06 – 2020-02-07 (×3): 2000 [IU] via ORAL
  Filled 2020-02-06 (×3): qty 2

## 2020-02-06 NOTE — Progress Notes (Signed)
Pt educated on using dilaudid as a break through med. Pt still asked for dilaudid d/t 8 out of 10 pain and oxy not available at time.

## 2020-02-06 NOTE — Consult Note (Signed)
PV Navigator acknowledging consult. Chart reviewed.   69 y/o male admitted 02/04/20 for scheduled R BKA due to chronic septic arthritis R ankle. Pt says he was spraying Roundup around his yard when his foot got caught on a root when he fell and fractured his ankle. He says he then got a staph infection and wound would never heal. Dr Marcelino Scot has been following this patient.    POD#2 s/p R BKA. I was able to visit with patient in his hospital room. Patient sitting up in recliner chair with R BKA elevated. R knee immobilizer brace intact and VAC tubing visible coming from brace. Patient states he is "doing good", no complaints at present time. He states he and his wife discussed discharge plans and they wish to discharge home with home health services. He says his wife is available to help him at home as well as 2 brothers that live close by and are available prn. He does not wish to go to SNF. Patient says his wife is able to transport him to his appointments and he can use his RW to mobilize around the house without issues.   No other vascular history identified to warrant PV navigator at this time. Will remain available to patient should need arises. Contact information left with patient for questions or barriers.   Thank you,  Cletis Media RN BSN CWS [place] [phone]

## 2020-02-06 NOTE — Plan of Care (Signed)
  Problem: Education: Goal: Knowledge of General Education information will improve Description: Including pain rating scale, medication(s)/side effects and non-pharmacologic comfort measures Outcome: Progressing   Problem: Clinical Measurements: Goal: Will remain free from infection Outcome: Progressing   Problem: Pain Managment: Goal: General experience of comfort will improve Outcome: Progressing   Problem: Safety: Goal: Ability to remain free from injury will improve Outcome: Progressing   Problem: Skin Integrity: Goal: Risk for impaired skin integrity will decrease Outcome: Progressing   Problem: Education: Goal: Knowledge of the prescribed therapeutic regimen will improve Outcome: Progressing Goal: Ability to verbalize activity precautions or restrictions will improve Outcome: Progressing Goal: Understanding of discharge needs will improve Outcome: Progressing   Problem: Activity: Goal: Ability to perform//tolerate increased activity and mobilize with assistive devices will improve Outcome: Progressing   Problem: Clinical Measurements: Goal: Postoperative complications will be avoided or minimized Outcome: Progressing   Problem: Self-Care: Goal: Ability to meet self-care needs will improve Outcome: Progressing

## 2020-02-06 NOTE — TOC Transition Note (Addendum)
Transition of Care [facility]) - CM/SW Discharge Note   Patient Details  Name: MATTIX BADIA MRN: [id number] Date of Birth: 1951-05-18  Transition of Care [facility]) CM/SW Contact:  Sharin Mons, RN Phone Number: 02/06/2020, 2:15 PM   Clinical Narrative:      - s/p R BKA 02/05/2020. Hx of  chronic osteomyelitis,chronic septic arthritis and chronic pain of right ankle following open fracture dislocation of right ankle 03/2017 Pt will transition to home today with home health services [facility] 02/07/2020 with [facility]. Pt declined for CIR. No DME needs. Wife to provide transportation home    Final next level of care: [place] Barriers to Discharge: No Barriers Identified   Patient Goals and CMS Choice Patient states their goals for this hospitalization and ongoing recovery are:: to go home and get better   Choice offered to / list presented to : Patient  Discharge Placement        Discharge Plan and Services   Discharge Planning Services: CM Consult            DME Arranged: N/A DME Agency: NA       HH Arranged: PT, OT HH Agency: [place] Date [place]: 02/06/20 Time [place]: [place] Representative spoke with at [place]: [place] Determinants of Health ([place]) Interventions     Readmission Risk Interventions No flowsheet data found.

## 2020-02-06 NOTE — Progress Notes (Addendum)
Physical Therapy Treatment Patient Details Name: Austin Dillon MRN: [id number] DOB: [birth year] Today's Date: 02/06/2020    History of Present Illness Austin Dillon is an 69 y.o. male that was seen initially in consultation by the orthopedic trauma service on 10/02/2019.  He sustained a complex open right ankle fracture dislocation 03/26/2017.  Had multiple procedures for this including external fixation and ORIF.  Patient has had wound healing issues essentially from the get go. 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 Pt in bed with limb protector and distal end elevated on foam for knee extension.  Pt able to recall part of HEP, educated for and practiced all with handout provided. Pt able to demonstrate don/doff of limb protector and progressing all activity this session. D/C home with family assist appropriate.    Follow Up Recommendations  Home health PT     Equipment Recommendations  None recommended by PT    Recommendations for Other Services       Precautions / Restrictions Precautions Precaution Comments: Keep R knee straight at rest, VAC    Mobility  Bed Mobility Overal bed mobility: Modified Independent                Transfers Overall transfer level: Needs assistance   Transfers: Sit to/from Stand Sit to Stand: Min guard         General transfer comment: cues for hand placement  Ambulation/Gait Ambulation/Gait assistance: Min guard Gait Distance (Feet): 40 Feet Assistive device: Rolling walker (2 wheeled) Gait Pattern/deviations: Step-to pattern   Gait velocity interpretation: >2.62 ft/sec, indicative of community ambulatory General Gait Details: pt with good sequence and safety but declined further distance   Stairs             Wheelchair Mobility    Modified Rankin (Stroke Patients Only)       Balance Overall balance assessment:  Needs assistance   Sitting balance-Leahy Scale: Good     Standing balance support: Bilateral upper extremity supported Standing balance-Leahy Scale: Poor                              Cognition Arousal/Alertness: Awake/alert Behavior During Therapy: WFL for tasks assessed/performed Overall Cognitive Status: Within Functional Limits for tasks assessed                                        Exercises Amputee Exercises Hip Extension: AROM;Right;Standing;15 reps Hip ABduction/ADduction: AROM;Right;Seated;15 reps Knee Extension: AROM;Right;Seated;15 reps Straight Leg Raises: AROM;Right;Seated;15 reps    General Comments        Pertinent Vitals/Pain Pain Assessment: 0-10 Pain Score: 4  Pain Location: residual limb after activity Pain Descriptors / Indicators: Sore Pain Intervention(s): Limited activity within patient's tolerance;Repositioned;Monitored during session    Home Living                      Prior Function            PT Goals (current goals can now be found in the care plan section) Progress towards PT goals: Progressing toward goals    Frequency    Min 3X/week      PT Plan Discharge plan needs to be updated  Co-evaluation              AM-PAC PT "6 Clicks" Mobility   Outcome Measure  Help needed turning from your back to your side while in a flat bed without using bedrails?: None Help needed moving from lying on your back to sitting on the side of a flat bed without using bedrails?: None Help needed moving to and from a bed to a chair (including a wheelchair)?: A Little Help needed standing up from a chair using your arms (e.g., wheelchair or bedside chair)?: A Little Help needed to walk in hospital room?: A Little Help needed climbing 3-5 steps with a railing? : A Lot 6 Click Score: 19    End of Session Equipment Utilized During Treatment: Gait belt Activity Tolerance: Patient tolerated treatment  well Patient left: in chair;with call bell/phone within reach;with chair alarm set Nurse Communication: Mobility status PT Visit Diagnosis: Other abnormalities of gait and mobility (R26.89)     Time: [id number] PT Time Calculation (min) (ACUTE ONLY): 16 min  Charges:  $Therapeutic Activity: 8-22 mins                     Jru Pense P, PT Acute Rehabilitation Services Pager: [phone] Office: [place] 02/06/2020, 1:46 PM

## 2020-02-06 NOTE — Plan of Care (Signed)

## 2020-02-06 NOTE — Progress Notes (Signed)
Orthopaedic Trauma Service Progress Note  Patient ID: GAD EMGE MRN: [id number] DOB/AGE: Feb 08, 1951 69 y.o.  Subjective:  Doing well  No acute issues Pain controlled Mobilizing too well for CIR, pt does not want snf  No phantom pain    ROS  As above  Objective:   VITALS:   Vitals:   02/05/20 2007 02/06/20 0335 02/06/20 0800 02/06/20 1313  BP: 140/68 (!) 148/76 (!) 167/77 (!) 157/73  Pulse: 77 78 76 75  Resp: 18 15 16 16   Temp: 98.8 F (37.1 C) 98.7 F (37.1 C) 98.9 F (37.2 C) 98.3 F (36.8 C)  TempSrc: Oral Oral Oral Oral  SpO2: 94% 95% 95% 97%  Weight:      Height:        Estimated body mass index is 27.37 kg/m as calculated from the following:   Height as of this encounter: 5\' 8"  (1.727 m).   Weight as of this encounter: 81.6 kg.   Intake/Output      03/03 0701 - 03/04 0700 03/04 0701 - 03/05 0700   P.O. 720 240   I.V. (mL/kg)     IV Piggyback     Total Intake(mL/kg) 720 (8.8) 240 (2.9)   Urine (mL/kg/hr) 850 (0.4) 0 (0)   Drains  0   Stool 0 0   Blood     Total Output 850 0   Net -130 +240        Urine Occurrence 1 x 1 x   Stool Occurrence 1 x 1 x     LABS  Results for orders placed or performed during the hospital encounter of 02/04/20 (from the past 24 hour(s))  Basic metabolic panel     Status: Abnormal   Collection Time: 02/06/20  4:00 AM  Result Value Ref Range   Sodium 139 135 - 145 mmol/L   Potassium 3.7 3.5 - 5.1 mmol/L   Chloride 103 98 - 111 mmol/L   CO2 27 22 - 32 mmol/L   Glucose, Bld 122 (H) 70 - 99 mg/dL   BUN 7 (L) 8 - 23 mg/dL   Creatinine, Ser 0.90 0.61 - 1.24 mg/dL   Calcium 8.9 8.9 - 10.3 mg/dL   GFR calc non Af Amer >60 >60 mL/min   GFR calc Af Amer >60 >60 mL/min   Anion gap 9 5 - 15  CBC     Status: Abnormal   Collection Time: 02/06/20  4:00 AM  Result Value Ref Range   WBC 9.1 4.0 - 10.5 K/uL   RBC 3.83 (L) 4.22 - 5.81 MIL/uL   Hemoglobin 12.8 (L) 13.0 - 17.0 g/dL   HCT 37.9 (L) 39.0 - 52.0 %   MCV 99.0 80.0 - 100.0 fL   MCH 33.4 26.0 - 34.0 pg   MCHC 33.8 30.0 - 36.0 g/dL   RDW 12.1 11.5 - 15.5 %   Platelets 178 150 - 400 K/uL   nRBC 0.0 0.0 - 0.2 %     PHYSICAL EXAM:   Gen: sitting up in bed, pleasant appearing, NAD, appears well  Lungs: clear anterior fields Cardiac: regular Abd: + BS Ext:       Right Lower Extremity              Dressing c/d/i  stump protector in place   Vac removed  Incision looks great  Small blister on skin flap distally   Outstanding knee ROM    Assessment/Plan: 2 Days Post-Op   Principal Problem:   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[place]), chronic  Active Problems:   Depression with anxiety   PTSD (post-traumatic stress disorder)   Alzheimer disease ([place])   Chronic osteomyelitis of right lower leg ([facility])   Nicotine dependence   Anti-infectives (From admission, onward)   Start     Dose/Rate Route Frequency Ordered Stop   02/04/20 1600  ceFAZolin (ANCEF) IVPB 2g/100 mL premix     2 g 200 mL/hr over 30 Minutes Intravenous Every 8 hours 02/04/20 1211 02/04/20 2348   02/04/20 1002  vancomycin (VANCOCIN) powder  Status:  Discontinued       As needed 02/04/20 1002 02/04/20 1054   02/04/20 0645  ceFAZolin (ANCEF) IVPB 2g/100 mL premix     2 g 200 mL/hr over 30 Minutes Intravenous On call to O.R. 02/04/20 [id number] 02/04/20 0827    .  POD/HD#: 40  69 year old male with chronic osteomyelitis,chronic septic arthritis and chronic pain of right ankle following open fracture dislocation of right ankle 03/2017   -Chronic osteomyelitis, chronic septic arthritis and chronic pain right ankle s/p R BKA              continue with therapies  Dressing changed today   Continue with stump shrinker and protector  Ice and elevate   Plan for dc home tomorrow  + vitamin c for wound healing      - Pain management:             continue with current regimen               Multimodal analgesia             May consider adding gabapentin or lyrica but will hold for now                - ABL anemia/Hemodynamics           stable    - Medical issues              Home meds   + vitamin d deficiency    Supplement    - DVT/PE prophylaxis:             Lovenox x 21 days    - ID:              Perioperative antibiotics   - Activity:             therapy    - FEN/GI prophylaxis/Foley/Lines:            reg diet    - Impediments to fracture/wound healing:             Nicotine use             ?  Compliance with wound care   - Dispo:             therapy              Dressing again tomorrow   Remain concerned about pt and wife's ability to perform wound care/limb care due memory issues  Mobilizing too well for CIR, does not want snf    Home with [place], PA-C [phone] (C) 02/06/2020, 2:19  PM  [facility] [place] [address] [phone] (F)

## 2020-02-07 MED ORDER — ASCORBIC ACID 1000 MG PO TABS: 1000.0000 mg | ORAL_TABLET | Freq: Every day | ORAL | 0 refills | Status: DC

## 2020-02-07 MED ORDER — OXYCODONE-ACETAMINOPHEN 5-325 MG PO TABS: 1.0000 | ORAL_TABLET | Freq: Four times a day (QID) | ORAL | 0 refills | Status: AC | PRN

## 2020-02-07 MED ORDER — VITAMIN D 125 MCG (5000 UT) PO CAPS: 1.0000 | ORAL_CAPSULE | Freq: Every day | ORAL | 3 refills | Status: DC

## 2020-02-07 MED ORDER — RIVAROXABAN 10 MG PO TABS: 10.0000 mg | ORAL_TABLET | Freq: Every day | ORAL | 0 refills | Status: DC

## 2020-02-07 MED ORDER — VITAMIN D (ERGOCALCIFEROL) 1.25 MG (50000 UNIT) PO CAPS: 50000.0000 [IU] | ORAL_CAPSULE | ORAL | 1 refills | Status: DC

## 2020-02-07 NOTE — Progress Notes (Signed)
Orthopaedic Trauma Service Progress Note  Patient ID: Austin Dillon MRN: [id number] DOB/AGE: 05-25-1951 69 y.o.  Subjective:  Doing well No new issues Pain controlled Wants to go home  New shrinker applied to R BKA   ROS As above  Objective:   VITALS:   Vitals:   02/06/20 1313 02/06/20 2009 02/07/20 0429 02/07/20 0737  BP: (!) 157/73 (!) 149/76 (!) 152/80 (!) 157/68  Pulse: 75 61 79 77  Resp: 16 16 18 16   Temp: 98.3 F (36.8 C) 98.6 F (37 C) 99.1 F (37.3 C) 99.5 F (37.5 C)  TempSrc: Oral Oral Oral Oral  SpO2: 97% 97% 93% 95%  Weight:      Height:        Estimated body mass index is 27.37 kg/m as calculated from the following:   Height as of this encounter: 5\' 8"  (1.727 m).   Weight as of this encounter: 81.6 kg.   Intake/Output      03/04 0701 - 03/05 0700 03/05 0701 - 03/06 0700   P.O. 240 120   Total Intake(mL/kg) 240 (2.9) 120 (1.5)   Urine (mL/kg/hr) 0 (0)    Drains 0    Stool 0    Total Output 0    Net +240 +120        Urine Occurrence 2 x 1 x   Stool Occurrence 2 x 1 x     LABS  No results found for this or any previous visit (from the past 24 hour(s)).   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[place]), chronic  Active Problems:   Depression with anxiety   PTSD (post-traumatic stress disorder)   Alzheimer disease ([place])   Chronic osteomyelitis of right lower leg ([place])  Nicotine dependence   Anti-infectives (From admission, onward)   Start     Dose/Rate Route Frequency Ordered Stop   02/04/20 1600  ceFAZolin (ANCEF) IVPB 2g/100 mL premix     2 g 200 mL/hr over 30 Minutes Intravenous Every 8 hours 02/04/20 1211 02/04/20 2348   02/04/20 1002  vancomycin (VANCOCIN) powder  Status:  Discontinued       As needed 02/04/20 1002 02/04/20 1054   02/04/20 0645  ceFAZolin (ANCEF) IVPB 2g/100 mL premix     2 g 200 mL/hr over 30 Minutes Intravenous On call to O.R. 02/04/20 [id number] 02/04/20 0827    .  POD/HD#: 36  69 year old male with chronic osteomyelitis,chronic septic arthritis and chronic pain of right ankle following open fracture dislocation of right ankle 03/2017   -Chronic osteomyelitis, chronic septic arthritis and chronic pain right ankle s/p R BKA              continue with  therapies             Dressing changed today              Continue with stump shrinker and protector             Ice and elevate              DC home today             + vitamin c for wound healing      - Pain management:             continue with current regimen              Multimodal analgesia                - ABL anemia/Hemodynamics           stable    - Medical issues              Home meds               + vitamin d deficiency                          Supplement    - DVT/PE prophylaxis:             Lovenox x 21 days    - ID:              Perioperative antibiotics   - Activity:             therapy    - FEN/GI prophylaxis/Foley/Lines:            reg diet    - Impediments to fracture/wound healing:             Nicotine use             ?  Compliance with wound care   - Dispo:             DC home today  Follow-up on 02/12/2020 for wound check   Jari Pigg, PA-C [phone] (C) 02/07/2020, 11:49 AM  [facility] [place]  [id number] [phone] (F)

## 2020-02-07 NOTE — Progress Notes (Signed)
Physical Therapy Treatment Patient Details Name: Austin Dillon MRN: [id number] DOB: 17-May-1951 Today's Date: 02/07/2020    History of Present Illness Austin Dillon is an 69 y.o. male that was seen initially in consultation by the orthopedic trauma service on 10/02/2019.  He sustained a complex open right ankle fracture dislocation 03/26/2017.  Had multiple procedures for this including external fixation and ORIF.  Patient has had wound healing issues essentially from the get go. Now s/p R BKA due to Chronic septic arthritis right ankle joint with tibiotalar arthritis    PT Comments    Pt pleasant stating need for BM but unable to void. Pt with limb protector off on arrival and requires min assist to don. Pt able to recall HEP and perform without assist. Increased gait tolerance this session but remains hesitant to increase distance.    Follow Up Recommendations  Home health PT     Equipment Recommendations  None recommended by PT    Recommendations for Other Services       Precautions / Restrictions Precautions Precautions: Fall Precaution Comments: Keep R knee straight at rest Restrictions Weight Bearing Restrictions: Yes RLE Weight Bearing: Non weight bearing    Mobility  Bed Mobility               General bed mobility comments: pt sitting on BSC on arrival with transfer BSC>EOB but no further bed mobility this session  Transfers Overall transfer level: Needs assistance Equipment used: Rolling walker (2 wheeled) Transfers: Sit to/from [employer] Sit to Stand: Min guard   Squat pivot transfers: Min guard     General transfer comment: cues for hand placement and to reach for surface with sitting. Pt performed squat pivot BSC to bed with cues for safety and slightly impulsive with movement  Ambulation/Gait Ambulation/Gait assistance: Min guard Gait Distance (Feet): 80 Feet Assistive device: Rolling walker (2 wheeled) Gait Pattern/deviations:  Step-to pattern   Gait velocity interpretation: 1.31 - 2.62 ft/sec, indicative of limited community ambulator General Gait Details: pt with good safety and sequence with increased gait tolerance this session   Stairs             Wheelchair Mobility    Modified Rankin (Stroke Patients Only)       Balance Overall balance assessment: Needs assistance   Sitting balance-Leahy Scale: Good     Standing balance support: Bilateral upper extremity supported Standing balance-Leahy Scale: Poor                              Cognition Arousal/Alertness: Awake/alert Behavior During Therapy: WFL for tasks assessed/performed   Area of Impairment: Memory                     Memory: Decreased short-term memory         General Comments: pt asking for pain medicine after having received it and education that not time yet      Exercises Amputee Exercises Hip Extension: AROM;Right;Standing;15 reps Hip ABduction/ADduction: AROM;Right;15 reps;Standing Knee Extension: AROM;Right;Seated;15 reps Straight Leg Raises: AROM;Right;Seated;15 reps    General Comments        Pertinent Vitals/Pain Pain Score: 4  Pain Location: end of residual limb Pain Descriptors / Indicators: Sore Pain Intervention(s): Limited activity within patient's tolerance;Premedicated before session;Repositioned    Home Living  Prior Function            PT Goals (current goals can now be found in the care plan section) Progress towards PT goals: Progressing toward goals    Frequency           PT Plan Current plan remains appropriate    Co-evaluation              AM-PAC PT "6 Clicks" Mobility   Outcome Measure  Help needed turning from your back to your side while in a flat bed without using bedrails?: None Help needed moving from lying on your back to sitting on the side of a flat bed without using bedrails?: None Help needed moving to  and from a bed to a chair (including a wheelchair)?: A Little Help needed standing up from a chair using your arms (e.g., wheelchair or bedside chair)?: A Little Help needed to walk in hospital room?: A Little Help needed climbing 3-5 steps with a railing? : A Lot 6 Click Score: 19    End of Session Equipment Utilized During Treatment: Gait belt;Other (comment)(limb protector) Activity Tolerance: Patient tolerated treatment well Patient left: in chair;with call bell/phone within reach;with chair alarm set Nurse Communication: Mobility status PT Visit Diagnosis: Other abnormalities of gait and mobility (R26.89)     Time: [id number] PT Time Calculation (min) (ACUTE ONLY): 16 min  Charges:  $Gait Training: 8-22 mins                     [place], PT Acute Rehabilitation Services Pager: [phone] Office: [phone]    Sandy Salaam Doreene Forrey 02/07/2020, 12:54 PM

## 2020-02-07 NOTE — Progress Notes (Cosign Needed)
    Durable Medical Equipment  (From admission, onward)         Start     Ordered   02/07/20 1216  For home use only DME 3 n 1  Once     02/07/20 1215   02/07/20 1214  For home use only DME Walker  Once    Question:  Patient needs a walker to treat with the following condition  Answer:  Weakness   02/07/20 1215   02/07/20 1209  For home use only DME standard manual wheelchair with seat cushion  Once    Comments: Patient suffers from [place] which impairs their ability to perform daily activities like walking in the home.  A cane will not resolve issue with performing activities of daily living. A transport wheelchair will allow patient to safely perform daily activities. Patient can safely propel the wheelchair in the home or has a caregiver who can provide assistance. Pt has a caregiver who can provide assistance- push transport chair in the home. Length of need 12 months.  Foot rest, transport chair.   02/07/20 1215

## 2020-02-07 NOTE — Progress Notes (Signed)
Orthopedic Tech Progress Note Patient Details:  GEVORG UTRERA 01/10/1951 [id number] Called [facility] to come out and do EDUCATION to [place] Patient ID: REA PARCHEM, male   DOB: 1951-02-10, 69 y.o.   MRN: [id number]   Janit Pagan 02/07/2020, 12:34 PM

## 2020-02-07 NOTE — Discharge Summary (Addendum)
Orthopaedic Trauma Service (OTS) Discharge Summary   Patient ID: Austin Dillon MRN: [id number] DOB/AGE: Nov 07, 1951 69 y.o.  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 Marcelino Scot  AMPUTATION BELOW KNEE (Right, Mid level)  Discharged Condition: good  Hospital Course:   69 year old male well-known to the orthopedic trauma service for second opinion regarding chronic injury.  Patient sustained an open right ankle fracture dislocation was treated in a staged fashion unfortunately he developed chronic infection which led to chronic septic ankle arthritis.  Multiple options were reviewed with the patient ultimately we all felt that below-knee amputation would provide him the best opportunity to return to a good functional quality of life.  Patient was brought to the OR on the day noted above where the right below-knee amputation was performed.  After surgery was transferred to PACU for recovery from anesthesia and then  transferred to the orthopedic floor for observation, pain control and therapies.   Patient started working with therapies on postoperative day #1.  He did very well during his hospital stay.  No major issues were noted.  Patient is actually mobilizing too well to be considered for inpatient rehab.  Patient did not want to consider going to a nursing facility.  Again no issues were noted.  Patient was deemed to be stable for discharge on 02/07/2020.  We did perform dressing change on postoperative day #2 which included removal of his incisional wound VAC placement of the stump shrinker.  Patient tolerated this very well.  His pain is adequately controlled with oral pain medications.  Patient was covered with Lovenox for DVT PE prophylaxis  Consults: None  Significant Diagnostic Studies: labs:   Results for Austin Dillon, Austin Dillon (MRN [id number]) as of 02/24/2020 14:29  Ref. Range 02/05/2020 03:55 02/06/2020 04:00  Sodium Latest Ref Range: 135 - 145 mmol/L 139 139  Potassium Latest Ref Range: 3.5 - 5.1 mmol/L 3.8 3.7  Chloride Latest Ref Range: 98 - 111 mmol/L 106 103  CO2 Latest Ref Range: 22 - 32 mmol/L 26 27  Glucose Latest Ref Range: 70 - 99 mg/dL 122 (H) 122 (H)  BUN Latest Ref Range: 8 - 23 mg/dL 8 7 (L)  Creatinine Latest Ref Range: 0.61 - 1.24 mg/dL 0.99 0.90  Calcium Latest Ref Range: 8.9 - 10.3 mg/dL 8.7 (L) 8.9  Anion gap Latest Ref Range: 5 - 15  7 9   GFR, Est Non African American Latest Ref Range: >60 mL/min >60 >60  GFR, Est African American Latest Ref Range: >60 mL/min >60 >60  Vitamin D, 25-Hydroxy  Latest Ref Range: 30 - 100 ng/mL 10.23 (L)   WBC Latest Ref Range: 4.0 - 10.5 K/uL 8.9 9.1  RBC Latest Ref Range: 4.22 - 5.81 MIL/uL 4.01 (L) 3.83 (L)  Hemoglobin Latest Ref Range: 13.0 - 17.0 g/dL 13.2 12.8 (L)  HCT Latest Ref Range: 39.0 - 52.0 % 40.3 37.9 (L)  MCV Latest Ref Range: 80.0 - 100.0 fL 100.5 (H) 99.0  MCH Latest Ref Range: 26.0 - 34.0 pg 32.9 33.4  MCHC Latest Ref Range: 30.0  - 36.0 g/dL 32.8 33.8  RDW Latest Ref Range: 11.5 - 15.5 % 12.2 12.1  Platelets Latest Ref Range: 150 - 400 K/uL 190 178  nRBC Latest Ref Range: 0.0 - 0.2 % 0.0 0.0   Treatments: IV hydration, antibiotics: Ancef, analgesia: acetaminophen, Dilaudid and oxy ir , anticoagulation: LMW heparin, therapies: PT, OT and RN and surgery: as above  Discharge Exam:          Orthopaedic Trauma Service Progress Note   Patient ID: Austin Dillon MRN: [id number] DOB/AGE: [birth year] 69 y.o.   Subjective:   Doing well No new issues Pain controlled Wants to go home   New shrinker applied to R BKA    ROS As above   Objective:    VITALS:         Vitals:    02/06/20 1313 02/06/20 2009 02/07/20 0429 02/07/20 0737  BP: (!) 157/73 (!) 149/76 (!) 152/80 (!) 157/68  Pulse: 75 61 79 77  Resp: 16 16 18 16   Temp: 98.3 F (36.8 C) 98.6 F (37 C) 99.1 F (37.3 C) 99.5 F (37.5 C)  TempSrc: Oral Oral Oral Oral  SpO2: 97% 97% 93% 95%  Weight:          Height:              Estimated body mass index is 27.37 kg/m as calculated from the following:   Height as of this encounter: 5\' 8"  (1.727 m).   Weight as of this encounter: 81.6 kg.     Intake/Output      03/04 0701 - 03/05 0700 03/05 0701 - 03/06 0700   P.O. 240 120   Total Intake(mL/kg) 240 (2.9) 120 (1.5)   Urine (mL/kg/hr) 0 (0)    Drains 0    Stool 0    Total Output 0    Net +240 +120        Urine Occurrence 2 x 1 x   Stool Occurrence 2 x 1 x      LABS   Lab Results Last 24 Hours  No results found for this or any previous visit (from the past 24 hour(s)).       PHYSICAL EXAM:  Gen: sitting up in bed, pleasant appearing, NAD, appears well  Lungs: clear anterior fields Cardiac: regular Abd: + BS Ext:       Right Lower Extremity              dressing changed again             Looks good             Scant drainage             shrinker reapplied             Very good quadriceps control             Excellent knee  range of motion  Gets to full extension of the right knee to 90 degrees of flexion             Stump appears to be well perfused and is warm       Assessment/Plan: 3 Days Post-Op    Principal Problem:   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[place]), chronic  Active Problems:   Depression with anxiety   PTSD (post-traumatic stress disorder)   Alzheimer disease ([facility])   Chronic osteomyelitis of right lower leg ([facility])   Nicotine dependence                Anti-infectives (From admission, onward)      Start     Dose/Rate Route Frequency Ordered Stop    02/04/20 1600   ceFAZolin (ANCEF) IVPB 2g/100 mL premix     2 g 200 mL/hr over 30 Minutes Intravenous Every 8 hours 02/04/20 1211 02/04/20 2348    02/04/20 1002   vancomycin (VANCOCIN) powder  Status:  Discontinued         As needed 02/04/20 1002 02/04/20 1054    02/04/20 0645   ceFAZolin (ANCEF) IVPB 2g/100 mL premix     2 g 200 mL/hr over 30 Minutes Intravenous On call to O.R. 02/04/20 [id number] 02/04/20 0827       .   POD/HD#: 33   69 year old male with chronic osteomyelitis,chronic septic arthritis and chronic pain of right ankle following open fracture dislocation of right ankle 03/2017   -Chronic osteomyelitis, chronic septic arthritis and chronic pain right ankle s/p R BKA              continue with therapies             Dressing changed today              Continue with stump shrinker and protector             Ice and elevate              DC home today             + vitamin c for wound healing      - Pain management:             continue with current regimen              Multimodal analgesia                - ABL anemia/Hemodynamics           stable    - Medical issues              Home meds               + vitamin d deficiency                          Supplement    - DVT/PE prophylaxis:             Lovenox x 21 days    - ID:              Perioperative antibiotics   - Activity:             therapy    -  FEN/GI prophylaxis/Foley/Lines:            reg diet    - Impediments to fracture/wound healing:             Nicotine use             ?  Compliance with wound care   - Dispo:             DC home today             Follow-up on 02/12/2020 for wound check       Disposition:      Allergies as of 02/07/2020       Reactions   Nortriptyline Other (See Comments)   Severe mood swings   Aricept [donepezil] Other (See Comments)   Makes the patient look like a "mummy" (too sedating)        Medication List     STOP taking these medications    amoxicillin-clavulanate 875-125 MG tablet Commonly known as: Augmentin   aspirin EC 81 MG tablet   feeding supplement (ENSURE ENLIVE) Liqd       TAKE these medications    alprazolam 2 MG tablet Commonly known as: XANAX Take 2 mg by mouth 3 (three) times daily.   ascorbic acid 1000 MG tablet Commonly known as: VITAMIN C Take 1 tablet (1,000 mg total) by mouth daily. Start taking on: February 08, 2020   lamoTRIgine 100 MG tablet Commonly known as: LAMICTAL Take 150 mg by mouth in the morning and at bedtime.   naproxen sodium 220 MG tablet Commonly known as: ALEVE Take 220 mg by mouth 2 (two) times daily as needed (pain).   OLANZapine 2.5 MG tablet Commonly known as: ZYPREXA Take 2.5 mg by mouth in the morning and at bedtime.   oxyCODONE-acetaminophen 5-325 MG tablet Commonly known as: Percocet Take 1-2 tablets by mouth every 6 (six) hours as needed for moderate pain or severe pain.   rivaroxaban 10 MG Tabs tablet Commonly known as: Xarelto Take 1 tablet (10 mg total) by mouth daily.   Vitamin D (Ergocalciferol) 1.25 MG (50000 UNIT) Caps capsule Commonly known as: DRISDOL Take 1 capsule (50,000 Units total) by mouth every 7 (seven) days. Start taking on: February 13, 2020   Vitamin D 125 MCG (5000 UT) Caps Take 1 capsule by mouth daily.       Follow-up Information     [facility].   Why: Home  health services arranged Contact information: [place] [address] [phone]         Altamese Venice, MD. Schedule an appointment as soon as possible for a visit on 02/12/2020.   Specialty: Orthopedic Surgery Contact information: [place] [address] [phone]            Discharge Instructions and Plan:  -Chronic osteomyelitis, chronic septic arthritis and chronic pain right ankle s/p R BKA              continue with therapies             Dressing changed today    Next dressing change in 48 hours   Reviewed with pt and wife              Continue with stump shrinker and protector             Ice and elevate              DC home today             + vitamin c for wound healing      - DVT/PE prophylaxis:             Lovenox x 21 days   Signed:  Jari Pigg,  PA-C [phone] (C) 02/07/2020, 12:06 PM  [facility] [place] [address] [phone] (F)

## 2020-02-07 NOTE — Discharge Instructions (Signed)
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  Wear stump protector when up moving around.  Do not rest with a pillow under your knee to prevent a contracture.  Always make sure you are using your walker  Wound Care: Daily wound care starting on 02/09/2020.  See below  Discharge Wound Care Instructions  Do NOT apply any ointments, solutions or lotions to pin sites or surgical wounds.  These prevent needed drainage and even though solutions like hydrogen peroxide kill bacteria, they also damage cells lining the pin sites that help fight infection.  Applying lotions or ointments can keep the wounds moist and can cause them to breakdown and open up as well. This can increase the risk for infection. When in doubt call the office.  Surgical incisions should be dressed daily starting on 02/09/2020.  Can do dressing changes every other day  If any drainage is noted, use one layer of adaptic/mepitel (silicon sheet with holes in it), then gauze, and stump shrinker.  Once the wound is completely dry just place the stump shrinker directly against the skin.   DVT/PE prophylaxis: Xarelto 10 mg po daily x 21 days   Diet: as you were eating previously.  Can use over the counter stool softeners and bowel preparations, such as Miralax, to help with bowel movements.  Narcotics can be constipating.  Be sure to drink plenty of fluids  PAIN MEDICATION USE AND EXPECTATIONS  You have likely been given narcotic medications to help control your pain.  After a traumatic event that results in an fracture (broken bone) with or without surgery, it is ok to use narcotic pain medications to help control one's pain.  We understand that everyone responds to pain differently  and each individual patient will be evaluated on a regular basis for the continued need for narcotic medications. Ideally, narcotic medication use should last no more than 6-8 weeks (coinciding with fracture healing).   As a patient it is your responsibility as well to monitor narcotic medication use and report the amount and frequency you use these medications when you come to your office visit.   We would also advise that if you are using narcotic medications, you should take a dose prior to therapy to maximize you participation.  IF YOU ARE ON NARCOTIC MEDICATIONS IT IS NOT PERMISSIBLE TO OPERATE A MOTOR VEHICLE (MOTORCYCLE/CAR/TRUCK/MOPED) OR HEAVY MACHINERY DO NOT MIX NARCOTICS WITH OTHER CNS (CENTRAL NERVOUS SYSTEM) DEPRESSANTS SUCH AS ALCOHOL   STOP SMOKING OR USING NICOTINE PRODUCTS!!!!  As discussed nicotine severely impairs your body's ability to heal surgical and traumatic wounds but also impairs bone healing.  Wounds and bone heal by forming microscopic blood vessels (angiogenesis) and nicotine is a vasoconstrictor (essentially, shrinks blood vessels).  Therefore, if vasoconstriction occurs to these microscopic blood vessels they essentially disappear and are unable to deliver necessary nutrients to the healing tissue.  This is one modifiable factor that you can do to dramatically increase your chances of healing your injury.    (This means no smoking, no nicotine gum, patches, etc)   ICE AND ELEVATE INJURED/OPERATIVE EXTREMITY  Using ice and elevating the injured extremity above your heart can help with swelling and pain control.  Icing in a pulsatile fashion, such as 20 minutes on and 20 minutes off, can  be followed.    Do not place ice directly on skin. Make sure there is a barrier between to skin and the ice pack.    Using frozen items such as frozen peas works well as the conform nicely to the are that needs to be iced.  USE AN ACE WRAP OR TED HOSE FOR SWELLING CONTROL  In  addition to icing and elevation, Ace wraps or TED hose are used to help limit and resolve swelling.  It is recommended to use Ace wraps or TED hose until you are informed to stop.    When using Ace Wraps start the wrapping distally (farthest away from the body) and wrap proximally (closer to the body)   Example: If you had surgery on your leg or thing and you do not have a splint on, start the ace wrap at the toes and work your way up to the thigh        If you had surgery on your upper extremity and do not have a splint on, start the ace wrap at your fingers and work your way up to the upper arm  IF YOU ARE IN A SPLINT OR CAST DO NOT [place]   If your splint gets wet for any reason please contact the office immediately. You may shower in your splint or cast as long as you keep it dry.  This can be done by wrapping in a cast cover or garbage back (or similar)  Do Not stick any thing down your splint or cast such as pencils, money, or hangers to try and scratch yourself with.  If you feel itchy take benadryl as prescribed on the bottle for itching  IF YOU ARE IN A CAM BOOT (BLACK BOOT)  You may remove boot periodically. Perform daily dressing changes as noted below.  Wash the liner of the boot regularly and wear a sock when wearing the boot. It is recommended that you sleep in the boot until told otherwise    Call office for the following:  Temperature greater than 101F  Persistent nausea and vomiting  Severe uncontrolled pain  Redness, tenderness, or signs of infection (pain, swelling, redness, odor or green/yellow discharge around the site)  Difficulty breathing, headache or visual disturbances  Hives  Persistent dizziness or light-headedness  Extreme fatigue  Any other questions or concerns you may have after discharge  In an emergency, call 911 or go to an Emergency Department at a nearby hospital    [place]:  [phone]   VISIT OUR WEBSITE FOR ADDITIONAL INFORMATION: [email or web address]

## 2020-02-07 NOTE — Plan of Care (Signed)
  Problem: Education: Goal: Knowledge of General Education information will improve Description: Including pain rating scale, medication(s)/side effects and non-pharmacologic comfort measures Outcome: Progressing   Problem: Health Behavior/Discharge Planning: Goal: Ability to manage health-related needs will improve Outcome: Progressing   Problem: Clinical Measurements: Goal: Will remain free from infection Outcome: Progressing   Problem: Elimination: Goal: Will not experience complications related to bowel motility Outcome: Progressing   Problem: Pain Managment: Goal: General experience of comfort will improve Outcome: Progressing   Problem: Safety: Goal: Ability to remain free from injury will improve Outcome: Progressing   Problem: Skin Integrity: Goal: Risk for impaired skin integrity will decrease Outcome: Progressing   Problem: Education: Goal: Knowledge of the prescribed therapeutic regimen will improve Outcome: Progressing Goal: Ability to verbalize activity precautions or restrictions will improve Outcome: Progressing Goal: Understanding of discharge needs will improve Outcome: Progressing   Problem: Clinical Measurements: Goal: Postoperative complications will be avoided or minimized Outcome: Progressing   Problem: Pain Management: Goal: Pain level will decrease with appropriate interventions Outcome: Progressing   Problem: Skin Integrity: Goal: Demonstration of wound healing without infection will improve Outcome: Progressing

## 2020-03-25 ENCOUNTER — Ambulatory Visit: Payer: Medicare Other | Admitting: Infectious Disease

## 2021-07-15 ENCOUNTER — Other Ambulatory Visit: Payer: Self-pay

## 2021-07-15 ENCOUNTER — Emergency Department: Payer: Medicare Other

## 2021-07-15 ENCOUNTER — Emergency Department
Admission: EM | Admit: 2021-07-15 | Discharge: 2021-07-15 | Disposition: A | Discharge status: Home / Self Care | Payer: Medicare Other | Attending: Emergency Medicine | Admitting: Emergency Medicine

## 2021-07-15 DIAGNOSIS — W19XXXA Unspecified fall, initial encounter: Secondary | ICD-10-CM

## 2021-07-15 DIAGNOSIS — S43015A Anterior dislocation of left humerus, initial encounter: Secondary | ICD-10-CM | POA: Insufficient documentation

## 2021-07-15 DIAGNOSIS — G309 Alzheimer's disease, unspecified: Secondary | ICD-10-CM | POA: Insufficient documentation

## 2021-07-15 DIAGNOSIS — F0390 Unspecified dementia without behavioral disturbance: Secondary | ICD-10-CM | POA: Insufficient documentation

## 2021-07-15 DIAGNOSIS — W010XXA Fall on same level from slipping, tripping and stumbling without subsequent striking against object, initial encounter: Secondary | ICD-10-CM | POA: Insufficient documentation

## 2021-07-15 DIAGNOSIS — F17210 Nicotine dependence, cigarettes, uncomplicated: Secondary | ICD-10-CM | POA: Diagnosis not present

## 2021-07-15 DIAGNOSIS — M25512 Pain in left shoulder: Secondary | ICD-10-CM

## 2021-07-15 DIAGNOSIS — S4992XA Unspecified injury of left shoulder and upper arm, initial encounter: Secondary | ICD-10-CM | POA: Diagnosis present

## 2021-07-15 DIAGNOSIS — Z96651 Presence of right artificial knee joint: Secondary | ICD-10-CM | POA: Diagnosis not present

## 2021-07-15 MED ORDER — PROPOFOL 10 MG/ML IV BOLUS
INTRAVENOUS | Status: AC | PRN
  Administered 2021-07-15 (×2): 40.8 mg via INTRAVENOUS

## 2021-07-15 MED ORDER — PROPOFOL 10 MG/ML IV BOLUS
1.0000 mg/kg | Freq: Once | INTRAVENOUS | Status: AC
  Administered 2021-07-15: 81.6 mg via INTRAVENOUS
  Filled 2021-07-15: qty 20

## 2021-07-15 NOTE — ED Notes (Signed)
Wife at bedside.  Patient and wife provided with procedure sedation discharge instruction.  Home care reviewed.  Understanding voiced from patient and wife

## 2021-07-15 NOTE — Sedation Documentation (Signed)
Left shoulder reduced at this time

## 2021-07-15 NOTE — Sedation Documentation (Signed)
Vital signs stable. 

## 2021-07-15 NOTE — ED Triage Notes (Signed)
Pt comes with c/o left arm shoulder dislocation. Pt states trip and fall about week ago.

## 2021-07-15 NOTE — ED Provider Notes (Signed)
[facility] Emergency Department Provider Note   ____________________________________________   Event Date/Time   First MD Initiated Contact with Patient 07/15/21 1820     (approximate)  I have reviewed the triage vital signs and the nursing notes.   HISTORY  Chief Complaint Arm Pain    HPI Austin Dillon is a 70 y.o. 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   Arthritis    right foot   Chronic osteomyelitis of right lower leg ([place]) 02/05/2020   Headache    History of blood transfusion    MSSA (methicillin susceptible Staphylococcus aureus) infection 12/25/2019   Nicotine dependence 02/05/2020   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   PTSD (post-traumatic stress disorder)    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[place]), chronic  02/04/2020    Patient Active Problem List   Diagnosis Date Noted   Chronic osteomyelitis of right lower leg ([place]) 02/05/2020   Nicotine dependence 02/05/2020   PTSD (post-traumatic stress disorder)    Alzheimer disease ([place])    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[place]), chronic  02/04/2020   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/25/2019   MSSA (methicillin susceptible Staphylococcus aureus) infection 12/25/2019   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   Closed right hip fracture, initial encounter ([place]) 01/12/2018   Depression with anxiety 01/12/2018   Open fracture ankle, bimalleolar, right, type III, with delayed healing, subsequent encounter 04/04/2017   Open ankle fracture, right, type I or II, initial encounter 03/26/2017   Type III open fracture of right ankle 03/26/2017    Past Surgical History:  Procedure Laterality Date   AMPUTATION Right 02/04/2020   Procedure: AMPUTATION BELOW KNEE;  Surgeon: Altamese Vivian, MD;  Location: [facility];  Service: Orthopedics;  Laterality: Right;   APPENDECTOMY     EXTERNAL FIXATION LEG Right 03/26/2017   Procedure: EXTERNAL FIXATION RIGHT LEG;  Surgeon: Mcarthur Rossetti, MD;  Location: [facility];  Service: Orthopedics;  Laterality: Right;   ORIF ANKLE FRACTURE Right 03/29/2017   Procedure: OPEN REDUCTION INTERNAL FIXATION (ORIF) RIGHT  ANKLE FRACTURE, Removal of External Fixator;  Surgeon: Newt Minion, MD;  Location: [facility];  Service: Orthopedics;  Laterality: Right;   TOTAL HIP ARTHROPLASTY Right 01/13/2018   Procedure: RIGHT ANTERIOR TOTAL HIP REPLACEMENT;  Surgeon: Mcarthur Rossetti, MD;  Location: [facility];  Service: Orthopedics;  Laterality: Right;    Prior to Admission medications   Medication Sig Start Date End Date Taking? Authorizing Provider  alprazolam Duanne Moron) 2 MG tablet Take 2 mg by mouth 3 (three) times daily.     [provider]  ascorbic acid (VITAMIN C) 1000 MG tablet Take 1 tablet (1,000 mg total) by mouth daily. 02/08/20   Ainsley Spinner, PA-C  Cholecalciferol (VITAMIN D) 125 MCG (5000 UT) CAPS Take 1 capsule  by mouth daily. 02/07/20   Ainsley Spinner, PA-C  lamoTRIgine (LAMICTAL) 100 MG tablet Take 150 mg by mouth in the morning and at bedtime.     [provider]  naproxen sodium (ALEVE) 220 MG tablet Take 220 mg by mouth 2 (two) times daily as needed (pain).    [provider]  OLANZapine (ZYPREXA) 2.5 MG tablet Take 2.5 mg by mouth in the  morning and at bedtime.  11/12/19   [provider]  rivaroxaban (XARELTO) 10 MG TABS tablet Take 1 tablet (10 mg total) by mouth daily. 02/07/20   Ainsley Spinner, PA-C  Vitamin D, Ergocalciferol, (DRISDOL) 1.25 MG (50000 UNIT) CAPS capsule Take 1 capsule (50,000 Units total) by mouth every 7 (seven) days. 02/13/20   Ainsley Spinner, PA-C    Allergies Nortriptyline and Aricept [donepezil]  No family history on file.  Social History Social History   Tobacco Use   Smoking status: Every Day    Packs/day: 1.50    Years: 25.00    Pack years: 37.50    Types: Cigarettes   Smokeless tobacco: Never  Vaping Use   Vaping Use: Never used  Substance Use Topics   Alcohol use: Not Currently   Drug use: No    Review of Systems Constitutional: No fever/chills Eyes: No visual changes. ENT: No sore throat. Cardiovascular: Denies chest pain. Respiratory: Denies shortness of breath. Gastrointestinal: No abdominal pain.  No nausea, no vomiting.  No diarrhea. Genitourinary: Negative for dysuria. Musculoskeletal: Positive for left shoulder pain Skin: Negative for rash. Neurological: Negative for headaches, weakness/numbness/paresthesias in any extremity Psychiatric: Negative for suicidal ideation/homicidal ideation   ____________________________________________   PHYSICAL EXAM:  VITAL SIGNS: ED Triage Vitals  Enc Vitals Group     BP 07/15/21 1525 (!) 151/82     Pulse Rate 07/15/21 1525 93     Resp 07/15/21 1525 18     Temp 07/15/21 1525 98.1 F (36.7 C)     Temp Source 07/15/21 1525 Oral     SpO2 07/15/21 1525 98 %     Weight 07/15/21 1858 180 lb (81.6 kg)     Height 07/15/21 1858 '5\' 8"'$  (1.727 m)     Head Circumference --      Peak Flow --      Pain Score 07/15/21 1506 6     Pain Loc --      Pain Edu? --      Excl. in [place]? --    Constitutional: Alert and oriented. Well appearing and in no acute distress. Eyes: Conjunctivae are normal. PERRL. Head: Atraumatic. Nose: No  congestion/rhinnorhea. Mouth/Throat: Mucous membranes are moist. Neck: No stridor Cardiovascular: Grossly normal heart sounds.  Good peripheral circulation. Respiratory: Normal respiratory effort.  No retractions. Gastrointestinal: Soft and nontender. No distention. Musculoskeletal: Left shoulder deformity Neurologic:  Normal speech and language. No gross focal neurologic deficits are appreciated. Skin:  Skin is warm and dry. No rash noted. Psychiatric: Mood and affect are normal. Speech and behavior are normal.  RADIOLOGY  ED MD interpretation: 2 view x-ray of the left shoulder shows anterior inferior dislocation of the humeral head  Official radiology report(s): DG Shoulder Left  Result Date: 07/15/2021 CLINICAL DATA:  Post reduction EXAM: LEFT SHOULDER - 2+ VIEW COMPARISON:  07/15/2021 at 1825 hours FINDINGS: Interval relocation of the left humeral head. No fracture is seen. Visualized soft tissues are within normal limits. Visualized left lung is clear. IMPRESSION: Interval relocation of the left humeral head.  No fracture is  seen. Electronically Signed   By: Julian Hy M.D.   On: 07/15/2021 21:00   DG Shoulder Left  Result Date: 07/15/2021 CLINICAL DATA:  Left shoulder dislocation. EXAM: LEFT SHOULDER - 2+ VIEW COMPARISON:  Chest radiograph January 27, 2020 FINDINGS: Anterior inferior dislocation of the humeral head in relation to the glenoid. IMPRESSION: Anterior inferior dislocation of the humeral head in relation to the glenoid. Electronically Signed   By: Dahlia Bailiff MD   On: 07/15/2021 19:10    ____________________________________________   PROCEDURES  Procedure(s) performed (including Critical Care):  .Ortho Injury Treatment  Date/Time: 07/15/2021 11:38 PM Performed by: Naaman Plummer, MD Authorized by: Naaman Plummer, MD   Consent:    Consent obtained:  Verbal and written   Consent given by:  Patient   Risks discussed:  Irreducible dislocation    Alternatives discussed:  No treatment, immobilization and referralInjury location: shoulder Location details: left shoulder Injury type: dislocation Dislocation type: anterior Hill-Sachs deformity: no Chronicity: new Pre-procedure neurovascular assessment: neurovascularly intact Pre-procedure distal perfusion: normal Pre-procedure neurological function: normal Pre-procedure range of motion: reduced  Anesthesia: Local anesthesia used: no  Patient sedated: Yes. Refer to sedation procedure documentation for details of sedation. Manipulation performed: yes Reduction method: external rotation Reduction successful: yes X-ray confirmed reduction: yes Immobilization: sling Post-procedure neurovascular assessment: post-procedure neurovascularly intact Post-procedure distal perfusion: normal Post-procedure neurological function: normal Post-procedure range of motion: improved   .Sedation  Date/Time: 07/15/2021 11:39 PM Performed by: Naaman Plummer, MD Authorized by: Naaman Plummer, MD   Consent:    Consent obtained:  Written and verbal   Consent given by:  Patient   Risks discussed:  Allergic reaction, prolonged hypoxia resulting in organ damage, dysrhythmia, prolonged sedation necessitating reversal, nausea, respiratory compromise necessitating ventilatory assistance and intubation, vomiting and inadequate sedation   Alternatives discussed:  Analgesia without sedation, anxiolysis and regional anesthesia Universal protocol:    Immediately prior to procedure, a time out was called: yes   Indications:    Procedure performed:  Dislocation reduction   Procedure necessitating sedation performed by:  Physician performing sedation Pre-sedation assessment:    Time since last food or drink:  1700   ASA classification: class 2 - patient with mild systemic disease     Mallampati score:  II - soft palate, uvula, fauces visible   Neck mobility: normal     Pre-sedation assessments completed  and reviewed: airway patency, cardiovascular function, hydration status, mental status, nausea/vomiting, pain level, respiratory function and temperature   Immediate pre-procedure details:    Reassessment: Patient reassessed immediately prior to procedure     Reviewed: vital signs, relevant labs/tests and NPO status     Verified: bag valve mask available, emergency equipment available, intubation equipment available, IV patency confirmed, oxygen available, reversal medications available and suction available   Procedure details (see MAR for exact dosages):    Preoxygenation:  Nasal cannula   Sedation:  Propofol   Intended level of sedation: deep   Analgesia:  None   Intra-procedure monitoring:  Blood pressure monitoring, continuous capnometry, frequent LOC assessments, cardiac monitor, continuous pulse oximetry and frequent vital sign checks   Intra-procedure events: none     Total Provider sedation time (minutes):  32 Post-procedure details:    Attendance: Constant attendance by certified staff until patient recovered     Recovery: Patient returned to pre-procedure baseline     Post-sedation assessments completed and reviewed: airway patency, cardiovascular function, hydration status, mental status, nausea/vomiting, pain level, respiratory  function and temperature     Patient is stable for discharge or admission: yes     Procedure completion:  Tolerated well, no immediate complications   ____________________________________________   INITIAL IMPRESSION / ASSESSMENT AND PLAN / ED COURSE  As part of my medical decision making, I reviewed the following data within the electronic medical record, if available:  Nursing notes reviewed and incorporated, Labs reviewed, EKG interpreted, Old chart reviewed, Radiograph reviewed and Notes from prior ED visits reviewed and incorporated        Workup: XR Shoulder Findings: Dislocation  Patient does not currently demonstrate complications of  dislocation such as compartment syndrome, arterial injury or nerve injury. The dislocation has been satisfactorily reduced and immobilized, and the patient has been given appropriate analgesia. Rx: sling immobilization 1 week, with gentle ROM to follow Disposition: Discharge with strict return precautions and instructions to follow up with primary MD within 48 hours for further evaluation including referral to an orthopedist.      ____________________________________________   FINAL CLINICAL IMPRESSION(S) / ED DIAGNOSES  Final diagnoses:  Fall, initial encounter  Acute pain of left shoulder  Anterior dislocation of left shoulder, initial encounter     ED Discharge Orders     None        Note:  This document was prepared using Dragon voice recognition software and may include unintentional dictation errors.    Naaman Plummer, MD 07/15/21 [phone]

## 2021-07-15 NOTE — Sedation Documentation (Signed)
Patient unable to relax at this time

## 2021-07-15 NOTE — Sedation Documentation (Signed)
Patient alert and able to answer questions.  Drowsy at this time. Will continue to monitor

## 2021-09-23 ENCOUNTER — Other Ambulatory Visit (HOSPITAL_COMMUNITY): Payer: Self-pay | Admitting: Gastroenterology

## 2021-09-23 ENCOUNTER — Other Ambulatory Visit: Payer: Self-pay

## 2021-09-23 ENCOUNTER — Other Ambulatory Visit: Payer: Self-pay | Admitting: Gastroenterology

## 2021-09-23 ENCOUNTER — Ambulatory Visit
Admission: RE | Admit: 2021-09-23 | Discharge: 2021-09-23 | Disposition: A | Discharge status: Home / Self Care | Payer: Medicare Other | Source: Ambulatory Visit | Attending: Gastroenterology | Admitting: Gastroenterology

## 2021-09-23 DIAGNOSIS — R1084 Generalized abdominal pain: Secondary | ICD-10-CM | POA: Diagnosis present

## 2021-09-23 DIAGNOSIS — R194 Change in bowel habit: Secondary | ICD-10-CM | POA: Insufficient documentation

## 2021-09-23 DIAGNOSIS — R634 Abnormal weight loss: Secondary | ICD-10-CM | POA: Insufficient documentation

## 2021-09-23 MED ORDER — IOHEXOL 350 MG/ML SOLN
100.0000 mL | Freq: Once | INTRAVENOUS | Status: AC | PRN
  Administered 2021-09-23: 100 mL via INTRAVENOUS

## 2021-09-28 ENCOUNTER — Encounter
Admission: RE | Disposition: A | Discharge status: Home / Self Care | Payer: Self-pay | Source: Ambulatory Visit | Attending: Gastroenterology

## 2021-09-28 ENCOUNTER — Other Ambulatory Visit: Payer: Self-pay

## 2021-09-28 ENCOUNTER — Ambulatory Visit: Payer: Medicare Other | Admitting: Anesthesiology

## 2021-09-28 ENCOUNTER — Ambulatory Visit
Admission: RE | Admit: 2021-09-28 | Discharge: 2021-09-28 | Disposition: A | Discharge status: Home / Self Care | Payer: Medicare Other | Source: Ambulatory Visit | Attending: Gastroenterology | Admitting: Gastroenterology

## 2021-09-28 DIAGNOSIS — Z79899 Other long term (current) drug therapy: Secondary | ICD-10-CM | POA: Diagnosis not present

## 2021-09-28 DIAGNOSIS — K921 Melena: Secondary | ICD-10-CM | POA: Diagnosis present

## 2021-09-28 DIAGNOSIS — Z9049 Acquired absence of other specified parts of digestive tract: Secondary | ICD-10-CM | POA: Diagnosis not present

## 2021-09-28 DIAGNOSIS — F17210 Nicotine dependence, cigarettes, uncomplicated: Secondary | ICD-10-CM | POA: Diagnosis not present

## 2021-09-28 DIAGNOSIS — K2950 Unspecified chronic gastritis without bleeding: Secondary | ICD-10-CM | POA: Insufficient documentation

## 2021-09-28 DIAGNOSIS — Z800 Family history of malignant neoplasm of digestive organs: Secondary | ICD-10-CM | POA: Insufficient documentation

## 2021-09-28 DIAGNOSIS — D125 Benign neoplasm of sigmoid colon: Secondary | ICD-10-CM | POA: Diagnosis not present

## 2021-09-28 DIAGNOSIS — F02818 Dementia in other diseases classified elsewhere, unspecified severity, with other behavioral disturbance: Secondary | ICD-10-CM | POA: Diagnosis not present

## 2021-09-28 DIAGNOSIS — K449 Diaphragmatic hernia without obstruction or gangrene: Secondary | ICD-10-CM | POA: Insufficient documentation

## 2021-09-28 DIAGNOSIS — G300 Alzheimer's disease with early onset: Secondary | ICD-10-CM | POA: Insufficient documentation

## 2021-09-28 DIAGNOSIS — Z7901 Long term (current) use of anticoagulants: Secondary | ICD-10-CM | POA: Insufficient documentation

## 2021-09-28 DIAGNOSIS — K3189 Other diseases of stomach and duodenum: Secondary | ICD-10-CM | POA: Diagnosis not present

## 2021-09-28 DIAGNOSIS — R1030 Lower abdominal pain, unspecified: Secondary | ICD-10-CM | POA: Insufficient documentation

## 2021-09-28 DIAGNOSIS — K640 First degree hemorrhoids: Secondary | ICD-10-CM | POA: Diagnosis not present

## 2021-09-28 DIAGNOSIS — Z888 Allergy status to other drugs, medicaments and biological substances status: Secondary | ICD-10-CM | POA: Insufficient documentation

## 2021-09-28 DIAGNOSIS — R194 Change in bowel habit: Secondary | ICD-10-CM | POA: Insufficient documentation

## 2021-09-28 DIAGNOSIS — R634 Abnormal weight loss: Secondary | ICD-10-CM | POA: Diagnosis present

## 2021-09-28 DIAGNOSIS — D120 Benign neoplasm of cecum: Secondary | ICD-10-CM | POA: Insufficient documentation

## 2021-09-28 HISTORY — PX: ESOPHAGOGASTRODUODENOSCOPY (EGD) WITH PROPOFOL: SHX5813

## 2021-09-28 HISTORY — PX: COLONOSCOPY WITH PROPOFOL: SHX5780

## 2021-09-28 SURGERY — ESOPHAGOGASTRODUODENOSCOPY (EGD) WITH PROPOFOL
Anesthesia: General

## 2021-09-28 MED ORDER — PROPOFOL 500 MG/50ML IV EMUL
INTRAVENOUS | Status: DC | PRN
  Administered 2021-09-28: 120 ug/kg/min via INTRAVENOUS

## 2021-09-28 MED ORDER — DEXMEDETOMIDINE HCL IN NACL 200 MCG/50ML IV SOLN
INTRAVENOUS | Status: DC | PRN
  Administered 2021-09-28: 8 ug via INTRAVENOUS
  Administered 2021-09-28 (×3): 4 ug via INTRAVENOUS

## 2021-09-28 MED ORDER — SODIUM CHLORIDE 0.9 % IV SOLN: INTRAVENOUS | Status: DC

## 2021-09-28 MED ORDER — PROPOFOL 500 MG/50ML IV EMUL
INTRAVENOUS | Status: AC
  Filled 2021-09-28: qty 50

## 2021-09-28 MED ORDER — PROPOFOL 10 MG/ML IV BOLUS
INTRAVENOUS | Status: DC | PRN
  Administered 2021-09-28: 80 mg via INTRAVENOUS
  Administered 2021-09-28: 20 mg via INTRAVENOUS

## 2021-09-28 NOTE — Anesthesia Preprocedure Evaluation (Signed)
Anesthesia Evaluation  Patient identified by MRN, date of birth, ID band Patient awake    Reviewed: Allergy & Precautions, NPO status , Patient's Chart, lab work & pertinent test results  History of Anesthesia Complications Negative for: history of anesthetic complications  Airway Mallampati: IV   Neck ROM: Full    Dental  (+) Poor Dentition   Pulmonary Current Smoker (1/2 pp week) and Patient abstained from smoking.,    Pulmonary exam normal breath sounds clear to auscultation       Cardiovascular Exercise Tolerance: Good negative cardio ROS Normal cardiovascular exam Rhythm:Regular Rate:Normal     Neuro/Psych  Headaches, PSYCHIATRIC DISORDERS (PTSD, Alzheimer dementia) Anxiety Depression    GI/Hepatic negative GI ROS,   Endo/Other  negative endocrine ROS  Renal/GU negative Renal ROS     Musculoskeletal  (+) Arthritis ,   Abdominal   Peds  Hematology negative hematology ROS (+)   Anesthesia Other Findings   Reproductive/Obstetrics                             Anesthesia Physical Anesthesia Plan  ASA: 3  Anesthesia Plan: General   Post-op Pain Management:    Induction: Intravenous  PONV Risk Score and Plan: 1 and Propofol infusion, TIVA and Treatment may vary due to age or medical condition  Airway Management Planned: Natural Airway  Additional Equipment:   Intra-op Plan:   Post-operative Plan:   Informed Consent: I have reviewed the patients History and Physical, chart, labs and discussed the procedure including the risks, benefits and alternatives for the proposed anesthesia with the patient or authorized representative who has indicated his/her understanding and acceptance.       Plan Discussed with: CRNA  Anesthesia Plan Comments:         Anesthesia Quick Evaluation

## 2021-09-28 NOTE — Transfer of Care (Signed)
Immediate Anesthesia Transfer of Care Note  Patient: Austin Dillon  Procedure(s) Performed: ESOPHAGOGASTRODUODENOSCOPY (EGD) WITH PROPOFOL COLONOSCOPY WITH PROPOFOL  Patient Location: PACU and Endoscopy Unit  Anesthesia Type:General  Level of Consciousness: drowsy and patient cooperative  Airway & Oxygen Therapy: Patient Spontanous Breathing  Post-op Assessment: Report given to RN and Post -op Vital signs reviewed and stable  Post vital signs: Reviewed and stable  Last Vitals:  Vitals Value Taken Time  BP 94/54 09/28/21 1327  Temp 35.9 C 09/28/21 1327  Pulse 61 09/28/21 1327  Resp 16 09/28/21 1327  SpO2 99 % 09/28/21 1327    Last Pain:  Vitals:   09/28/21 1327  TempSrc: Temporal  PainSc: Asleep         Complications: No notable events documented.

## 2021-09-28 NOTE — H&P (Signed)
Outpatient short stay form Pre-procedure 09/28/2021  Lesly Rubenstein, MD  Primary Physician: Baxter Hire, MD  Reason for visit:  Weight loss, hematochezia  History of present illness:   70 y/o gentleman with early onset Alzheimers here for EGD/Colonoscopy due to history of hematochezia and 20 lb weight loss. Had a colonoscopy in 2014 with TA. History of appendectomy. Cousin with history of colon cancer. He smokes cigarettes.    Current Facility-Administered Medications:    0.9 %  sodium chloride infusion, , Intravenous, Continuous, Wavie Hashimi, Hilton Cork, MD, Last Rate: 20 mL/hr at 09/28/21 1217, New Bag at 09/28/21 1217  Medications Prior to Admission  Medication Sig Dispense Refill Last Dose   alprazolam (XANAX) 2 MG tablet Take 2 mg by mouth 3 (three) times daily.   09/27/2021   ascorbic acid (VITAMIN C) 1000 MG tablet Take 1 tablet (1,000 mg total) by mouth daily. 60 tablet 0 Past Week   Cholecalciferol (VITAMIN D) 125 MCG (5000 UT) CAPS Take 1 capsule by mouth daily. 30 capsule 3 Past Week   DULoxetine (CYMBALTA) 20 MG capsule Take 20 mg by mouth daily.   Past Week   lamoTRIgine (LAMICTAL) 100 MG tablet Take 150 mg by mouth in the morning and at bedtime.    Past Week   naproxen sodium (ALEVE) 220 MG tablet Take 220 mg by mouth 2 (two) times daily as needed (pain).   Past Week   Vitamin D, Ergocalciferol, (DRISDOL) 1.25 MG (50000 UNIT) CAPS capsule Take 1 capsule (50,000 Units total) by mouth every 7 (seven) days. 7 capsule 1 Past Week   OLANZapine (ZYPREXA) 2.5 MG tablet Take 2.5 mg by mouth in the morning and at bedtime.       rivaroxaban (XARELTO) 10 MG TABS tablet Take 1 tablet (10 mg total) by mouth daily. 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    Physical Exam  Gen: Alert, oriented. Appears stated age.  HEENT: PERRLA. Lungs: No respiratory distress CV: RRR Abd: soft, benign, no masses Ext: No edema    Planned procedures: Proceed with EGD/colonoscopy. The patient understands the nature of the planned procedure, indications, risks, alternatives and potential complications including but not limited to bleeding, infection, perforation, damage to internal organs and possible oversedation/side effects from anesthesia. The patient agrees and gives consent to proceed.  Please refer to procedure notes for findings, recommendations and patient disposition/instructions.     Lesly Rubenstein, MD [facility]

## 2021-09-28 NOTE — Anesthesia Postprocedure Evaluation (Signed)
Anesthesia Post Note  Patient: Austin Dillon  Procedure(s) Performed: ESOPHAGOGASTRODUODENOSCOPY (EGD) WITH PROPOFOL COLONOSCOPY WITH PROPOFOL  Patient location during evaluation: PACU Anesthesia Type: General Level of consciousness: awake and alert, oriented and patient cooperative Pain management: pain level controlled Vital Signs Assessment: post-procedure vital signs reviewed and stable Respiratory status: spontaneous breathing, nonlabored ventilation and respiratory function stable Cardiovascular status: blood pressure returned to baseline and stable Postop Assessment: adequate PO intake Anesthetic complications: no   No notable events documented.   Last Vitals:  Vitals:   09/28/21 1337 09/28/21 1347  BP: 118/65 138/71  Pulse: 68 67  Resp: 16 18  Temp:    SpO2: 100% 100%    Last Pain:  Vitals:   09/28/21 1347  TempSrc:   PainSc: 0-No pain                 Darrin Nipper

## 2021-09-28 NOTE — Interval H&P Note (Signed)
History and Physical Interval Note:  09/28/2021 12:23 PM  Austin Dillon  has presented today for surgery, with the diagnosis of Generalized postprandial abdominal pain; Unintentional weight loss; Tenesmus (rectal); Change in bowel habits; Hx of adenomatous colonic polyps; Hematochezia.  The various methods of treatment have been discussed with the patient and family. After consideration of risks, benefits and other options for treatment, the patient has consented to  Procedure(s): ESOPHAGOGASTRODUODENOSCOPY (EGD) WITH PROPOFOL (N/A) COLONOSCOPY WITH PROPOFOL (N/A) as a surgical intervention.  The patient's history has been reviewed, patient examined, no change in status, stable for surgery.  I have reviewed the patient's chart and labs.  Questions were answered to the patient's satisfaction.     Lesly Rubenstein  Ok to proceed with EGD/Colonoscopy

## 2021-09-28 NOTE — Op Note (Signed)
[facility] Patient Name: Austin Dillon Procedure Date: 09/28/2021 12:39 PM MRN: [id number] Account #: [id number] Date of Birth: 08/21/1951 Admit Type: Outpatient Age: 70 Room: [facility] ENDO ROOM 1 Gender: Male Note Status: Finalized Instrument Name: Upper Endoscope [id number] Procedure:             Upper GI endoscopy Indications:           Lower abdominal pain Providers:             Andrey Farmer MD, MD Referring MD:          Andres Labrum, MD (Referring MD) Medicines:             Monitored Anesthesia Care Complications:         No immediate complications. Estimated blood loss:                         Minimal. Procedure:             Pre-Anesthesia Assessment:                        - Prior to the procedure, a History and Physical was                         performed, and patient medications and allergies were                         reviewed. The patient is competent. The risks and                         benefits of the procedure and the sedation options and                         risks were discussed with the patient. All questions                         were answered and informed consent was obtained.                         Patient identification and proposed procedure were                         verified by the physician, the nurse, the anesthetist                         and the technician in the endoscopy suite. Mental                         Status Examination: alert and oriented. Airway                         Examination: normal oropharyngeal airway and neck                         mobility. Respiratory Examination: clear to                         auscultation. CV Examination: normal. Prophylactic  Antibiotics: The patient does not require prophylactic                         antibiotics. Prior Anticoagulants: The patient has                         taken no previous anticoagulant or antiplatelet                          agents. ASA Grade Assessment: III - A patient with                         severe systemic disease. After reviewing the risks and                         benefits, the patient was deemed in satisfactory                         condition to undergo the procedure. The anesthesia                         plan was to use monitored anesthesia care (MAC).                         Immediately prior to administration of medications,                         the patient was re-assessed for adequacy to receive                         sedatives. The heart rate, respiratory rate, oxygen                         saturations, blood pressure, adequacy of pulmonary                         ventilation, and response to care were monitored                         throughout the procedure. The physical status of the                         patient was re-assessed after the procedure.                        After obtaining informed consent, the endoscope was                         passed under direct vision. Throughout the procedure,                         the patient's blood pressure, pulse, and oxygen                         saturations were monitored continuously. The Endoscope                         was introduced through the mouth, and advanced to the  second part of duodenum. The upper GI endoscopy was                         accomplished without difficulty. The patient tolerated                         the procedure well. Findings:      A small hiatal hernia was present.      The exam of the esophagus was otherwise normal.      Patchy mild inflammation characterized by adherent blood was found in       the gastric antrum. Biopsies were taken with a cold forceps for       Helicobacter pylori testing. Estimated blood loss was minimal.      Patchy mildly erythematous mucosa without active bleeding and with no       stigmata of bleeding was found in the duodenal  bulb. Impression:            - Small hiatal hernia.                        - Gastritis. Biopsied.                        - Erythematous duodenopathy. Recommendation:        - Discharge patient to home.                        - Resume previous diet.                        - Continue present medications.                        - Await pathology results.                        - Return to referring physician as previously                         scheduled. Procedure Code(s):     --- Professional ---                        [phone], Esophagogastroduodenoscopy, flexible,                         transoral; with biopsy, single or multiple Diagnosis Code(s):     --- Professional ---                        K44.9, Diaphragmatic hernia without obstruction or                         gangrene                        K29.70, Gastritis, unspecified, without bleeding                        K31.89, Other diseases of stomach and duodenum                        R10.30, Lower abdominal pain, unspecified CPT  copyright 2019 American Medical Association. All rights reserved. The codes documented in this report are preliminary and upon coder review may  be revised to meet current compliance requirements. Andrey Farmer MD, MD 09/28/2021 1:27:25 PM Number of Addenda: 0 Note Initiated On: 09/28/2021 12:39 PM Estimated Blood Loss:  Estimated blood loss was minimal.      [facility]

## 2021-09-28 NOTE — Op Note (Signed)
[facility] Patient Name: Hilton Saephan Procedure Date: 09/28/2021 12:38 PM MRN: [id number] Account #: [id number] Date of Birth: Dec 03, 1951 Admit Type: Outpatient Age: 70 Room: [facility] ENDO ROOM 1 Gender: Male Note Status: Finalized Instrument Name: Jasper Riling [id number] Procedure:             Colonoscopy Indications:           Lower abdominal pain, Hematochezia, Change in bowel                         habits Providers:             Andrey Farmer MD, MD Referring MD:          Andres Labrum, MD (Referring MD) Medicines:             Monitored Anesthesia Care Complications:         No immediate complications. Estimated blood loss:                         Minimal. Procedure:             Pre-Anesthesia Assessment:                        - Prior to the procedure, a History and Physical was                         performed, and patient medications and allergies were                         reviewed. The patient is competent. The risks and                         benefits of the procedure and the sedation options and                         risks were discussed with the patient. All questions                         were answered and informed consent was obtained.                         Patient identification and proposed procedure were                         verified by the physician, the nurse, the anesthetist                         and the technician in the endoscopy suite. Mental                         Status Examination: alert and oriented. Airway                         Examination: normal oropharyngeal airway and neck                         mobility. Respiratory Examination: clear to  auscultation. CV Examination: normal. Prophylactic                         Antibiotics: The patient does not require prophylactic                         antibiotics. Prior Anticoagulants: The patient has                         taken no  previous anticoagulant or antiplatelet                         agents. ASA Grade Assessment: III - A patient with                         severe systemic disease. After reviewing the risks and                         benefits, the patient was deemed in satisfactory                         condition to undergo the procedure. The anesthesia                         plan was to use monitored anesthesia care (MAC).                         Immediately prior to administration of medications,                         the patient was re-assessed for adequacy to receive                         sedatives. The heart rate, respiratory rate, oxygen                         saturations, blood pressure, adequacy of pulmonary                         ventilation, and response to care were monitored                         throughout the procedure. The physical status of the                         patient was re-assessed after the procedure.                        After obtaining informed consent, the colonoscope was                         passed under direct vision. Throughout the procedure,                         the patient's blood pressure, pulse, and oxygen                         saturations were monitored continuously. The  Colonoscope was introduced through the anus and                         advanced to the the terminal ileum. The colonoscopy                         was performed without difficulty. The patient                         tolerated the procedure well. The quality of the bowel                         preparation was good. Findings:      The perianal and digital rectal examinations were normal.      The terminal ileum appeared normal.      Two sessile polyps were found in the cecum. The polyps were 1 to 2 mm in       size. These polyps were removed with a jumbo cold forceps. Resection and       retrieval were complete. Estimated blood loss was minimal.      Two  sessile polyps were found in the sigmoid colon. The polyps were 1 to       3 mm in size. These polyps were removed with a cold snare. Resection and       retrieval were complete. Estimated blood loss was minimal.      Internal hemorrhoids were found during retroflexion. The hemorrhoids       were Grade I (internal hemorrhoids that do not prolapse).      The exam was otherwise without abnormality on direct and retroflexion       views. Impression:            - The examined portion of the ileum was normal.                        - Two 1 to 2 mm polyps in the cecum, removed with a                         jumbo cold forceps. Resected and retrieved.                        - Two 1 to 3 mm polyps in the sigmoid colon, removed                         with a cold snare. Resected and retrieved.                        - Internal hemorrhoids.                        - The examination was otherwise normal on direct and                         retroflexion views. Recommendation:        - Discharge patient to home.                        - Resume previous diet.                        -  Continue present medications.                        - Await pathology results.                        - Repeat colonoscopy is not recommended due to current                         age (28 years or older) for surveillance.                        - Return to referring physician as previously                         scheduled. Procedure Code(s):     --- Professional ---                        [id number], Colonoscopy, flexible; with removal of                         tumor(s), polyp(s), or other lesion(s) by snare                         technique                        45380, 59, Colonoscopy, flexible; with biopsy, single                         or multiple Diagnosis Code(s):     --- Professional ---                        K64.0, First degree hemorrhoids                        K63.5, Polyp of colon                         R10.30, Lower abdominal pain, unspecified                        K92.1, Melena (includes Hematochezia)                        R19.4, Change in bowel habit CPT copyright 2019 American Medical Association. All rights reserved. The codes documented in this report are preliminary and upon coder review may  be revised to meet current compliance requirements. Andrey Farmer MD, MD 09/28/2021 1:30:28 PM Number of Addenda: 0 Note Initiated On: 09/28/2021 12:38 PM Scope Withdrawal Time: 0 hours 14 minutes 56 seconds  Total Procedure Duration: 0 hours 21 minutes 21 seconds  Estimated Blood Loss:  Estimated blood loss was minimal.      [facility]

## 2021-09-29 ENCOUNTER — Encounter: Payer: Self-pay | Admitting: Gastroenterology

## 2021-09-29 LAB — SURGICAL PATHOLOGY

## 2021-10-07 ENCOUNTER — Ambulatory Visit (INDEPENDENT_AMBULATORY_CARE_PROVIDER_SITE_OTHER): Payer: Medicare Other | Admitting: Urology

## 2021-10-07 ENCOUNTER — Encounter: Payer: Self-pay | Admitting: Urology

## 2021-10-07 ENCOUNTER — Other Ambulatory Visit: Payer: Self-pay

## 2021-10-07 VITALS — BP 147/70 | HR 89 | Ht 68.0 in | Wt 163.0 lb

## 2021-10-07 DIAGNOSIS — N401 Enlarged prostate with lower urinary tract symptoms: Secondary | ICD-10-CM | POA: Diagnosis not present

## 2021-10-07 DIAGNOSIS — N320 Bladder-neck obstruction: Secondary | ICD-10-CM

## 2021-10-07 DIAGNOSIS — R339 Retention of urine, unspecified: Secondary | ICD-10-CM

## 2021-10-07 DIAGNOSIS — N138 Other obstructive and reflux uropathy: Secondary | ICD-10-CM | POA: Diagnosis not present

## 2021-10-07 LAB — BLADDER SCAN AMB NON-IMAGING

## 2021-10-07 MED ORDER — TAMSULOSIN HCL 0.4 MG PO CAPS: 0.4000 mg | ORAL_CAPSULE | Freq: Every day | ORAL | 3 refills | Status: DC

## 2021-10-07 NOTE — Progress Notes (Signed)
10/07/21 1:53 PM   Aliene Altes 01-Jul-1951 [id number]  CC: Incomplete bladder emptying, pelvic pain  HPI: Very comorbid 70 year old male referred from GI for incomplete bladder emptying and bilateral mild hydronephrosis down to the bladder seen on recent CT performed for lower abdominal pain.  He reports a long history of urinary symptoms with weak stream, urgency, incontinence requiring depends.  He denies any dysuria, UTIs, or gross hematuria.  He has never taken medications for this.  Urinalysis in May 2022 was benign.  Renal function is normal.  PVR in clinic today significantly elevated at 640 mL.  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[place]), chronic  02/04/2020    Surgical History: Past Surgical History:  Procedure Laterality Date   AMPUTATION Right 02/04/2020   Procedure: AMPUTATION BELOW KNEE;  Surgeon: Altamese Coyville, MD;  Location: [place];  Service: Orthopedics;  Laterality: Right;   APPENDECTOMY     COLONOSCOPY     COLONOSCOPY WITH PROPOFOL N/A 09/28/2021   Procedure: COLONOSCOPY WITH PROPOFOL;  Surgeon: Lesly Rubenstein, MD;  Location: [facility] ENDOSCOPY;  Service: Endoscopy;  Laterality: N/A;   ESOPHAGOGASTRODUODENOSCOPY (EGD) WITH PROPOFOL N/A 09/28/2021   Procedure: ESOPHAGOGASTRODUODENOSCOPY (EGD) WITH PROPOFOL;  Surgeon: Lesly Rubenstein, MD;  Location: [facility] ENDOSCOPY;  Service: Endoscopy;  Laterality: N/A;   EXTERNAL FIXATION LEG Right 03/26/2017   Procedure: EXTERNAL FIXATION RIGHT LEG;  Surgeon:  Mcarthur Rossetti, MD;  Location: [facility];  Service: Orthopedics;  Laterality: Right;   ORIF ANKLE FRACTURE Right 03/29/2017   Procedure: OPEN REDUCTION INTERNAL FIXATION (ORIF) RIGHT  ANKLE FRACTURE, Removal of External Fixator;  Surgeon: Newt Minion, MD;  Location: [place];  Service: Orthopedics;  Laterality: Right;   TOTAL HIP ARTHROPLASTY Right 01/13/2018   Procedure: RIGHT ANTERIOR TOTAL HIP REPLACEMENT;  Surgeon: Mcarthur Rossetti, MD;  Location: [place];  Service: Orthopedics;  Laterality: Right;     Family History: No family history on file.  Social History:  reports that he has been smoking cigarettes. He has a 37.50 pack-year smoking history. He has never used smokeless tobacco. He reports that he does not currently use alcohol. He reports that he does not use drugs.  Physical Exam: BP (!) 147/70   Pulse 89   Ht 5\' 8"  (1.727 m)   Wt 163 lb (73.9 kg)   BMI 24.78 kg/m    Constitutional:  Alert and oriented, No acute distress.   Cardiovascular: No clubbing, cyanosis, or edema. Respiratory: Normal respiratory effort, no increased work of breathing. GI: Abdomen is soft, nontender, nondistended, no abdominal masses GU: Phallus with patent meatus, no lesions DRE: 40 g prostate, smooth, no nodules or masses   Pertinent Imaging: I have personally viewed and interpreted the CT dated 09/23/2021 that shows significant rectal stool burden, distended bladder, and bilateral mild hydroureteronephrosis down to the bladder, prostate measured 40 g.  Assessment & Plan:   70 year old comorbid male with incomplete bladder emptying of unclear etiology, suspect this is the cause of his lower abdominal pain.  He  had significant constipation on his recent CT, however after recent colonoscopy and prep he continues to have significant postvoid residual of greater than 600 mL indicating urinary symptoms were not from constipation alone.  We reviewed options at length including intermittent  catheterization, Foley catheter for bladder rest and trial of Flomax, and briefly outlet procedures.  We also discussed the concept of an atonic bladder.  He opted for Foley catheter.  Patient was prepped and draped in standard sterile fashion and I passed an 17 [place] coud catheter easily into the bladder with no resistance.  Urine was clear yellow.  Start Flomax RTC 1 week for voiding trial and PVR Consider cystoscopy for consideration of outlet procedures if unable to urinate despite above strategies  I spent 65 total minutes on the day of the encounter including pre-visit review of the medical record, face-to-face time with the patient, and post visit ordering of labs/imaging/tests.  Nickolas Madrid, MD 10/07/2021  [facility] [address] [place], [place] [address] [phone]

## 2021-10-08 ENCOUNTER — Telehealth: Payer: Self-pay | Admitting: *Deleted

## 2021-10-08 NOTE — Telephone Encounter (Signed)
Please see after hours scanned report.  Called pt's wife to find out more information. Per wife pt's bag was full and was red in color. I advised wife that is normal and I discussed with her how to empty the bag. Pt's wife stated EMS came out to empty the bag. Pt is fine and wife is fine now.

## 2021-10-13 ENCOUNTER — Ambulatory Visit (INDEPENDENT_AMBULATORY_CARE_PROVIDER_SITE_OTHER): Payer: Medicare Other | Admitting: Urology

## 2021-10-13 ENCOUNTER — Encounter: Payer: Self-pay | Admitting: Urology

## 2021-10-13 ENCOUNTER — Ambulatory Visit: Payer: Medicare Other | Admitting: Urology

## 2021-10-13 ENCOUNTER — Other Ambulatory Visit: Payer: Self-pay

## 2021-10-13 VITALS — BP 100/68 | HR 102 | Ht 68.0 in | Wt 168.0 lb

## 2021-10-13 DIAGNOSIS — R339 Retention of urine, unspecified: Secondary | ICD-10-CM

## 2021-10-13 LAB — BLADDER SCAN AMB NON-IMAGING

## 2021-10-13 MED ORDER — SULFAMETHOXAZOLE-TRIMETHOPRIM 800-160 MG PO TABS
1.0000 | ORAL_TABLET | Freq: Once | ORAL | Status: AC
  Administered 2021-10-13: 1 via ORAL

## 2021-10-13 NOTE — Progress Notes (Signed)
   10/13/2021 8:34 AM   Aliene Altes [birth year] [id number]  Reason for visit: Follow up urinary retention, bilateral hydronephrosis  HPI: Very comorbid 70 year old male who was referred from GI for incomplete bladder emptying and bilateral mild hydronephrosis down to the bladder seen on a recent CT performed for lower abdominal pain.  He has a long history of urinary symptoms of weak stream, urgency, incontinence requiring depends.  Denies any dysuria, UTIs, gross hematuria.  Urinalysis was benign recently and renal function is normal.  Prostate measured 40 g on recent CT.  At our initial visit on 10/07/2021 his PVR was significantly elevated at 640 mL, and after discussion of options he opted for a Foley catheter and a trial of Flomax.  He did not do well with the Foley and had a fair amount of discomfort and pain.  His Foley was removed this morning, and he has been able to urinate yellow urine throughout the day, and PVR this afternoon is normal at 130 mL.  Bactrim prophylaxis was given x2 doses.  We discussed the need for close monitoring going forward, and return precautions discussed extensively.  Continue Flomax RTC with me 2 weeks for PVR-> if doing well at that time can follow-up yearly  [place], [place] [address] [place], [place] [address] [phone]

## 2021-10-13 NOTE — Progress Notes (Signed)
Catheter Removal  Patient is present today for a catheter removal.  8 ml of water was drained from the balloon. A 16FR foley cath was removed from the bladder no complications were noted . Patient tolerated well.  Performed by: Gaspar Cola   Follow up/ Additional notes this afternoon advised patient to drink water. Please call the office or come in after lunch if patient cant urinate.

## 2021-10-21 ENCOUNTER — Ambulatory Visit: Payer: No Typology Code available for payment source | Admitting: Urology

## 2021-10-27 ENCOUNTER — Other Ambulatory Visit: Payer: Self-pay

## 2021-10-27 ENCOUNTER — Encounter: Payer: Self-pay | Admitting: Urology

## 2021-10-27 ENCOUNTER — Ambulatory Visit (INDEPENDENT_AMBULATORY_CARE_PROVIDER_SITE_OTHER): Payer: Medicare Other | Admitting: Urology

## 2021-10-27 VITALS — BP 137/74 | HR 69 | Ht 68.0 in | Wt 163.0 lb

## 2021-10-27 DIAGNOSIS — R339 Retention of urine, unspecified: Secondary | ICD-10-CM

## 2021-10-27 DIAGNOSIS — N401 Enlarged prostate with lower urinary tract symptoms: Secondary | ICD-10-CM

## 2021-10-27 DIAGNOSIS — N138 Other obstructive and reflux uropathy: Secondary | ICD-10-CM

## 2021-10-27 LAB — BLADDER SCAN AMB NON-IMAGING

## 2021-10-27 NOTE — Patient Instructions (Signed)

## 2021-10-27 NOTE — Progress Notes (Signed)
   10/27/2021 2:47 PM   Austin Dillon 1951/01/27 [id number]  Reason for visit: Follow up urinary retention, bilateral hydronephrosis  HPI: 70 year old comorbid male who was referred from GI for incomplete bladder emptying and bilateral mild hydronephrosis down to the bladder seen on a CT performed for lower abdominal pain.  He had significant constipation with significant rectal stool burden at that time.  Prostate measured 40 g on CT.  He was also having a long history of symptoms of weak stream, urgency, and incontinence requiring depends.  Urinalysis was benign and renal function was normal.  He underwent catheter placement in clinic on 10/07/2021 for PVR of 640 mL with overflow incontinence, and started Flomax.  He did not tolerate the catheter well, and this was ultimately removed early on 10/13/2021 and he was able to urinate with low PVR of 130 mL.  His wife helps with most of the history today.  She thinks he has been doing well, and he really denies any complaints in terms of the urination today.  He is not having any incontinence, and feels like he is urinating with a better stream.  PVR is elevated at 390 mL, but improved from 681mL prior to starting flomax.  I had another long discussion with him today about options moving forward including cystoscopy for further evaluation of outlet procedures like HOLEP or UroLift, close observation on the Flomax, or catheter replacement versus intermittent catheterization.  They are extremely averse to catheter placement or catheterizations, and would like to defer any further evaluation at this time with cystoscopy.  I discussed return precautions extensively including urinary retention, gross hematuria, UTIs, overflow incontinence, worsening urinary symptoms.  We discussed the risks of recurrent retention or infections as well as worsening renal function and hydronephrosis.  Continue Flomax RTC 1 year PVR, sooner if problems    Austin Dillon, Austin Dillon [address] [place], [place] [address] [phone]

## 2021-12-16 ENCOUNTER — Ambulatory Visit (INDEPENDENT_AMBULATORY_CARE_PROVIDER_SITE_OTHER)
Admission: EM | Admit: 2021-12-16 | Discharge: 2021-12-16 | Disposition: A | Discharge status: Other Acute Inpatient Hospital | Payer: Medicare Other | Source: Home / Self Care

## 2021-12-16 ENCOUNTER — Emergency Department (HOSPITAL_COMMUNITY)
Admission: EM | Admit: 2021-12-16 | Discharge: 2021-12-17 | Disposition: A | Discharge status: Home / Self Care | Payer: Medicare Other | Attending: Emergency Medicine | Admitting: Emergency Medicine

## 2021-12-16 DIAGNOSIS — R52 Pain, unspecified: Secondary | ICD-10-CM

## 2021-12-16 DIAGNOSIS — F0392 Unspecified dementia, unspecified severity, with psychotic disturbance: Secondary | ICD-10-CM | POA: Diagnosis not present

## 2021-12-16 DIAGNOSIS — Z046 Encounter for general psychiatric examination, requested by authority: Secondary | ICD-10-CM | POA: Insufficient documentation

## 2021-12-16 DIAGNOSIS — Z20822 Contact with and (suspected) exposure to covid-19: Secondary | ICD-10-CM | POA: Diagnosis not present

## 2021-12-16 DIAGNOSIS — F1099 Alcohol use, unspecified with unspecified alcohol-induced disorder: Secondary | ICD-10-CM

## 2021-12-16 DIAGNOSIS — Z630 Problems in relationship with spouse or partner: Secondary | ICD-10-CM | POA: Insufficient documentation

## 2021-12-16 DIAGNOSIS — F1014 Alcohol abuse with alcohol-induced mood disorder: Secondary | ICD-10-CM

## 2021-12-16 DIAGNOSIS — F1020 Alcohol dependence, uncomplicated: Secondary | ICD-10-CM | POA: Insufficient documentation

## 2021-12-16 DIAGNOSIS — F4310 Post-traumatic stress disorder, unspecified: Secondary | ICD-10-CM | POA: Insufficient documentation

## 2021-12-16 DIAGNOSIS — R4689 Other symptoms and signs involving appearance and behavior: Secondary | ICD-10-CM

## 2021-12-16 LAB — CBC WITH DIFFERENTIAL/PLATELET
Abs Immature Granulocytes: 0.02 10*3/uL (ref 0.00–0.07)
Basophils Absolute: 0 10*3/uL (ref 0.0–0.1)
Basophils Relative: 1 %
Eosinophils Absolute: 0.2 10*3/uL (ref 0.0–0.5)
Eosinophils Relative: 3 %
HCT: 41.8 % (ref 39.0–52.0)
Hemoglobin: 13.9 g/dL (ref 13.0–17.0)
Immature Granulocytes: 0 %
Lymphocytes Relative: 30 %
Lymphs Abs: 1.8 10*3/uL (ref 0.7–4.0)
MCH: 32.9 pg (ref 26.0–34.0)
MCHC: 33.3 g/dL (ref 30.0–36.0)
MCV: 99.1 fL (ref 80.0–100.0)
Monocytes Absolute: 0.5 10*3/uL (ref 0.1–1.0)
Monocytes Relative: 8 %
Neutro Abs: 3.6 10*3/uL (ref 1.7–7.7)
Neutrophils Relative %: 58 %
Platelets: 304 10*3/uL (ref 150–400)
RBC: 4.22 MIL/uL (ref 4.22–5.81)
RDW: 15.7 % — ABNORMAL HIGH (ref 11.5–15.5)
WBC: 6.1 10*3/uL (ref 4.0–10.5)
nRBC: 0 % (ref 0.0–0.2)

## 2021-12-16 LAB — RAPID URINE DRUG SCREEN, HOSP PERFORMED
Amphetamines: NOT DETECTED
Barbiturates: NOT DETECTED
Benzodiazepines: POSITIVE — AB
Cocaine: NOT DETECTED
Opiates: NOT DETECTED
Tetrahydrocannabinol: NOT DETECTED

## 2021-12-16 LAB — ETHANOL: Alcohol, Ethyl (B): <10 mg/dL (ref <10)

## 2021-12-16 LAB — RESP PANEL BY RT-PCR (FLU A&B, COVID) ARPGX2
Influenza A by PCR: NEGATIVE
Influenza B by PCR: NEGATIVE
SARS Coronavirus 2 by RT PCR: NEGATIVE

## 2021-12-16 LAB — COMPREHENSIVE METABOLIC PANEL
ALT: 17 U/L (ref 0–44)
AST: 25 U/L (ref 15–41)
Albumin: 3.9 g/dL (ref 3.5–5.0)
Alkaline Phosphatase: 65 U/L (ref 38–126)
Anion gap: 10 (ref 5–15)
BUN: 14 mg/dL (ref 8–23)
CO2: 26 mmol/L (ref 22–32)
Calcium: 9.6 mg/dL (ref 8.9–10.3)
Chloride: 105 mmol/L (ref 98–111)
Creatinine, Ser: 0.89 mg/dL (ref 0.61–1.24)
GFR, Estimated: >60 mL/min (ref >60)
Glucose, Bld: 102 mg/dL — ABNORMAL HIGH (ref 70–99)
Potassium: 4.5 mmol/L (ref 3.5–5.1)
Sodium: 141 mmol/L (ref 135–145)
Total Bilirubin: 0.5 mg/dL (ref 0.3–1.2)
Total Protein: 7.2 g/dL (ref 6.5–8.1)

## 2021-12-16 LAB — SALICYLATE LEVEL: Salicylate Lvl: <7 mg/dL — ABNORMAL LOW (ref 7.0–30.0)

## 2021-12-16 LAB — ACETAMINOPHEN LEVEL: Acetaminophen (Tylenol), Serum: <10 ug/mL — ABNORMAL LOW (ref 10–30)

## 2021-12-16 MED ORDER — ALPRAZOLAM 0.25 MG PO TABS
2.0000 mg | ORAL_TABLET | Freq: Once | ORAL | Status: AC
  Administered 2021-12-16: 2 mg via ORAL
  Filled 2021-12-16: qty 8

## 2021-12-16 MED ORDER — ACETAMINOPHEN 325 MG PO TABS
650.0000 mg | ORAL_TABLET | Freq: Once | ORAL | Status: AC
  Administered 2021-12-16: 650 mg via ORAL
  Filled 2021-12-16: qty 2
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
## 2021-12-16 NOTE — ED Triage Notes (Signed)
Pt Austin Dillon presents to [facility] via [employer] under IVC. Per IVC pt suffers from dementia and alcoholism. Per IVC pt has been compliant with medication but he often takes more than prescribed. Pt has been exhibiting aggressive behavior towards wife. Pt states that he was lying in bed trying to get some rest prior to the officers bringing him to [facility]. Pt state that he consumed 1 beer earlier today.Pt states that he does not know why he has been brought to this facility and he would like to be discharged home. Pt informed that he would have to be evaluated by a provider before being discharged. Pt denies SI/HI and AVH at this time. Pt is urgent.
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
## 2021-12-16 NOTE — ED Notes (Signed)
[employer] contacted to assist in transport to [facility] hospital safety maintained.
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
## 2021-12-17 ENCOUNTER — Emergency Department (HOSPITAL_COMMUNITY): Payer: Medicare Other

## 2021-12-17 DIAGNOSIS — F1020 Alcohol dependence, uncomplicated: Secondary | ICD-10-CM | POA: Diagnosis not present

## 2021-12-17 MED ORDER — DULOXETINE HCL 20 MG PO CPEP
20.0000 mg | ORAL_CAPSULE | Freq: Every day | ORAL | Status: DC
  Administered 2021-12-17: 20 mg via ORAL
  Filled 2021-12-17: qty 1

## 2021-12-17 MED ORDER — TAMSULOSIN HCL 0.4 MG PO CAPS: 0.4000 mg | ORAL_CAPSULE | Freq: Every day | ORAL | Status: DC

## 2021-12-17 MED ORDER — ALPRAZOLAM 0.25 MG PO TABS
2.0000 mg | ORAL_TABLET | Freq: Three times a day (TID) | ORAL | Status: DC | PRN
  Administered 2021-12-17 (×2): 2 mg via ORAL
  Filled 2021-12-17 (×2): qty 8
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
## 2021-12-17 NOTE — Discharge Instructions (Addendum)
It was our pleasure to provide your ER care today - we hope that you feel better.  Avoid alcohol use - follow up with AA, and use resource guide provided as relates other treatment program options in the area.   Also follow up closely with both primary care doctor and behavioral health provider in the next 1-2 weeks - call office today to arrange appointment.   For mental health issues and/or crisis, you may also go directly to the [facility] - it is open 24/7 and walk-ins are welcome.   Return to ER if worse, new symptoms, high fevers, new/severe pain, chest pain, trouble breathing, or other concern.
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
## 2021-12-17 NOTE — ED Provider Notes (Signed)
Emergency Medicine Observation Re-evaluation Note  Austin Dillon is a 71 y.o. male, seen on rounds today.  Pt initially presented to the ED for complaints of Psychiatric Evaluation Currently, the patient is calm, conversant and cooperative.  Patient observed in ED overnight - no agitated or aggressive behaviors while in ED. Pt denies any new/acute problems. Denies any thoughts of harm to others, or self.   Physical Exam  BP (!) 145/69 (BP Location: Right Arm)    Pulse 70    Temp 98.4 F (36.9 C) (Oral)    Resp 16    SpO2 96%  Physical Exam General: alert, content, cooperative. Cardiac: regular rate Lungs: breathing comfortably Psych: patient with normal mood and affect. Does not appear angry, agitated, hostile, depressed, or despondent. Pt denies any thoughts of harm to self or others. Thought processes appear clear. Patient does not appear to be responding to internal stimuli - no delusions or hallucinations are noted.   ED Course / MDM    I have reviewed the labs performed to date as well as medications administered while in observation.  Recent changes in the last 24 hours include ED observation and reassessment.   Plan  Patient exhibits normal mood and affect, and is having no thoughts of harm to self or others, there is no acute psychosis.   From history, ?whether possibly agitated behavior/mood disorder associated with etoh use/overuse.   Rec etoh cessation - resource guide provided, treatment options discussed.  Also rec outpatient [facility] and pcp f/u.  Return precautions provided.        Lajean Saver, MD 12/17/21 [phone]
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
## 2021-12-17 NOTE — ED Notes (Signed)
2nd message left for wife concerning pt discharge.

## 2021-12-17 NOTE — ED Notes (Signed)
Attempted to call wife for discharge.  No answer, message left.  Will attempt to reach her again.

## 2021-12-28 ENCOUNTER — Telehealth (HOSPITAL_COMMUNITY): Payer: Self-pay | Admitting: Internal Medicine

## 2021-12-28 NOTE — BH Assessment (Signed)
Care Management - [facility] Follow Up Discharges   Writer attempted to make contact with patient today and was unsuccessful.  Writer left a HIPPA compliant voice message.   Per chart review, [facility]

## 2022-01-19 ENCOUNTER — Encounter (HOSPITAL_COMMUNITY): Payer: Self-pay

## 2022-01-19 ENCOUNTER — Other Ambulatory Visit: Payer: Self-pay

## 2022-01-19 ENCOUNTER — Inpatient Hospital Stay (HOSPITAL_COMMUNITY)
Admission: EM | Admit: 2022-01-19 | Discharge: 2022-01-27 | DRG: 896 | Disposition: A | Discharge status: Home / Self Care | Payer: No Typology Code available for payment source | Attending: Internal Medicine | Admitting: Internal Medicine

## 2022-01-19 ENCOUNTER — Emergency Department (HOSPITAL_COMMUNITY): Payer: No Typology Code available for payment source

## 2022-01-19 DIAGNOSIS — Y900 Blood alcohol level of less than 20 mg/100 ml: Secondary | ICD-10-CM | POA: Diagnosis present

## 2022-01-19 DIAGNOSIS — F0280 Dementia in other diseases classified elsewhere without behavioral disturbance: Secondary | ICD-10-CM | POA: Diagnosis present

## 2022-01-19 DIAGNOSIS — F10231 Alcohol dependence with withdrawal delirium: Secondary | ICD-10-CM | POA: Diagnosis not present

## 2022-01-19 DIAGNOSIS — M79601 Pain in right arm: Secondary | ICD-10-CM | POA: Diagnosis not present

## 2022-01-19 DIAGNOSIS — Z96641 Presence of right artificial hip joint: Secondary | ICD-10-CM | POA: Diagnosis present

## 2022-01-19 DIAGNOSIS — G309 Alzheimer's disease, unspecified: Secondary | ICD-10-CM | POA: Diagnosis present

## 2022-01-19 DIAGNOSIS — F10939 Alcohol use, unspecified with withdrawal, unspecified: Secondary | ICD-10-CM | POA: Diagnosis present

## 2022-01-19 DIAGNOSIS — M25512 Pain in left shoulder: Secondary | ICD-10-CM | POA: Diagnosis present

## 2022-01-19 DIAGNOSIS — D72829 Elevated white blood cell count, unspecified: Secondary | ICD-10-CM | POA: Diagnosis present

## 2022-01-19 DIAGNOSIS — Z20822 Contact with and (suspected) exposure to covid-19: Secondary | ICD-10-CM | POA: Diagnosis present

## 2022-01-19 DIAGNOSIS — F418 Other specified anxiety disorders: Secondary | ICD-10-CM | POA: Diagnosis present

## 2022-01-19 DIAGNOSIS — R55 Syncope and collapse: Secondary | ICD-10-CM | POA: Diagnosis present

## 2022-01-19 DIAGNOSIS — F02A3 Dementia in other diseases classified elsewhere, mild, with mood disturbance: Secondary | ICD-10-CM | POA: Diagnosis present

## 2022-01-19 DIAGNOSIS — W1811XA Fall from or off toilet without subsequent striking against object, initial encounter: Secondary | ICD-10-CM | POA: Diagnosis present

## 2022-01-19 DIAGNOSIS — F17210 Nicotine dependence, cigarettes, uncomplicated: Secondary | ICD-10-CM | POA: Diagnosis present

## 2022-01-19 DIAGNOSIS — Z79899 Other long term (current) drug therapy: Secondary | ICD-10-CM

## 2022-01-19 DIAGNOSIS — Z888 Allergy status to other drugs, medicaments and biological substances status: Secondary | ICD-10-CM

## 2022-01-19 DIAGNOSIS — F10239 Alcohol dependence with withdrawal, unspecified: Principal | ICD-10-CM | POA: Diagnosis present

## 2022-01-19 DIAGNOSIS — I808 Phlebitis and thrombophlebitis of other sites: Secondary | ICD-10-CM | POA: Diagnosis not present

## 2022-01-19 DIAGNOSIS — G9341 Metabolic encephalopathy: Secondary | ICD-10-CM | POA: Diagnosis present

## 2022-01-19 DIAGNOSIS — Z781 Physical restraint status: Secondary | ICD-10-CM

## 2022-01-19 DIAGNOSIS — Z89511 Acquired absence of right leg below knee: Secondary | ICD-10-CM

## 2022-01-19 DIAGNOSIS — L03113 Cellulitis of right upper limb: Secondary | ICD-10-CM | POA: Diagnosis not present

## 2022-01-19 DIAGNOSIS — L0390 Cellulitis, unspecified: Secondary | ICD-10-CM

## 2022-01-19 DIAGNOSIS — I10 Essential (primary) hypertension: Secondary | ICD-10-CM | POA: Diagnosis present

## 2022-01-19 DIAGNOSIS — R197 Diarrhea, unspecified: Secondary | ICD-10-CM | POA: Diagnosis present

## 2022-01-19 DIAGNOSIS — R0602 Shortness of breath: Secondary | ICD-10-CM

## 2022-01-19 DIAGNOSIS — J9811 Atelectasis: Secondary | ICD-10-CM | POA: Diagnosis present

## 2022-01-19 DIAGNOSIS — Z9049 Acquired absence of other specified parts of digestive tract: Secondary | ICD-10-CM

## 2022-01-19 DIAGNOSIS — F32A Depression, unspecified: Secondary | ICD-10-CM | POA: Diagnosis present

## 2022-01-19 LAB — BASIC METABOLIC PANEL
Anion gap: 13 (ref 5–15)
Anion gap: 9 (ref 5–15)
BUN: 15 mg/dL (ref 8–23)
BUN: 14 mg/dL (ref 8–23)
CO2: 16 mmol/L — ABNORMAL LOW (ref 22–32)
CO2: 23 mmol/L (ref 22–32)
Calcium: 8.4 mg/dL — ABNORMAL LOW (ref 8.9–10.3)
Calcium: 9.1 mg/dL (ref 8.9–10.3)
Chloride: 101 mmol/L (ref 98–111)
Chloride: 102 mmol/L (ref 98–111)
Creatinine, Ser: 0.96 mg/dL (ref 0.61–1.24)
Creatinine, Ser: 0.88 mg/dL (ref 0.61–1.24)
GFR, Estimated: >60 mL/min (ref >60)
GFR, Estimated: >60 mL/min (ref >60)
Glucose, Bld: 240 mg/dL — ABNORMAL HIGH (ref 70–99)
Glucose, Bld: 137 mg/dL — ABNORMAL HIGH (ref 70–99)
Potassium: 7 mmol/L (ref 3.5–5.1)
Potassium: 4.9 mmol/L (ref 3.5–5.1)
Sodium: 130 mmol/L — ABNORMAL LOW (ref 135–145)
Sodium: 134 mmol/L — ABNORMAL LOW (ref 135–145)

## 2022-01-19 LAB — CBC
HCT: 47 % (ref 39.0–52.0)
Hemoglobin: 16.3 g/dL (ref 13.0–17.0)
MCH: 34.5 pg — ABNORMAL HIGH (ref 26.0–34.0)
MCHC: 34.7 g/dL (ref 30.0–36.0)
MCV: 99.4 fL (ref 80.0–100.0)
Platelets: 278 10*3/uL (ref 150–400)
RBC: 4.73 MIL/uL (ref 4.22–5.81)
RDW: 13.4 % (ref 11.5–15.5)
WBC: 19 10*3/uL — ABNORMAL HIGH (ref 4.0–10.5)
nRBC: 0 % (ref 0.0–0.2)

## 2022-01-19 LAB — URINALYSIS, ROUTINE W REFLEX MICROSCOPIC
Bacteria, UA: NONE SEEN
Bilirubin Urine: NEGATIVE
Glucose, UA: 50 mg/dL — AB
Hgb urine dipstick: NEGATIVE
Ketones, ur: NEGATIVE mg/dL
Leukocytes,Ua: NEGATIVE
Nitrite: NEGATIVE
Protein, ur: 30 mg/dL — AB
Specific Gravity, Urine: 1.023 (ref 1.005–1.030)
pH: 5 (ref 5.0–8.0)

## 2022-01-19 LAB — HEPATIC FUNCTION PANEL
ALT: 26 U/L (ref 0–44)
AST: 40 U/L (ref 15–41)
Albumin: 3.7 g/dL (ref 3.5–5.0)
Alkaline Phosphatase: 73 U/L (ref 38–126)
Bilirubin, Direct: 0.5 mg/dL — ABNORMAL HIGH (ref 0.0–0.2)
Indirect Bilirubin: 1.7 mg/dL — ABNORMAL HIGH (ref 0.3–0.9)
Total Bilirubin: 2.2 mg/dL — ABNORMAL HIGH (ref 0.3–1.2)
Total Protein: 7 g/dL (ref 6.5–8.1)

## 2022-01-19 LAB — AMMONIA: Ammonia: 28 umol/L (ref 9–35)

## 2022-01-19 LAB — ETHANOL: Alcohol, Ethyl (B): <10 mg/dL (ref <10)

## 2022-01-19 LAB — CBG MONITORING, ED: Glucose-Capillary: 227 mg/dL — ABNORMAL HIGH (ref 70–99)

## 2022-01-19 MED ORDER — ENOXAPARIN SODIUM 40 MG/0.4ML IJ SOSY
40.0000 mg | PREFILLED_SYRINGE | INTRAMUSCULAR | Status: DC
  Administered 2022-01-20 – 2022-01-26 (×7): 40 mg via SUBCUTANEOUS
  Filled 2022-01-19 (×8): qty 0.4

## 2022-01-19 MED ORDER — FOLIC ACID 1 MG PO TABS
1.0000 mg | ORAL_TABLET | Freq: Every day | ORAL | Status: DC
  Administered 2022-01-20 – 2022-01-27 (×8): 1 mg via ORAL
  Filled 2022-01-19 (×9): qty 1

## 2022-01-19 MED ORDER — LORAZEPAM 2 MG/ML IJ SOLN
1.0000 mg | INTRAMUSCULAR | Status: AC | PRN
  Administered 2022-01-20 (×4): 2 mg via INTRAVENOUS
  Administered 2022-01-21: 4 mg via INTRAVENOUS
  Administered 2022-01-21: 3 mg via INTRAVENOUS
  Filled 2022-01-19: qty 2
  Filled 2022-01-19 (×3): qty 1
  Filled 2022-01-19: qty 2
  Filled 2022-01-19: qty 1

## 2022-01-19 MED ORDER — SODIUM CHLORIDE 0.9 % IV BOLUS
1000.0000 mL | Freq: Once | INTRAVENOUS | Status: AC
  Administered 2022-01-19: 1000 mL via INTRAVENOUS

## 2022-01-19 MED ORDER — LORAZEPAM 2 MG/ML IJ SOLN
1.0000 mg | Freq: Once | INTRAMUSCULAR | Status: AC
  Administered 2022-01-19: 1 mg via INTRAVENOUS
  Filled 2022-01-19: qty 1

## 2022-01-19 MED ORDER — ACETAMINOPHEN 650 MG RE SUPP: 650.0000 mg | Freq: Four times a day (QID) | RECTAL | Status: DC | PRN

## 2022-01-19 MED ORDER — ADULT MULTIVITAMIN W/MINERALS CH
1.0000 | ORAL_TABLET | Freq: Every day | ORAL | Status: DC
  Administered 2022-01-20 – 2022-01-27 (×8): 1 via ORAL
  Filled 2022-01-19 (×8): qty 1

## 2022-01-19 MED ORDER — LORAZEPAM 1 MG PO TABS
1.0000 mg | ORAL_TABLET | ORAL | Status: AC | PRN
  Administered 2022-01-20: 1 mg via ORAL
  Filled 2022-01-19: qty 1

## 2022-01-19 MED ORDER — THIAMINE HCL 100 MG PO TABS
100.0000 mg | ORAL_TABLET | Freq: Every day | ORAL | Status: DC
  Administered 2022-01-20 – 2022-01-27 (×8): 100 mg via ORAL
  Filled 2022-01-19 (×8): qty 1

## 2022-01-19 MED ORDER — ACETAMINOPHEN 325 MG PO TABS: 650.0000 mg | ORAL_TABLET | Freq: Four times a day (QID) | ORAL | Status: DC | PRN

## 2022-01-19 MED ORDER — THIAMINE HCL 100 MG/ML IJ SOLN
100.0000 mg | Freq: Once | INTRAMUSCULAR | Status: AC
  Administered 2022-01-19: 100 mg via INTRAVENOUS
  Filled 2022-01-19: qty 2

## 2022-01-19 MED ORDER — THIAMINE HCL 100 MG/ML IJ SOLN: 100.0000 mg | Freq: Every day | INTRAMUSCULAR | Status: DC

## 2022-01-19 MED ORDER — DIAZEPAM 5 MG/ML IJ SOLN
5.0000 mg | Freq: Once | INTRAMUSCULAR | Status: AC
  Administered 2022-01-19: 5 mg via INTRAVENOUS
  Filled 2022-01-19: qty 2

## 2022-01-19 NOTE — ED Notes (Signed)
PA at bedside.

## 2022-01-19 NOTE — ED Triage Notes (Signed)
BIB [facility] after wife called to report pt having had a witnessed syncopal episode. Per EMS, pt increasing lethargic, poor appetite, diarrhea. Pt had one episode of vomiting. Pt given 250 cc bolus en route.   Hx: anxiety, dementia

## 2022-01-19 NOTE — Assessment & Plan Note (Addendum)
-   Resume Cymbalta

## 2022-01-19 NOTE — Assessment & Plan Note (Addendum)
Syncope -Suspected alcohol withdrawal seizure, less likely vasovagal episode -EEG and MRI brain were unremarkable -Echo with preserved EF, no significant valvular disease -Increase activity as tolerated

## 2022-01-19 NOTE — ED Provider Notes (Signed)
[facility] EMERGENCY DEPARTMENT Provider Note   CSN: [id number] Arrival date & time: 01/19/22  1427     History  Chief Complaint  Patient presents with   [name]    Austin Dillon is a 71 y.o. male with history of Alzheimer disease, arthritis.  Patient presents to ED via EMS after supposedly having syncopal episode at home.  Patient is accompanied by his wife who provides some of the history.  Per patient wife, the patient was sitting on the toilet having a bowel movement when he all of a sudden lost consciousness and fell off of the commode onto his left side.  The patient is now complaining of left shoulder pain, he is afraid that he has dislocated the shoulder..  Patient's wife states the patient then had 4 to 5 minutes of "jerking" with his eyes rolling in the back of his head.  Patient denies seizure history.  Patient wife states that patient then regained consciousness, she is unsure if the patient was confused at this time.  She denies any bowel or bladder incontinence.  Patient was then transported to the ED for further evaluation.  On chart review, this patient was discharged from the facility on 1/13 after being IVC for aggressive behavior towards his wife.  The patient has a history of dementia, this is thought to have been playing a part in the patient with aggressive behavior.  The patient supposedly stopped drinking alcohol abruptly 2 days ago as well as taking Xanax medication because his wife told him that "she would leave him" if he continued to drink alcohol.  The patient was a 18 beer per day drinker.  The patient had been taking Xanax 3 times a day for an unknown amount of time.  On examination, the patient is complaining of lightheadedness, dizziness, weakness, anxiety.  Patient denies chest pain, shortness of breath, abdominal pain, headache.   Fall Pertinent negatives include no chest pain, no abdominal pain, no headaches and no shortness of breath.       Home Medications Prior to Admission medications   Medication Sig Start Date End Date Taking? Authorizing Provider  acetaminophen (TYLENOL) 500 MG tablet Take 500 mg by mouth every 4 (four) hours as needed for mild pain or headache. 11/17/16   [provider]  alprazolam Duanne Moron) 2 MG tablet Take 2 mg by mouth 3 (three) times daily.    [provider]  DULoxetine (CYMBALTA) 20 MG capsule Take 20 mg by mouth daily.    [provider]  tamsulosin (FLOMAX) 0.4 MG CAPS capsule Take 1 capsule (0.4 mg total) by mouth daily after supper. 10/07/21   Billey Co, MD      Allergies    Nortriptyline and Aricept [donepezil]    Review of Systems   Review of Systems  Constitutional:  Negative for fever.  Respiratory:  Negative for shortness of breath.   Cardiovascular:  Negative for chest pain.  Gastrointestinal:  Positive for diarrhea, nausea and vomiting. Negative for abdominal pain.  Neurological:  Positive for dizziness, tremors, syncope, weakness and light-headedness. Negative for headaches.  Psychiatric/Behavioral:  The patient is nervous/anxious.   All other systems reviewed and are negative.  Physical Exam Updated Vital Signs BP (!) 151/77 (BP Location: Right Arm)    Pulse 70    Temp 98.4 F (36.9 C) (Oral)    Resp (!) 22    Ht 5\' 8"  (1.727 m)    Wt 77.1 kg    SpO2  93%    BMI 25.85 kg/m  Physical Exam Vitals and nursing note reviewed.  HENT:     Head: Normocephalic and atraumatic.     Nose: Nose normal.     Mouth/Throat:     Mouth: Mucous membranes are moist.  Eyes:     Extraocular Movements: Extraocular movements intact.     Conjunctiva/sclera: Conjunctivae normal.     Pupils: Pupils are equal, round, and reactive to light.  Neck:     Comments: No C-spine tenderness, deformity, step-off Cardiovascular:     Rate and Rhythm: Normal rate and regular rhythm.  Pulmonary:     Effort: Pulmonary effort is normal.     Breath sounds: Normal breath sounds.  No wheezing or rales.  Abdominal:     General: Abdomen is flat.     Palpations: Abdomen is soft.     Tenderness: There is no abdominal tenderness.     Comments: Prominent xiphoid palpated in epigastric region  Musculoskeletal:     Right shoulder: Normal.     Left shoulder: Tenderness present. No swelling or deformity. Normal range of motion.     Cervical back: Normal range of motion and neck supple. No tenderness.     Comments: Patient has full range of motion of left shoulder.  Left shoulder is tender to touch.     Right Lower Extremity: Right leg is amputated below knee.  Skin:    General: Skin is warm and dry.     Capillary Refill: Capillary refill takes less than 2 seconds.  Neurological:     Mental Status: He is alert and oriented to person, place, and time.     GCS: GCS eye subscore is 4. GCS verbal subscore is 5. GCS motor subscore is 6.     Cranial Nerves: Cranial nerves 2-12 are intact. No cranial nerve deficit.     Sensory: Sensation is intact. No sensory deficit.     Motor: Motor function is intact. No weakness.     Coordination: Coordination is intact. Heel to Shin Test normal.    ED Results / Procedures / Treatments   Labs (all labs ordered are listed, but only abnormal results are displayed) Labs Reviewed  BASIC METABOLIC PANEL - Abnormal; Notable for the following components:      Result Value   Sodium 130 (*)    Potassium 7.0 (*)    CO2 16 (*)    Glucose, Bld 240 (*)    Calcium 8.4 (*)    All other components within normal limits  CBC - Abnormal; Notable for the following components:   WBC 19.0 (*)    MCH 34.5 (*)    All other components within normal limits  URINALYSIS, ROUTINE W REFLEX MICROSCOPIC - Abnormal; Notable for the following components:   Glucose, UA 50 (*)    Protein, ur 30 (*)    All other components within normal limits  BASIC METABOLIC PANEL - Abnormal; Notable for the following components:   Sodium 134 (*)    Glucose, Bld 137 (*)    All  other components within normal limits  HEPATIC FUNCTION PANEL - Abnormal; Notable for the following components:   Total Bilirubin 2.2 (*)    Bilirubin, Direct 0.5 (*)    Indirect Bilirubin 1.7 (*)    All other components within normal limits  CBG MONITORING, ED - Abnormal; Notable for the following components:   Glucose-Capillary 227 (*)    All other components within normal limits  AMMONIA  ETHANOL  EKG EKG Interpretation  Date/Time:  Wednesday January 19 2022 14:56:06 EST Ventricular Rate:  76 PR Interval:  148 QRS Duration: 84 QT Interval:  382 QTC Calculation: 429 R Axis:   41 Text Interpretation: Normal sinus rhythm Normal ECG When compared with ECG of 16-Dec-2021 20:17, PREVIOUS ECG IS PRESENT No significant change since last tracing Confirmed by Davonna Belling [phone]) on 01/19/2022 4:13:46 PM  Radiology DG Abdomen 1 View  Result Date: 01/19/2022 CLINICAL DATA:  Trauma EXAM: ABDOMEN - 1 VIEW COMPARISON:  CT done on 09/23/2021 FINDINGS: Bowel gas pattern is nonspecific. No abnormal masses or calcifications are seen. Kidneys are partly obscured by bowel contents. Degenerative changes are noted at lumbosacral junction. There is previous right hip arthroplasty. IMPRESSION: Nonspecific bowel gas pattern. Electronically Signed   By: Elmer Picker M.D.   On: 01/19/2022 16:51   CT Head Wo Contrast  Result Date: 01/19/2022 CLINICAL DATA:  Seizure. EXAM: CT HEAD WITHOUT CONTRAST TECHNIQUE: Contiguous axial images were obtained from the base of the skull through the vertex without intravenous contrast. RADIATION DOSE REDUCTION: This exam was performed according to the departmental dose-optimization program which includes automated exposure control, adjustment of the mA and/or kV according to patient size and/or use of iterative reconstruction technique. COMPARISON:  Head CT dated 05/18/2018. FINDINGS: Brain: Mild age-related atrophy and chronic microvascular ischemic changes.  There is no acute intracranial hemorrhage. No mass effect or midline shift. No extra-axial fluid collection. Vascular: No hyperdense vessel or unexpected calcification. Skull: Normal. Negative for fracture or focal lesion. Sinuses/Orbits: No acute finding. Other: None IMPRESSION: 1. No acute intracranial pathology. 2. Mild age-related atrophy and chronic microvascular ischemic changes. Electronically Signed   By: Anner Crete M.D.   On: 01/19/2022 20:11   DG Shoulder Left  Result Date: 01/19/2022 CLINICAL DATA:  Trauma EXAM: LEFT SHOULDER - 2+ VIEW COMPARISON:  07/15/2021 FINDINGS: No fracture or dislocation is seen. Degenerative changes are noted in the left [facility] joint with bony spurs. There are no abnormal soft tissue calcifications. IMPRESSION: No fracture or dislocation is seen in the left shoulder. Electronically Signed   By: Elmer Picker M.D.   On: 01/19/2022 16:48    Procedures Procedures    Medications Ordered in ED Medications  LORazepam (ATIVAN) injection 1 mg (has no administration in time range)  sodium chloride 0.9 % bolus 1,000 mL (1,000 mLs Intravenous New Bag/Given 01/19/22 1754)  diazepam (VALIUM) injection 5 mg (5 mg Intravenous Given 01/19/22 1745)  thiamine (B-1) injection 100 mg (100 mg Intravenous Given 01/19/22 1749)    ED Course/ Medical Decision Making/ A&P                           Medical Decision Making Amount and/or Complexity of Data Reviewed Labs: ordered. Radiology: ordered.  Risk Prescription drug management.   71 year old male with history of Alzheimer's dementia presents to ED for evaluation of syncopal episode.  On examination, the patient is afebrile, nontachycardic, nonhypoxic, nontoxic in appearance, clear lung sounds bilaterally, soft compressible abdomen.  The patient is alert and oriented x4.  The patient's wife states that the patient usually drinks 18 beers a day and has done so for an unknown amount of time.  He also takes 3 Xanax  pills each day and has done so for an unknown amount of time.  The patient was noted to abruptly have stopped this 2 days ago because his wife stated that she "would leave him" if  he did not.  The patient is tremulous on exam, complaining of anxiety as well as tactile sensations in the form of burning, numbness and needles to his hands.  The patient is also complaining of a headache as well as nausea and vomiting.  Patient worked up utilizing the following studies, labs and imaging tools that were interpreted by me: UA, ammonia, ethanol, hepatic function panel, BMP, C BC, CT head, plain film abdomen, plain film shoulder.  UA: Significant for glucose and protein in urine Ammonia: 28, unremarkable Ethanol: Less than 10 Hepatic function panel: Elevated total bilirubin at 2.2, elevated direct bilirubin 1.5, elevated indirect bilirubin 1.7 BMP: Unremarkable  CBC: Shows elevated white blood cell count of 19.  The patient is denying any fevers, he is endorsing diarrhea CT head: Chronic small vessel ischemia.  No signs of intracranial abnormality or acute bleed Abdomen: Nonspecific bowel gas pattern Shoulder: No sign of fracture or dislocation Initial CIWA score 11 Second CIWA score 8  At this time, I feel that this patient warrants an admission for alcohol withdrawal and stabilization.  I have consulted the hospitalist and awaiting the callback.  Spoke with Dr. Hermenia Bers who agreed to see and admit the patient. Patient stable at time of admission.     Final Clinical Impression(s) / ED Diagnoses Final diagnoses:  Alcohol withdrawal syndrome with complication [facility])    Rx / DC Orders ED Discharge Orders     None         Lawana Chambers 01/19/22 2054    Davonna Belling, MD 01/19/22 2243

## 2022-01-19 NOTE — Assessment & Plan Note (Addendum)
-  Reportedly started worsening alcohol withdrawals from 2/17 -Improving on high-dose scheduled Librium and as needed Haldol -Improving, cut down Librium dose -Increase activity as tolerated

## 2022-01-19 NOTE — Assessment & Plan Note (Addendum)
Diarrhea -Both resolved, likely reactive

## 2022-01-19 NOTE — ED Notes (Signed)
Patient transported to X-ray 

## 2022-01-19 NOTE — H&P (Signed)
History and Physical    Austin Dillon SLH:[id number] DOB: Apr 16, 1951 DOA: 01/19/2022  PCP: Baxter Hire, MD  Patient coming from: Home  HPI: Austin Dillon is a 71 y.o. 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  EMS reported lethargy, poor appetite, and diarrhea.  He had 1 episode of vomiting in route and was given a 250 cc fluid bolus.  Wife reported to ED provider that patient was sitting on the toilet having a bowel movement when all of a sudden he lost consciousness and fell off the commode onto his left side.  Patient complained of left shoulder pain.  Wife reported that patient had 4-5 minutes of jerking with his eyes rolling back in his head.  Wife unsure if the patient was confused after regaining consciousness.  No bowel or bladder incontinence reported.  Patient was recently discharged from the facility on 1/13 after being IVC for aggressive behavior toward his wife.  Wife reported that patient stopped drinking alcohol abruptly 2 days ago and stopped taking Xanax which is a chronic medication.  He normally drinks 18 beers per day.  In the ED, slightly hypertensive but remainder of vital signs stable.  Labs showing WBC 19.0.  Initial potassium 7.0 (sample hemolyzed), repeat 4.9.  T. bili 2.2, remainder of LFTs normal.  Ammonia level normal.  Blood ethanol level undetectable.  UA without signs of infection.  CT head negative for acute finding.  Abdominal x-ray showing nonspecific bowel gas pattern.  X-ray of left shoulder negative for fracture or dislocation.  EKG without acute ischemic changes. Patient was given diazepam, thiamine, and 1 L normal saline bolus.  TRH called to admit due to concern for alcohol withdrawal.  Patient states he passed out while trying to have a bowel movement.  Does report dizziness prior  to losing consciousness.  Denies chest pain or shortness of breath.  States he vomited in the ambulance.  Denies abdominal pain but does report ongoing diarrhea for the past 1 month.  Reports drinking 7 or 8 beers daily but stopped drinking 2 days ago.  Also reports taking Xanax 3 times a day but stopped taking it 2 days ago.  Per patient, he ran out of Xanax.  Review of Systems:  All systems reviewed and apart from history of presenting illness, are negative.  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[place]), chronic  02/04/2020    Past Surgical History:  Procedure Laterality Date   AMPUTATION Right 02/04/2020   Procedure: AMPUTATION BELOW KNEE;  Surgeon: Altamese Damascus, MD;  Location: [facility];  Service: Orthopedics;  Laterality: Right;   APPENDECTOMY     COLONOSCOPY     COLONOSCOPY WITH PROPOFOL N/A 09/28/2021   Procedure: COLONOSCOPY WITH PROPOFOL;  Surgeon: Lesly Rubenstein, MD;  Location: [facility];  Service: Endoscopy;  Laterality: N/A;   ESOPHAGOGASTRODUODENOSCOPY (EGD) WITH PROPOFOL N/A 09/28/2021   Procedure:  ESOPHAGOGASTRODUODENOSCOPY (EGD) WITH PROPOFOL;  Surgeon: Lesly Rubenstein, MD;  Location: [facility];  Service: Endoscopy;  Laterality: N/A;   EXTERNAL FIXATION LEG Right 03/26/2017   Procedure: EXTERNAL FIXATION RIGHT LEG;  Surgeon: Mcarthur Rossetti, MD;  Location: [facility];  Service: Orthopedics;  Laterality: Right;   ORIF ANKLE FRACTURE Right 03/29/2017   Procedure: OPEN REDUCTION INTERNAL FIXATION (ORIF) RIGHT   ANKLE FRACTURE, Removal of External Fixator;  Surgeon: Newt Minion, MD;  Location: [facility];  Service: Orthopedics;  Laterality: Right;   TOTAL HIP ARTHROPLASTY Right 01/13/2018   Procedure: RIGHT ANTERIOR TOTAL HIP REPLACEMENT;  Surgeon: Mcarthur Rossetti, MD;  Location: [facility];  Service: Orthopedics;  Laterality: Right;     reports that he has been smoking cigarettes. He has a 37.50 pack-year smoking history. He has never used smokeless tobacco. He reports that he does not currently use alcohol. He reports that he does not use drugs.  Allergies  Allergen Reactions   Nortriptyline Other (See Comments)    Severe mood swings   Aricept [Donepezil] Other (See Comments)    Makes the patient look like a "mummy" (too sedating)    History reviewed. No pertinent family history.  Prior to Admission medications   Medication Sig Start Date End Date Taking? Authorizing Provider  acetaminophen (TYLENOL) 500 MG tablet Take 500 mg by mouth every 4 (four) hours as needed for mild pain or headache. 11/17/16   [provider]  alprazolam Duanne Moron) 2 MG tablet Take 2 mg by mouth 3 (three) times daily.    [provider]  DULoxetine (CYMBALTA) 20 MG capsule Take 20 mg by mouth daily.    [provider]  tamsulosin (FLOMAX) 0.4 MG CAPS capsule Take 1 capsule (0.4 mg total) by mouth daily after supper. 10/07/21   Billey Co, MD    Physical Exam: Vitals:   01/19/22 1430 01/19/22 1437 01/19/22 1833 01/19/22 1920  BP:  (!) 157/78  (!) 151/77  Pulse:  70  70  Resp:  18  (!) 22  Temp:  98.2 F (36.8 C)  98.4 F (36.9 C)  TempSrc:  Oral  Oral  SpO2: 94% 97%  93%  Weight:   77.1 kg   Height:   5\' 8"  (1.727 m)     Physical Exam Constitutional:      General: He is not in acute distress. HENT:     Head: Normocephalic and atraumatic.  Eyes:     Extraocular Movements: Extraocular movements intact.     Conjunctiva/sclera: Conjunctivae normal.  Cardiovascular:     Rate  and Rhythm: Normal rate and regular rhythm.     Pulses: Normal pulses.  Pulmonary:     Effort: Pulmonary effort is normal. No respiratory distress.     Breath sounds: Normal breath sounds. No wheezing or rales.  Abdominal:     General: There is no distension.     Palpations: Abdomen is soft.     Tenderness: There is no abdominal tenderness. There is no guarding or rebound.     Comments: Hyperactive bowel sounds  Musculoskeletal:        General: No swelling or tenderness.     Cervical back: Normal range of motion and neck supple.  Skin:    General: Skin is warm and dry.  Neurological:     General: No focal deficit present.     Mental Status: He is alert and oriented to person, place, and time.     Comments: Tremulous  Psychiatric:     Comments: Appears anxious     Labs on Admission: I have personally reviewed following labs and imaging studies  CBC: Recent Labs  Lab 01/19/22 1440  WBC 19.0*  HGB 16.3  HCT 47.0  MCV 99.4  PLT 161   Basic Metabolic Panel: Recent Labs  Lab 01/19/22 1440 01/19/22 1728  NA 130* 134*  K 7.0* 4.9  CL 101 102  CO2 16* 23  GLUCOSE 240* 137*  BUN 15 14  CREATININE 0.96 0.88  CALCIUM 8.4* 9.1   GFR: Estimated Creatinine Clearance: 75.6 mL/min (by C-G formula based on SCr of 0.88 mg/dL). Liver Function Tests: Recent Labs  Lab 01/19/22 1728  AST 40  ALT 26  ALKPHOS 73  BILITOT 2.2*  PROT 7.0  ALBUMIN 3.7   No results for input(s): LIPASE, AMYLASE in the last 168 hours. Recent Labs  Lab 01/19/22 1756  AMMONIA 28   Coagulation Profile: No results for input(s): INR, PROTIME in the last 168 hours. Cardiac Enzymes: No results for input(s): CKTOTAL, CKMB, CKMBINDEX, TROPONINI in the last 168 hours. BNP (last 3 results) No results for input(s): PROBNP in the last 8760 hours. HbA1C: No results for input(s): HGBA1C in the last 72 hours. CBG: Recent Labs  Lab 01/19/22 1444  GLUCAP 227*   Lipid Profile: No results for  input(s): CHOL, HDL, LDLCALC, TRIG, CHOLHDL, LDLDIRECT in the last 72 hours. Thyroid Function Tests: No results for input(s): TSH, T4TOTAL, FREET4, T3FREE, THYROIDAB in the last 72 hours. Anemia Panel: No results for input(s): VITAMINB12, FOLATE, FERRITIN, TIBC, IRON, RETICCTPCT in the last 72 hours. Urine analysis:    Component Value Date/Time   COLORURINE YELLOW 01/19/2022 1952   APPEARANCEUR CLEAR 01/19/2022 1952   LABSPEC 1.023 01/19/2022 1952   PHURINE 5.0 01/19/2022 1952   GLUCOSEU 50 (A) 01/19/2022 [place] NEGATIVE 01/19/2022 [place] NEGATIVE 01/19/2022 [place] NEGATIVE 01/19/2022 1952   PROTEINUR 30 (A) 01/19/2022 1952   NITRITE NEGATIVE 01/19/2022 1952   LEUKOCYTESUR NEGATIVE 01/19/2022 1952    Radiological Exams on Admission: I have personally reviewed images DG Abdomen 1 View  Result Date: 01/19/2022 CLINICAL DATA:  Trauma EXAM: ABDOMEN - 1 VIEW COMPARISON:  CT done on 09/23/2021 FINDINGS: Bowel gas pattern is nonspecific. No abnormal masses or calcifications are seen. Kidneys are partly obscured by bowel contents. Degenerative changes are noted at lumbosacral junction. There is previous right hip arthroplasty. IMPRESSION: Nonspecific bowel gas pattern. Electronically Signed   By: Elmer Picker M.D.   On: 01/19/2022 16:51   CT Head Wo Contrast  Result Date: 01/19/2022 CLINICAL DATA:  Seizure. EXAM: CT HEAD WITHOUT CONTRAST TECHNIQUE: Contiguous axial images were obtained from the base of the skull through the vertex without intravenous contrast. RADIATION DOSE REDUCTION: This exam was performed according to the departmental dose-optimization program which includes automated exposure control, adjustment of the mA and/or kV according to patient size and/or use of iterative reconstruction technique. COMPARISON:  Head CT dated 05/18/2018. FINDINGS: Brain: Mild age-related atrophy and chronic microvascular ischemic changes. There is no acute  intracranial hemorrhage. No mass effect or midline shift. No extra-axial fluid collection. Vascular: No hyperdense vessel or unexpected calcification. Skull: Normal. Negative for fracture or focal lesion. Sinuses/Orbits: No acute finding. Other: None IMPRESSION: 1. No acute intracranial pathology. 2. Mild age-related atrophy and chronic microvascular ischemic changes. Electronically Signed   By: Anner Crete M.D.   On: 01/19/2022 20:11   DG Shoulder Left  Result Date: 01/19/2022 CLINICAL DATA:  Trauma EXAM: LEFT SHOULDER - 2+ VIEW COMPARISON:  07/15/2021 FINDINGS: No fracture or dislocation is seen. Degenerative changes are noted in the left [facility] joint with bony spurs. There are no abnormal soft tissue calcifications. IMPRESSION: No fracture or dislocation is seen in the left shoulder. Electronically Signed   By: Elmer Picker M.D.   On: 01/19/2022 16:48    EKG: I have personally reviewed EKG: Sinus rhythm, no significant change since prior tracing.  Assessment and Plan: * Syncope- (present on admission) Likely vasovagal as it happened while patient was having a bowel movement.  Seizure is also on the differential as wife reported jerking and eyes rolling back, however, unclear whether he was postictal afterwards.  No bowel or bladder incontinence reported.  EKG showing sinus rhythm and no acute ischemic changes.  PE less likely given no tachycardia or hypoxia.  CT head negative for acute finding. -Echocardiogram, troponin, EEG, orthostatics, seizure and fall precautions  Alcohol withdrawal ([place])- (present on admission) Wife reported to ED provider that patient drinks heavily (18 beers per day) and stopped drinking abruptly 2 days ago.  Appears anxious and tremulous, slightly hypertensive. -CIWA protocol; Ativan as needed.  Thiamine, folate, and multivitamin.  Leukocytosis- (present on admission) Diarrhea No fever or signs of sepsis.  UA without signs of infection.  Not endorsing any  respiratory symptoms and lungs clear on exam.  C. difficile infection is on the differential given diarrhea and leukocytosis.  Patient is not endorsing any abdominal pain.  He has hyperactive bowel sounds on exam but no abdominal tenderness. -C. difficile PCR and GI pathogen panel, enteric precautions.  Continue to monitor WBC count.  Alzheimer's dementia ([place])- (present on admission) -Delirium precautions  Depression with anxiety- (present on admission) Concern for benzodiazepine withdrawal as patient takes Xanax 2 mg 3 times a day chronically and stopped taking it abruptly 2 days ago. -Resume home Xanax and Cymbalta after pharmacy med rec is done.  Patient was given diazepam in the ED.  Continue Ativan per CIWA protocol.   DVT prophylaxis: Lovenox Code Status: Full Code (discussed with the patient) Family Communication: No family available at this time. Level of care: Progressive Care Unit Admission status: It is my clinical opinion that referral for OBSERVATION is reasonable and necessary in this patient based on the above information provided. The aforementioned taken together are felt to place the patient at high risk for further clinical deterioration. However, it is anticipated that the patient may be medically stable for discharge from the hospital within 24 to 48 hours.   Shela Leff, MD [facility] 01/19/2022, 11:36 PM

## 2022-01-19 NOTE — Assessment & Plan Note (Signed)
Delirium precautions 

## 2022-01-20 ENCOUNTER — Observation Stay (HOSPITAL_COMMUNITY): Payer: No Typology Code available for payment source

## 2022-01-20 ENCOUNTER — Inpatient Hospital Stay (HOSPITAL_COMMUNITY): Payer: No Typology Code available for payment source

## 2022-01-20 DIAGNOSIS — F10931 Alcohol use, unspecified with withdrawal delirium: Secondary | ICD-10-CM | POA: Diagnosis not present

## 2022-01-20 DIAGNOSIS — Y900 Blood alcohol level of less than 20 mg/100 ml: Secondary | ICD-10-CM | POA: Diagnosis present

## 2022-01-20 DIAGNOSIS — G9341 Metabolic encephalopathy: Secondary | ICD-10-CM | POA: Diagnosis present

## 2022-01-20 DIAGNOSIS — M79601 Pain in right arm: Secondary | ICD-10-CM | POA: Diagnosis not present

## 2022-01-20 DIAGNOSIS — F17210 Nicotine dependence, cigarettes, uncomplicated: Secondary | ICD-10-CM | POA: Diagnosis present

## 2022-01-20 DIAGNOSIS — F02A4 Dementia in other diseases classified elsewhere, mild, with anxiety: Secondary | ICD-10-CM

## 2022-01-20 DIAGNOSIS — Z888 Allergy status to other drugs, medicaments and biological substances status: Secondary | ICD-10-CM | POA: Diagnosis not present

## 2022-01-20 DIAGNOSIS — Z20822 Contact with and (suspected) exposure to covid-19: Secondary | ICD-10-CM | POA: Diagnosis present

## 2022-01-20 DIAGNOSIS — G300 Alzheimer's disease with early onset: Secondary | ICD-10-CM

## 2022-01-20 DIAGNOSIS — I10 Essential (primary) hypertension: Secondary | ICD-10-CM | POA: Diagnosis present

## 2022-01-20 DIAGNOSIS — F10231 Alcohol dependence with withdrawal delirium: Secondary | ICD-10-CM | POA: Diagnosis not present

## 2022-01-20 DIAGNOSIS — Z781 Physical restraint status: Secondary | ICD-10-CM | POA: Diagnosis not present

## 2022-01-20 DIAGNOSIS — F10939 Alcohol use, unspecified with withdrawal, unspecified: Secondary | ICD-10-CM | POA: Diagnosis not present

## 2022-01-20 DIAGNOSIS — R55 Syncope and collapse: Secondary | ICD-10-CM | POA: Diagnosis present

## 2022-01-20 DIAGNOSIS — Z96641 Presence of right artificial hip joint: Secondary | ICD-10-CM | POA: Diagnosis present

## 2022-01-20 DIAGNOSIS — W1811XA Fall from or off toilet without subsequent striking against object, initial encounter: Secondary | ICD-10-CM | POA: Diagnosis present

## 2022-01-20 DIAGNOSIS — Z9049 Acquired absence of other specified parts of digestive tract: Secondary | ICD-10-CM | POA: Diagnosis not present

## 2022-01-20 DIAGNOSIS — Z79899 Other long term (current) drug therapy: Secondary | ICD-10-CM | POA: Diagnosis not present

## 2022-01-20 DIAGNOSIS — I808 Phlebitis and thrombophlebitis of other sites: Secondary | ICD-10-CM | POA: Diagnosis not present

## 2022-01-20 DIAGNOSIS — F10239 Alcohol dependence with withdrawal, unspecified: Secondary | ICD-10-CM | POA: Diagnosis present

## 2022-01-20 DIAGNOSIS — Z89511 Acquired absence of right leg below knee: Secondary | ICD-10-CM | POA: Diagnosis not present

## 2022-01-20 DIAGNOSIS — L03113 Cellulitis of right upper limb: Secondary | ICD-10-CM | POA: Diagnosis not present

## 2022-01-20 DIAGNOSIS — M25512 Pain in left shoulder: Secondary | ICD-10-CM | POA: Diagnosis present

## 2022-01-20 DIAGNOSIS — F418 Other specified anxiety disorders: Secondary | ICD-10-CM | POA: Diagnosis not present

## 2022-01-20 DIAGNOSIS — G309 Alzheimer's disease, unspecified: Secondary | ICD-10-CM | POA: Diagnosis present

## 2022-01-20 DIAGNOSIS — M7989 Other specified soft tissue disorders: Secondary | ICD-10-CM | POA: Diagnosis not present

## 2022-01-20 DIAGNOSIS — F32A Depression, unspecified: Secondary | ICD-10-CM | POA: Diagnosis present

## 2022-01-20 DIAGNOSIS — J9811 Atelectasis: Secondary | ICD-10-CM | POA: Diagnosis present

## 2022-01-20 DIAGNOSIS — F02A3 Dementia in other diseases classified elsewhere, mild, with mood disturbance: Secondary | ICD-10-CM | POA: Diagnosis present

## 2022-01-20 DIAGNOSIS — R197 Diarrhea, unspecified: Secondary | ICD-10-CM | POA: Diagnosis present

## 2022-01-20 LAB — CBC
HCT: 45.2 % (ref 39.0–52.0)
Hemoglobin: 15.3 g/dL (ref 13.0–17.0)
MCH: 33.9 pg (ref 26.0–34.0)
MCHC: 33.8 g/dL (ref 30.0–36.0)
MCV: 100.2 fL — ABNORMAL HIGH (ref 80.0–100.0)
Platelets: 233 10*3/uL (ref 150–400)
RBC: 4.51 MIL/uL (ref 4.22–5.81)
RDW: 13.3 % (ref 11.5–15.5)
WBC: 13.8 10*3/uL — ABNORMAL HIGH (ref 4.0–10.5)
nRBC: 0 % (ref 0.0–0.2)

## 2022-01-20 LAB — ECHOCARDIOGRAM COMPLETE
AR max vel: 2.75 cm2
AV Peak grad: 9.4 mmHg
Ao pk vel: 1.53 m/s
Area-P 1/2: 2.75 cm2
Calc EF: 63.9 %
Height: 68 in
P 1/2 time: 595 msec
S' Lateral: 3 cm
Single Plane A2C EF: 61.1 %
Single Plane A4C EF: 63.9 %
Weight: 2720 oz

## 2022-01-20 LAB — PROCALCITONIN: Procalcitonin: 0.12 ng/mL

## 2022-01-20 LAB — RESP PANEL BY RT-PCR (FLU A&B, COVID) ARPGX2
Influenza A by PCR: NEGATIVE
Influenza B by PCR: NEGATIVE
SARS Coronavirus 2 by RT PCR: NEGATIVE

## 2022-01-20 LAB — TROPONIN I (HIGH SENSITIVITY): Troponin I (High Sensitivity): 14 ng/L (ref <18)

## 2022-01-20 LAB — HIV ANTIBODY (ROUTINE TESTING W REFLEX): HIV Screen 4th Generation wRfx: NONREACTIVE

## 2022-01-20 MED ORDER — CHLORDIAZEPOXIDE HCL 5 MG PO CAPS
10.0000 mg | ORAL_CAPSULE | Freq: Three times a day (TID) | ORAL | Status: DC
  Administered 2022-01-20 (×3): 10 mg via ORAL
  Filled 2022-01-20 (×3): qty 2

## 2022-01-20 MED ORDER — NICOTINE 21 MG/24HR TD PT24
21.0000 mg | MEDICATED_PATCH | Freq: Every day | TRANSDERMAL | Status: DC
  Administered 2022-01-20 – 2022-01-26 (×7): 21 mg via TRANSDERMAL
  Filled 2022-01-20 (×7): qty 1

## 2022-01-20 MED ORDER — CARVEDILOL 6.25 MG PO TABS
6.2500 mg | ORAL_TABLET | Freq: Two times a day (BID) | ORAL | Status: DC
  Administered 2022-01-20 – 2022-01-27 (×14): 6.25 mg via ORAL
  Filled 2022-01-20 (×4): qty 1
  Filled 2022-01-20: qty 2
  Filled 2022-01-20 (×10): qty 1

## 2022-01-20 NOTE — Progress Notes (Signed)
Messaged RN and Dr. Candiss Norse to notify them of patient being unable to have EEG done until they are in a room due to patient being in hallway and on precautions.

## 2022-01-20 NOTE — Progress Notes (Signed)
Wife informed patient refused EEG earlier because he did not want to take his taupe off. Order was discontinued. Wife requesting EEG is performed. States  she will glue his taupe back on after EEG procedure. MD notified- new order placed for EEG.

## 2022-01-20 NOTE — ED Notes (Signed)
Attempted to call report to [id number]

## 2022-01-20 NOTE — Consult Note (Addendum)
Neurology Consultation  Reason for Consult: Seizure vs. syncope Referring Physician: Dr. Candiss Norse   CC: Fall yesterday  History is obtained from: Chart review, Patient's wife via telephone   HPI: Austin Dillon is a 70 y.o. 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 Per patient's wife, yesterday patient went to the restroom at home and shortly after, she heard things falling and a thud. She knocked and asked if he was okay but did not get a response. When she entered the bathroom, she states that he was stuck between the bathtub and commode with his hands drawn up towards his chest, his face looked scared, and she states that he was paralyzed. At that time, she noted that he had just had a watery bowel movement in the commode prior to falling. She states that she tried touching him and he just began to yell out with garbled speech. This lasted approximately 5 minutes before he lost consciousness for about 5 minutes with his eyes rolled back in his head. After regaining consciousness, she states that he opened his eyes and began to look around, confused, but unable to speak and began to shake with his upper body and arms. She also noted that he appeared a gray color, his face was twisted up, and he was cold, clammy, and diaphoretic.   Regarding his social history, the patient reportedly had started drinking alcohol again after a 6 month hiatus approximately 4 months ago. On a typical day, he drinks about 18 beers daily and 2 days prior to hospital arrival he had 36 beers. She states that he has quit alcohol abruptly before for 5-6 months at a time without withdrawal. He also takes Xanax 3 times a day but that he often takes this medication more often than prescribed. He ran out of his prescription a  week or two ago and had been taking his wife's medication. His last reported Xanax was on 2/14.   ROS: A complete ROS was performed and is negative except as noted in the HPI.   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[place]), chronic  02/04/2020   Past Surgical History:  Procedure Laterality Date   AMPUTATION Right 02/04/2020   Procedure: AMPUTATION BELOW KNEE;  Surgeon: Altamese Goldstream, MD;  Location: [place];  Service: Orthopedics;  Laterality: Right;   APPENDECTOMY     COLONOSCOPY     COLONOSCOPY WITH PROPOFOL N/A 09/28/2021   Procedure: COLONOSCOPY WITH PROPOFOL;  Surgeon: Lesly Rubenstein, MD;  Location: [facility];  Service: Endoscopy;  Laterality: N/A;   ESOPHAGOGASTRODUODENOSCOPY (EGD) WITH PROPOFOL N/A 09/28/2021   Procedure: ESOPHAGOGASTRODUODENOSCOPY (EGD) WITH PROPOFOL;  Surgeon: Lesly Rubenstein, MD;  Location: [facility];  Service: Endoscopy;  Laterality: N/A;   EXTERNAL FIXATION LEG Right 03/26/2017   Procedure: EXTERNAL FIXATION RIGHT LEG;  Surgeon: Mcarthur Rossetti, MD;  Location: [place];  Service: Orthopedics;  Laterality: Right;   ORIF ANKLE FRACTURE Right 03/29/2017   Procedure: OPEN REDUCTION INTERNAL FIXATION (ORIF) RIGHT  ANKLE FRACTURE, Removal of External Fixator;  Surgeon: Newt Minion, MD;  Location: [place];  Service: Orthopedics;  Laterality: Right;   TOTAL HIP ARTHROPLASTY Right 01/13/2018   Procedure: RIGHT ANTERIOR TOTAL HIP REPLACEMENT;  Surgeon: Mcarthur Rossetti, MD;  Location: [place];   Service: Orthopedics;  Laterality: Right;   History reviewed. No pertinent family history.  Social History:   reports that he has been smoking cigarettes. He has a 37.50 pack-year smoking history. He has never used smokeless tobacco. He reports that he does not currently use alcohol. He reports that he does not use drugs.  Medications  Current Facility-Administered Medications:    acetaminophen (TYLENOL) tablet 650 mg, 650 mg, Oral, Q6H PRN **OR** acetaminophen (TYLENOL) suppository 650 mg, 650 mg, Rectal, Q6H PRN, Shela Leff, MD   carvedilol (COREG) tablet 6.25 mg, 6.25 mg, Oral, BID WC, Thurnell Lose, MD, 6.25 mg at 01/20/22 5053   chlordiazePOXIDE (LIBRIUM) capsule 10 mg, 10 mg, Oral, TID, Thurnell Lose, MD, 10 mg at 01/20/22 0918   enoxaparin (LOVENOX) injection 40 mg, 40 mg, Subcutaneous, Q24H, Shela Leff, MD, 40 mg at 97/67/34 1937   folic acid (FOLVITE) tablet 1 mg, 1 mg, Oral, Daily, Shela Leff, MD, 1 mg at 01/20/22 9024   LORazepam (ATIVAN) tablet 1-4 mg, 1-4 mg, Oral, Q1H PRN, 1 mg at 01/20/22 0448 **OR** LORazepam (ATIVAN) injection 1-4 mg, 1-4 mg, Intravenous, Q1H PRN, Shela Leff, MD, 2 mg at 01/20/22 0973   multivitamin with minerals tablet 1 tablet, 1 tablet, Oral, Daily, Shela Leff, MD, 1 tablet at 01/20/22 5329   thiamine tablet 100 mg, 100 mg, Oral, Daily, 100 mg at 01/20/22 0918 **OR** thiamine (B-1) injection 100 mg, 100 mg, Intravenous, Daily, Shela Leff, MD  Current Outpatient Medications:    acetaminophen (TYLENOL) 500 MG tablet, Take 1,000 mg by mouth every 4 (four) hours as needed for mild pain or headache., Disp: , Rfl:    alprazolam (XANAX) 2 MG tablet, Take 2 mg by mouth 3 (three) times daily., Disp: , Rfl:    busPIRone (BUSPAR) 5 MG tablet, Take 5 mg by mouth 2 (two) times daily., Disp: , Rfl:    DULoxetine (CYMBALTA) 20 MG capsule, Take 20 mg by mouth daily., Disp: , Rfl:    tamsulosin (FLOMAX) 0.4 MG CAPS  capsule, Take 1 capsule (0.4 mg total) by mouth daily after supper., Disp: 90 capsule, Rfl: 3  Exam: Current vital signs: BP (!) 153/73    Pulse 92    Temp 98.6 F (37 C) (Oral)    Resp (!) 25    Ht 5\' 8"  (1.727 m)    Wt 77.1 kg    SpO2 96%    BMI 25.85 kg/m  Vital signs in last 24 hours: Temp:  [98.2 F (36.8 C)-98.6 F (37 C)] 98.6 F (37 C) (02/15 2352) Pulse Rate:  [70-116] 92 (02/16 0915) Resp:  [17-25] 25 (02/16 0915) BP: (120-162)/(68-122) 153/73 (02/16 0915) SpO2:  [92 %-98 %] 96 % (02/16 0915) Weight:  [77.1 kg] 77.1 kg (02/15 1833)  GENERAL: Awake, alert, in no acute distress Psych: Affect appropriate for situation, patient is calm and cooperative with examination Head: Normocephalic and atraumatic, without obvious abnormality EENT: Normal conjunctivae, dry mucous membranes, no OP obstruction LUNGS: Normal respiratory effort. Non-labored breathing on room air CV: Regular rate and rhythm on telemetry ABDOMEN:  Soft, non-tender, non-distended Extremities: Right BKA, no joint tenderness or swelling  NEURO:  Mental Status: Awake, alert, and oriented to person, place, and time. Patient is a poor historian. Initially when asked why he is in the hospital, he begins to tell examiner about events from 6 years ago leading to his BKA.  Patient states that he recalls events from yesterday but does not consistently and accurately provide details of his history of presentation. He eventually admits that he does not fully recall the events leading to hospitalization.  Speech/Language: speech is intact without dysarthria.   Naming, repetition, fluency, and comprehension intact without aphasia. No neglect is noted Cranial Nerves:  II: PERRL. Visual fields full.  III, IV, VI: EOMI without gaze preference or ptosis V: Sensation is intact to light touch and symmetrical to face.  VII: Face is symmetric resting and smiling.  VIII: Hearing is intact to voice IX, X: Palate elevation is  symmetric. Phonation normal.  XI: Normal sternocleidomastoid and trapezius muscle strength XII: Tongue protrudes midline without fasciculations.   Motor: 5/5 strength is all muscle groups with right BKA.  Tone is normal. Bulk is normal.  Sensation: Intact to light touch bilaterally in all four extremities. No extinction to DSS present.  Coordination: FTN intact bilaterally with minimal tremor with intention.  DTRs: 2+ and symmetric biceps.  Gait: Deferred  Labs I have reviewed labs in epic and the results pertinent to this consultation are: CBC    Component Value Date/Time   WBC 13.8 (H) 01/20/2022 0238   RBC 4.51 01/20/2022 0238   HGB 15.3 01/20/2022 0238   HCT 45.2 01/20/2022 0238   PLT 233 01/20/2022 0238   MCV 100.2 (H) 01/20/2022 0238   MCH 33.9 01/20/2022 0238   MCHC 33.8 01/20/2022 0238   RDW 13.3 01/20/2022 0238   LYMPHSABS 1.8 12/16/2021 2017   MONOABS 0.5 12/16/2021 2017   EOSABS 0.2 12/16/2021 2017   BASOSABS 0.0 12/16/2021 2017   CMP     Component Value Date/Time   NA 134 (L) 01/19/2022 1728   K 4.9 01/19/2022 1728   CL 102 01/19/2022 1728   CO2 23 01/19/2022 1728   GLUCOSE 137 (H) 01/19/2022 1728   BUN 14 01/19/2022 1728   CREATININE 0.88 01/19/2022 1728   CREATININE 1.07 12/25/2019 1502   CALCIUM 9.1 01/19/2022 1728   PROT 7.0 01/19/2022 1728   ALBUMIN 3.7 01/19/2022 1728   AST 40 01/19/2022 1728   ALT 26 01/19/2022 1728   ALKPHOS 73 01/19/2022 1728   BILITOT 2.2 (H) 01/19/2022 1728   GFRNONAA >60 01/19/2022 1728   GFRNONAA 71 12/25/2019 1502   GFRAA >60 02/06/2020 0400   GFRAA 82 12/25/2019 1502   Lipid Panel  No results found for: CHOL, TRIG, HDL, CHOLHDL, VLDL, LDLCALC, LDLDIRECT No results found for: HGBA1C Alcohol Level    Component Value Date/Time   ETH <10 01/19/2022 1756   Imaging I have reviewed the images obtained:  CT-scan of the brain 2/15: 1. No acute intracranial pathology. 2. Mild age-related atrophy and chronic  microvascular ischemic changes.  MRI brain 2/16: 1. No evidence of acute intracranial abnormality on this mildly motion limited study. 2. Mild cerebral atrophy (ICD10-G31.9).   Assessment: 71 y.o. male who presented to the ED after syncopal event at home with concern for vagal event versus seizure. Seizure risk factors include ETOH abuse and possible alcohol withdrawal with undetectable level on arrival and Xanax withdrawal though last dose was reported 2/14. Patient without reported history  of seizures before.  - Examination reveals patient who appears slightly anxious and disoriented to situation but without seizure-like activity.  - WBC on arrival 19 with improvement to 13.8 today, likely reactive though would trend.  - Imaging is without evidence of acute intracranial abnormality.  - Presentation is concerning for possible vagal episode versus seizure. EEG pending.   Recommendations: - Routine EEG  - MRI brain pending - Seizure precautions - Will defer AED initiation at this time without further concern for seizures - Agree with CIWA protocol - Agree with high dose thiamine replacement   Pt seen by NP/Neuro and later by MD. Note/plan to be edited by MD as needed.  Anibal Henderson, AGAC-NP [facility] Pager: [phone]  ATTENDING ATTESTATION:  H/o ETOH use. Vagal episode vs seizure. Exam is benign and non focal.  Discussed that ETOH can lower seizure threshold. No driving until cleared by neurology outpt. He understands.  Dr. Reeves Forth evaluated pt independently, reviewed imaging, chart, labs. Discussed and formulated plan with the APP. Please see APP note above for details.     MDM: high complexity.     Laretta Pyatt,MD

## 2022-01-20 NOTE — Progress Notes (Signed)
Message left for EEG department to perform test at bedside. Hair piece removed.

## 2022-01-20 NOTE — Progress Notes (Signed)
EEG complete - results pending 

## 2022-01-20 NOTE — ED Notes (Signed)
Wife Mardene Celeste [phone] OR [phone] would like an update asap, she says she can't sleep until she hears about her husband

## 2022-01-20 NOTE — Progress Notes (Signed)
Not able to do EEG at this time.Tech will check later of location.

## 2022-01-20 NOTE — Progress Notes (Signed)
Patient has a glued on hair piece that is preventing techs from doing EEG. Spoke w wife, RN, and Dr. Candiss Norse about the difficulties/situation and that once hair piece is removed we will be able to proceed with the EEG.   Gave RN the EEG number [id number] so that she may call and let us know when hair has been removed.

## 2022-01-20 NOTE — Progress Notes (Signed)
PROGRESS NOTE                                                                                                                                                                                                             Patient Demographics:    Austin Dillon, is a 71 y.o. male, DOB - [birth year], MLY:[id number]  Outpatient Primary MD for the patient is Baxter Hire, MD    LOS - 0  Admit date - 01/19/2022    Chief Complaint  Patient presents with   Fall       Brief Narrative (HPI from H&P)  -  Austin Dillon is a 71 y.o. 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  EMS reported lethargy, poor appetite, and diarrhea.  He had 1 episode of vomiting in route and was given a 250 cc fluid bolus.  Wife reported to ED provider that patient was sitting on the toilet having a bowel movement when all of a sudden he lost consciousness and fell off the commode onto his left side.  Patient complained of left shoulder pain.  Wife reported that patient had 4-5 minutes of jerking with his eyes rolling back in his head.  He also apparently intermittently drinks a lot of alcohol, he had about 18 cans of beer 2 days before this episode, according to the wife for the last 2 weeks he has been drinking on a daily basis, he had a history of alcohol abuse but had quit for a long time and has recently resumed drinking.  He did not drink any alcohol yesterday.  He was admitted here for seizure-like activity and syncope work-up.   Subjective:    Austin Dillon today has, No headache, No chest pain, No abdominal pain - No Nausea, No new weakness tingling or numbness, no SOB.   Assessment  & Plan :    Acute metabolic encephalopathy with possible vasovagal  syncope versus seizure- in  setting of recent heavy alcohol use but none on the day of this episode.  CT of the head is unremarkable, he does have underlying dementia but no focal deficits, will obtain MRI and EEG and request neurology to evaluate.  Advance activity with  PT OT.  2.  Ongoing heavy alcohol abuse.  Counseled to quit, will placed on scheduled Librium along with CIWA protocol.  3.  Alzheimer's dementia.  At risk for delirium.  Minimize narcotics and benzodiazepines as much as possible, for now requiring CIWA protocol.  Monitor.  4.  Reported diarrhea prior to coming here.  None in the hospital.  GI pathogen panel ordered monitor with supportive care.  Abdominal exam is benign.  5.  Reactionary leukocytosis.  Improving monitor.  Abdominal x-ray unremarkable, chest x-ray has bilateral atelectasis will monitor for any signs of pneumonia.  Add incentive spirometer.  6.  Underlying anxiety depression.  Home dose Cymbalta.  7.  Hypertension.  Placed on Coreg will monitor.  8.  Right BKA.  Had first prosthesis.  Supportive care.      Condition - Extremely Guarded  Family Communication  :  wife [phone] 01/20/22  Code Status :  Full  Consults  :  Neuro  PUD Prophylaxis :    Procedures  :     CT - Non acute   MRI  EEG      Disposition Plan  :    Status is: Observation  DVT Prophylaxis  :    enoxaparin (LOVENOX) injection 40 mg Start: 01/20/22 1000  Lab Results  Component Value Date   PLT 233 01/20/2022    Diet :  Diet Order             Diet Heart Room service appropriate? Yes; Fluid consistency: Thin  Diet effective now                    Inpatient Medications  Scheduled Meds:  carvedilol  6.25 mg Oral BID WC   chlordiazePOXIDE  10 mg Oral TID   enoxaparin (LOVENOX) injection  40 mg Subcutaneous X54M   folic acid  1 mg Oral Daily   multivitamin with minerals  1 tablet Oral Daily   thiamine  100 mg Oral Daily   Or   thiamine  100 mg Intravenous Daily   Continuous  Infusions: PRN Meds:.acetaminophen **OR** acetaminophen, LORazepam **OR** LORazepam  Antibiotics  :    Anti-infectives (From admission, onward)    None        Time Spent in minutes  30   Lala Lund M.D on 01/20/2022 at 10:32 AM  To page go to [email or web address]   [facility] -  Office  [phone]  See all Orders from today for further details    Objective:   Vitals:   01/20/22 0815 01/20/22 0845 01/20/22 0900 01/20/22 0915  BP: (!) 153/122 (!) 148/69 (!) 160/68 (!) 153/73  Pulse: 77 87 90 92  Resp: 17 (!) 22 (!) 21 (!) 25  Temp:      TempSrc:      SpO2: 92% 96% 98% 96%  Weight:      Height:        Wt Readings from Last 3 Encounters:  01/19/22 77.1 kg  10/27/21 73.9 kg  10/13/21 76.2 kg     Intake/Output Summary (Last 24 hours) at 01/20/2022 1032 Last data filed at 01/20/2022 0402 Gross per 24 hour  Intake 1000 ml  Output --  Net 1000 ml     Physical Exam  Awake Alert x 1, No new F.N deficits,  right BKA, Austin Dillon.AT,PERRAL Supple Neck, No JVD,   Symmetrical Chest wall movement, Good air movement bilaterally, CTAB RRR,No Gallops,Rubs or new Murmurs,  +ve B.Sounds, Abd Soft, No tenderness,  No Cyanosis, Clubbing or edema      Data Review:    CBC Recent Labs  Lab 01/19/22 1440 01/20/22 0238  WBC 19.0* 13.8*  HGB 16.3 15.3  HCT 47.0 45.2  PLT 278 233  MCV 99.4 100.2*  MCH 34.5* 33.9  MCHC 34.7 33.8  RDW 13.4 13.3    Electrolytes Recent Labs  Lab 01/19/22 1440 01/19/22 1728 01/19/22 1756  NA 130* 134*  --   K 7.0* 4.9  --   CL 101 102  --   CO2 16* 23  --   GLUCOSE 240* 137*  --   BUN 15 14  --   CREATININE 0.96 0.88  --   CALCIUM 8.4* 9.1  --   AST  --  40  --   ALT  --  26  --   ALKPHOS  --  73  --   BILITOT  --  2.2*  --   ALBUMIN  --  3.7  --   AMMONIA  --   --  28    ------------------------------------------------------------------------------------------------------------------ No results for input(s): CHOL,  HDL, LDLCALC, TRIG, CHOLHDL, LDLDIRECT in the last 72 hours.  No results found for: HGBA1C  No results for input(s): TSH, T4TOTAL, T3FREE, THYROIDAB in the last 72 hours.  Invalid input(s): FREET3 ------------------------------------------------------------------------------------------------------------------ ID Labs Recent Labs  Lab 01/19/22 1440 01/19/22 1728 01/20/22 0238  WBC 19.0*  --  13.8*  PLT 278  --  233  CREATININE 0.96 0.88  --    Cardiac Enzymes No results for input(s): CKMB, TROPONINI, MYOGLOBIN in the last 168 hours.  Invalid input(s): CK   Radiology Reports DG Chest 1 View  Result Date: 01/20/2022 CLINICAL DATA:  Provided history: Shortness of breath, syncopal episode/possible seizure. EXAM: CHEST  1 VIEW COMPARISON:  Prior chest radiographs 12/17/2021 and earlier. FINDINGS: Heart size within normal limits. Mild ill-defined opacity within the bilateral lung bases. No evidence of pleural effusion or pneumothorax. No acute bony abnormality identified. IMPRESSION: Mild ill-defined opacity within the bilateral lung bases, which may reflect atelectasis. Pneumonia is difficult to exclude and clinical correlation is recommended. Electronically Signed   By: Kellie Simmering D.O.   On: 01/20/2022 09:02   DG Abdomen 1 View  Result Date: 01/19/2022 CLINICAL DATA:  Trauma EXAM: ABDOMEN - 1 VIEW COMPARISON:  CT done on 09/23/2021 FINDINGS: Bowel gas pattern is nonspecific. No abnormal masses or calcifications are seen. Kidneys are partly obscured by bowel contents. Degenerative changes are noted at lumbosacral junction. There is previous right hip arthroplasty. IMPRESSION: Nonspecific bowel gas pattern. Electronically Signed   By: Elmer Picker M.D.   On: 01/19/2022 16:51   CT Head Wo Contrast  Result Date: 01/19/2022 CLINICAL DATA:  Seizure. EXAM: CT HEAD WITHOUT CONTRAST TECHNIQUE: Contiguous axial images were obtained from the base of the skull through the vertex without  intravenous contrast. RADIATION DOSE REDUCTION: This exam was performed according to the departmental dose-optimization program which includes automated exposure control, adjustment of the mA and/or kV according to patient size and/or use of iterative reconstruction technique. COMPARISON:  Head CT dated 05/18/2018. FINDINGS: Brain: Mild age-related atrophy and chronic microvascular ischemic changes. There is no acute intracranial hemorrhage. No mass effect or midline shift. No extra-axial fluid collection. Vascular: No hyperdense vessel or unexpected calcification. Skull: Normal. Negative for fracture or focal lesion. Sinuses/Orbits: No acute finding. Other: None IMPRESSION: 1. No acute intracranial pathology. 2. Mild age-related atrophy and chronic microvascular ischemic changes. Electronically Signed   By: Milas Hock  Radparvar M.D.   On: 01/19/2022 20:11   DG Shoulder Left  Result Date: 01/19/2022 CLINICAL DATA:  Trauma EXAM: LEFT SHOULDER - 2+ VIEW COMPARISON:  07/15/2021 FINDINGS: No fracture or dislocation is seen. Degenerative changes are noted in the left [facility] joint with bony spurs. There are no abnormal soft tissue calcifications. IMPRESSION: No fracture or dislocation is seen in the left shoulder. Electronically Signed   By: Elmer Picker M.D.   On: 01/19/2022 16:48

## 2022-01-21 DIAGNOSIS — F10931 Alcohol use, unspecified with withdrawal delirium: Secondary | ICD-10-CM

## 2022-01-21 LAB — COMPREHENSIVE METABOLIC PANEL
ALT: 36 U/L (ref 0–44)
AST: 53 U/L — ABNORMAL HIGH (ref 15–41)
Albumin: 3.6 g/dL (ref 3.5–5.0)
Alkaline Phosphatase: 73 U/L (ref 38–126)
Anion gap: 12 (ref 5–15)
BUN: 18 mg/dL (ref 8–23)
CO2: 22 mmol/L (ref 22–32)
Calcium: 9.2 mg/dL (ref 8.9–10.3)
Chloride: 104 mmol/L (ref 98–111)
Creatinine, Ser: 1.02 mg/dL (ref 0.61–1.24)
GFR, Estimated: >60 mL/min (ref >60)
Glucose, Bld: 93 mg/dL (ref 70–99)
Potassium: 4 mmol/L (ref 3.5–5.1)
Sodium: 138 mmol/L (ref 135–145)
Total Bilirubin: 1.6 mg/dL — ABNORMAL HIGH (ref 0.3–1.2)
Total Protein: 6.8 g/dL (ref 6.5–8.1)

## 2022-01-21 LAB — CBC WITH DIFFERENTIAL/PLATELET
Abs Immature Granulocytes: 0.07 10*3/uL (ref 0.00–0.07)
Basophils Absolute: 0 10*3/uL (ref 0.0–0.1)
Basophils Relative: 0 %
Eosinophils Absolute: 0.1 10*3/uL (ref 0.0–0.5)
Eosinophils Relative: 1 %
HCT: 43.8 % (ref 39.0–52.0)
Hemoglobin: 15.1 g/dL (ref 13.0–17.0)
Immature Granulocytes: 1 %
Lymphocytes Relative: 15 %
Lymphs Abs: 1.9 10*3/uL (ref 0.7–4.0)
MCH: 34.6 pg — ABNORMAL HIGH (ref 26.0–34.0)
MCHC: 34.5 g/dL (ref 30.0–36.0)
MCV: 100.2 fL — ABNORMAL HIGH (ref 80.0–100.0)
Monocytes Absolute: 1 10*3/uL (ref 0.1–1.0)
Monocytes Relative: 8 %
Neutro Abs: 9.2 10*3/uL — ABNORMAL HIGH (ref 1.7–7.7)
Neutrophils Relative %: 75 %
Platelets: 227 10*3/uL (ref 150–400)
RBC: 4.37 MIL/uL (ref 4.22–5.81)
RDW: 12.8 % (ref 11.5–15.5)
WBC: 12.2 10*3/uL — ABNORMAL HIGH (ref 4.0–10.5)
nRBC: 0 % (ref 0.0–0.2)

## 2022-01-21 LAB — PROCALCITONIN: Procalcitonin: <0.1 ng/mL

## 2022-01-21 LAB — BRAIN NATRIURETIC PEPTIDE: B Natriuretic Peptide: 33.9 pg/mL (ref 0.0–100.0)

## 2022-01-21 LAB — MAGNESIUM: Magnesium: 1.9 mg/dL (ref 1.7–2.4)

## 2022-01-21 MED ORDER — HALOPERIDOL LACTATE 5 MG/ML IJ SOLN
5.0000 mg | Freq: Four times a day (QID) | INTRAMUSCULAR | Status: DC | PRN
  Administered 2022-01-21: 5 mg via INTRAVENOUS
  Filled 2022-01-21: qty 1

## 2022-01-21 MED ORDER — DEXTROSE IN LACTATED RINGERS 5 % IV SOLN
INTRAVENOUS | Status: DC
Start: 2022-01-21 — End: 2022-01-23

## 2022-01-21 MED ORDER — CHLORDIAZEPOXIDE HCL 5 MG PO CAPS
20.0000 mg | ORAL_CAPSULE | Freq: Three times a day (TID) | ORAL | Status: DC
  Administered 2022-01-21 – 2022-01-23 (×7): 20 mg via ORAL
  Filled 2022-01-21 (×7): qty 4

## 2022-01-21 MED ORDER — ENSURE ENLIVE PO LIQD
237.0000 mL | Freq: Two times a day (BID) | ORAL | Status: DC
  Administered 2022-01-22 – 2022-01-24 (×4): 237 mL via ORAL

## 2022-01-21 MED ORDER — HALOPERIDOL LACTATE 5 MG/ML IJ SOLN
5.0000 mg | Freq: Once | INTRAMUSCULAR | Status: AC
  Administered 2022-01-21: 5 mg via INTRAVENOUS

## 2022-01-21 MED ORDER — CLONIDINE HCL 0.1 MG PO TABS: 0.1000 mg | ORAL_TABLET | Freq: Four times a day (QID) | ORAL | Status: DC | PRN

## 2022-01-21 MED ORDER — HALOPERIDOL LACTATE 5 MG/ML IJ SOLN
2.0000 mg | Freq: Four times a day (QID) | INTRAMUSCULAR | Status: DC | PRN
Start: 2022-01-21 — End: 2022-01-21
  Administered 2022-01-21: 2 mg via INTRAVENOUS
  Filled 2022-01-21: qty 1

## 2022-01-21 NOTE — Significant Event (Signed)
Still agitated, despite high dose ativan and restraints, patient has managed to rip through his restraints.  Ordering 5mg  IV haldol x1.  If this doesn't work, may need precedex as next step to treatment of his alcohol withdrawal delirium.

## 2022-01-21 NOTE — Progress Notes (Signed)
OT Cancellation Note  Patient Details Name: Austin Dillon MRN: [id number] DOB: 17-Jun-1951   Cancelled Treatment:    Reason Eval/Treat Not Completed: Other (comment) (RN requesting hold. Pt requiring sedation due to agitation.) Will return as schedule allows.   [place], OTR/L Acute Rehab Pager: [phone] Office: [phone] 01/21/2022, 9:25 AM
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
## 2022-01-21 NOTE — Progress Notes (Signed)
Patient ripped out of wrist restraints. States he just wants his morning beer and he will not bother anyone. Dr Alcario Drought paged and order received for 5mg  of haldol. Rapid response RN notified and will be at bedside shortly. Wife called back and updated on overnight events. States she is okay with restraints for patient and will be at hospital later this morning.

## 2022-01-21 NOTE — Progress Notes (Addendum)
Neurology Progress Note  Brief HPI :  Austin Dillon is a 71 y.o. 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 Seizure risk factors include ETOH abuse and possible alcohol withdrawal with undetectable level on arrival and Xanax withdrawal though last dose was reported 2/14. Patient without reported history of seizures before.Neurology was consulted for possible seizure vs. Syncope.  Subjective:  Patient is laying in bed awake, extremely agitated  with his right hand in a mitten restraint.Oriented to city only but disoriented to person, place, time or year.  Exam: Vitals:   01/20/22 2127 01/21/22 0500  BP: 132/73 (!) 148/81  Pulse: 86 (!) 129  Resp: (!) 22 18  Temp: 98.5 F (36.9 C)   SpO2: 95% 97%   Gen: In bed, NAD Resp: non-labored breathing, no acute distress Abd: soft, nt Ext: Rt BKA Neuro: Mental Status: Awake, oriented to city only,confuse to person, place and time  Cranial Nerves:  II: PERRL. Visual fields full.  III, IV, VI: EOMI without gaze preference or ptosis V: Sensation is intact to light touch and symmetrical to face.  VII: Face is symmetric resting and smiling.  VIII: Hearing is intact to voice IX, X: Palate elevation is symmetric. Phonation normal.  XI: Normal sternocleidomastoid and trapezius muscle strength XII: Tongue protrudes midline without fasciculations.   Motor: Able to move all extremities without any difficulty with Right BKA. Sensation: Intact to light touch bilaterally in all four extremities. No extinction to DSS present.  Coordination : Not tested on this visit due to increase agitation/ combativeness DTRs :Not tested due to increase agitation/ combativeness Gait: Deferred  Pertinent Labs: We  reviewed labs in epic and the results  are as follows BMP Latest Ref Rng & Units 01/21/2022 01/19/2022 01/19/2022  Glucose 70 - 99 mg/dL 93 137(H) 240(H)  BUN 8 - 23 mg/dL 18 14 15   Creatinine 0.61 - 1.24 mg/dL 1.02 0.88 0.96  BUN/Creat Ratio 6 - 22 (calc) - - -  Sodium 135 - 145 mmol/L 138 134(L) 130(L)  Potassium 3.5 - 5.1 mmol/L 4.0 4.9 7.0(HH)  Chloride 98 - 111 mmol/L 104 102 101  CO2 22 - 32 mmol/L 22 23 16(L)  Calcium 8.9 - 10.3 mg/dL 9.2 9.1 8.4(L)    CBC Latest Ref Rng & Units 01/21/2022 01/20/2022 01/19/2022  WBC 4.0 - 10.5 K/uL 12.2(H) 13.8(H) 19.0(H)  Hemoglobin 13.0 - 17.0 g/dL 15.1 15.3 16.3  Hematocrit 39.0 - 52.0 % 43.8 45.2 47.0  Platelets 150 - 400 K/uL 227 233 278    BNP    Component Value Date/Time   BNP 33.9 01/21/2022 0719    Imaging Reviewed: We have reviewed the images obtained MR Brain without contrast done on 01/20/2022 : revealed no evidence of acute intracranial abnormality on this mildly motion limited study. 2. Mild cerebral atrophy   Assessment:  71 y.o. male who presented to the ED after syncopal event at home with concern for vagal event versus seizure. Seizure risk factors include ETOH abuse and possible alcohol withdrawal with undetectable level on arrival and Xanax withdrawal though last dose was reported 2/14. Patient without reported history of seizures before.  - Examination reveals patient who appears awake, increase agitation/ combativeness with right hand in mitten restraints and disoriented to situation but without seizure-like  activity.  -Patient had non focal deficits on neurologically exam - WBC on arrival 19 with improvement to 12.2 today from 13.8 on 2/16 likely reactive though would trend.  - Imaging is without evidence of acute intracranial abnormality.  - Presentation is concerning for possible vagal episode versus seizure.  EEG  done on 01/21/2022 was interpreted as normal in the waking state without seizures noted.    Impression:  Likely seizures induced by alcohol withdrawal / xanax withdrawal vs vs syncope  Recommendations: - Continue patient on CIWA Protocol, schedule Librium given his history of alcohol abuse.  - Continue on high dose of Thiamine 100mg  IV daily given history of alcohol abuse.  - Hold off on AED initiation at this time since low  concern for seizures.   - Neurology will be available as needed. Please call with questions.  Attending discussed plan with Dr. Candiss Norse.   Patient seen by NP/ MD. Note Kathyrn Lass to be edited by MD as needed    Joelyn Oms, NP-BC 01/21/22/10:49 AM  Attending Neurohospitalist Addendum Patient seen and examined with APP/Resident. Agree with the history and physical as documented above. Agree with the plan as documented, which I helped formulate. I have independently reviewed the chart, obtained history, review of systems and examined the patient.I have personally reviewed pertinent head/neck/spine imaging (CT/MRI). Non focal encephalopathic exam, likely DT. Needs DT treatment as already instituted by primary team. Plan d/w Dr Candiss Norse over the phone. Please feel free to call with any questions as needed. Neurology will be available PRN.  -- Amie Portland, MD Neurologist [facility] Pager: [phone]

## 2022-01-21 NOTE — Progress Notes (Signed)
Pt attempting to get out of bed. When nursing trying to get patient to calm down and get back in the bed, pt attempted to kick the staff. Haldol will be provided to patient. MD made aware.

## 2022-01-21 NOTE — Progress Notes (Signed)
PROGRESS NOTE                                                                                                                                                                                                             Patient Demographics:    Austin Dillon, is a 71 y.o. male, DOB - Mar 22, 1951, ACZ:[id number]  Outpatient Primary MD for the patient is Austin Hire, MD    LOS - 1  Admit date - 01/19/2022    Chief Complaint  Patient presents with   Fall       Brief Narrative (HPI from H&P)  -  Austin Dillon is a 71 y.o. 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  EMS reported lethargy, poor appetite, and diarrhea.  He had 1 episode of vomiting in route and was given a 250 cc fluid bolus.  Wife reported to ED provider that patient was sitting on the toilet having a bowel movement when all of a sudden he lost consciousness and fell off the commode onto his left side.  Patient complained of left shoulder pain.  Wife reported that patient had 4-5 minutes of jerking with his eyes rolling back in his head.  He also apparently intermittently drinks a lot of alcohol, he had about 18 cans of beer 2 days before this episode, according to the wife for the last 2 weeks he has been drinking on a daily basis, he had a history of alcohol abuse but had quit for a long time and has recently resumed drinking.  He did not drink any alcohol yesterday.  He was admitted here for seizure-like activity and syncope work-up.   Subjective:   Patient in bed extremely agitated under restraints, unable to answer questions reliably, having some hallucinations.   Assessment  & Plan :    Acute metabolic encephalopathy with possible vasovagal  syncope versus seizure - in setting of  recent heavy alcohol use but none on the day of this episode.  CT of the head and MRI brain unremarkable, EEG pending, neurology on board.  This episode could have been alcohol related seizure versus vasovagal, will defer further management to neurology.  Currently no further seizure-like episodes.  2.  Ongoing heavy alcohol abuse.  Now in full-blown DTs on 01/21/2022, continue scheduled Librium dose increased, CIWA protocol, restraints, Haldol as needed.  If worse then Precedex or phenobarb drip in ICU.  3.  Alzheimer's dementia.  At risk for delirium.  Minimize narcotics and benzodiazepines as much as possible, for now requiring CIWA protocol.  Monitor.  4.  Reported diarrhea prior to coming here.  None in the hospital.  GI pathogen panel ordered monitor with supportive care.  Abdominal exam is benign.  5.  Reactionary leukocytosis.  Improving monitor.  Abdominal x-ray unremarkable, chest x-ray has bilateral atelectasis will monitor for any signs of pneumonia.  Add incentive spirometer.  6.  Underlying anxiety depression.  Home dose Cymbalta.  7.  Hypertension.  Placed on Coreg will monitor.  8.  Right BKA.  Had first prosthesis.  Supportive care.      Condition - Extremely Guarded  Family Communication  :  wife [phone]  on 01/20/22, 01/21/22  Code Status :  Full  Consults  :  Neuro  PUD Prophylaxis :    Procedures  :     CT - Non acute   MRI  - 1. No evidence of acute intracranial abnormality on this mildly motion limited study. 2. Mild cerebral atrophy  TTE- 1. Left ventricular ejection fraction, by estimation, is 60 to 65%. The left ventricle has normal function. The left ventricle has no regional wall motion abnormalities. Left ventricular diastolic parameters are consistent with Grade I diastolic dysfunction (impaired relaxation).  2. Right ventricular systolic function is normal. The right ventricular size is normal.  3. The mitral valve is normal in structure. Trivial  mitral valve regurgitation.  4. The aortic valve is tricuspid. There is mild calcification of the aortic valve. There is mild thickening of the aortic valve. Aortic valve regurgitation is mild.  5. The inferior vena cava is normal in size with greater than 50% respiratory variability, suggesting right atrial pressure of 3 mmHg.  EEG -      Disposition Plan  :    Status is: Inp  DVT Prophylaxis  :    enoxaparin (LOVENOX) injection 40 mg Start: 01/20/22 1000  Lab Results  Component Value Date   PLT 227 01/21/2022    Diet :  Diet Order             Diet Heart Room service appropriate? Yes; Fluid consistency: Thin  Diet effective now                    Inpatient Medications  Scheduled Meds:  carvedilol  6.25 mg Oral BID WC   chlordiazePOXIDE  20 mg Oral TID   enoxaparin (LOVENOX) injection  40 mg Subcutaneous C94B   folic acid  1 mg Oral Daily   multivitamin with minerals  1 tablet Oral Daily   nicotine  21 mg Transdermal QHS   thiamine  100 mg Oral Daily   Or   thiamine  100 mg Intravenous Daily   Continuous Infusions: PRN Meds:.acetaminophen **OR** acetaminophen, cloNIDine, haloperidol lactate, LORazepam **OR** LORazepam  Antibiotics  :    Anti-infectives (From admission, onward)    None        Time Spent in minutes  30   Lala Lund M.D on 01/21/2022 at 9:39 AM  To page go to [email or web address]   [facility] -  Office  [phone]  See all Orders from today for further details    Objective:   Vitals:  01/20/22 1633 01/20/22 2100 01/20/22 2127 01/21/22 0500  BP: 129/72  132/73 (!) 148/81  Pulse:  87 86 (!) 129  Resp:   (!) 22 18  Temp: 98.9 F (37.2 C)  98.5 F (36.9 C)   TempSrc:   Oral   SpO2:   95% 97%  Weight:      Height:        Wt Readings from Last 3 Encounters:  01/19/22 77.1 kg  10/27/21 73.9 kg  10/13/21 76.2 kg     Intake/Output Summary (Last 24 hours) at 01/21/2022 0939 Last data filed at 01/21/2022 0813 Gross  per 24 hour  Intake 300 ml  Output 600 ml  Net -300 ml     Physical Exam  Awake but extremely agitated and combative in restraints, moving all 4 extremities by self, right BKA  [place].AT,PERRAL Supple Neck, No JVD,   Symmetrical Chest wall movement, Good air movement bilaterally, CTAB RRR,No Gallops, Rubs or new Murmurs,  +ve B.Sounds, Abd Soft, No tenderness,   No Cyanosis, Clubbing or edema      Data Review:    CBC Recent Labs  Lab 01/19/22 1440 01/20/22 0238 01/21/22 0717  WBC 19.0* 13.8* 12.2*  HGB 16.3 15.3 15.1  HCT 47.0 45.2 43.8  PLT 278 233 227  MCV 99.4 100.2* 100.2*  MCH 34.5* 33.9 34.6*  MCHC 34.7 33.8 34.5  RDW 13.4 13.3 12.8  LYMPHSABS  --   --  1.9  MONOABS  --   --  1.0  EOSABS  --   --  0.1  BASOSABS  --   --  0.0    Electrolytes Recent Labs  Lab 01/19/22 1440 01/19/22 1728 01/19/22 1756 01/20/22 0238 01/21/22 0717 01/21/22 0719  NA 130* 134*  --   --  138  --   K 7.0* 4.9  --   --  4.0  --   CL 101 102  --   --  104  --   CO2 16* 23  --   --  22  --   GLUCOSE 240* 137*  --   --  93  --   BUN 15 14  --   --  18  --   CREATININE 0.96 0.88  --   --  1.02  --   CALCIUM 8.4* 9.1  --   --  9.2  --   AST  --  40  --   --  53*  --   ALT  --  26  --   --  36  --   ALKPHOS  --  73  --   --  73  --   BILITOT  --  2.2*  --   --  1.6*  --   ALBUMIN  --  3.7  --   --  3.6  --   MG  --   --   --   --  1.9  --   PROCALCITON  --   --   --  0.12 <0.10  --   AMMONIA  --   --  28  --   --   --   BNP  --   --   --   --   --  33.9    ------------------------------------------------------------------------------------------------------------------ No results for input(s): CHOL, HDL, LDLCALC, TRIG, CHOLHDL, LDLDIRECT in the last 72 hours.  No results found for: HGBA1C  No results for input(s): TSH, T4TOTAL, T3FREE, THYROIDAB in the last 72 hours.  Invalid input(s):  FREET3 ------------------------------------------------------------------------------------------------------------------ ID Labs Recent Labs  Lab 01/19/22 1440 01/19/22 1728 01/20/22 0238 01/21/22 0717  WBC 19.0*  --  13.8* 12.2*  PLT 278  --  233 227  PROCALCITON  --   --  0.12 <0.10  CREATININE 0.96 0.88  --  1.02   Cardiac Enzymes No results for input(s): CKMB, TROPONINI, MYOGLOBIN in the last 168 hours.  Invalid input(s): CK   Radiology Reports DG Chest 1 View  Result Date: 01/20/2022 CLINICAL DATA:  Provided history: Shortness of breath, syncopal episode/possible seizure. EXAM: CHEST  1 VIEW COMPARISON:  Prior chest radiographs 12/17/2021 and earlier. FINDINGS: Heart size within normal limits. Mild ill-defined opacity within the bilateral lung bases. No evidence of pleural effusion or pneumothorax. No acute bony abnormality identified. IMPRESSION: Mild ill-defined opacity within the bilateral lung bases, which may reflect atelectasis. Pneumonia is difficult to exclude and clinical correlation is recommended. Electronically Signed   By: Kellie Simmering D.O.   On: 01/20/2022 09:02   DG Abdomen 1 View  Result Date: 01/19/2022 CLINICAL DATA:  Trauma EXAM: ABDOMEN - 1 VIEW COMPARISON:  CT done on 09/23/2021 FINDINGS: Bowel gas pattern is nonspecific. No abnormal masses or calcifications are seen. Kidneys are partly obscured by bowel contents. Degenerative changes are noted at lumbosacral junction. There is previous right hip arthroplasty. IMPRESSION: Nonspecific bowel gas pattern. Electronically Signed   By: Elmer Picker M.D.   On: 01/19/2022 16:51   CT Head Wo Contrast  Result Date: 01/19/2022 CLINICAL DATA:  Seizure. EXAM: CT HEAD WITHOUT CONTRAST TECHNIQUE: Contiguous axial images were obtained from the base of the skull through the vertex without intravenous contrast. RADIATION DOSE REDUCTION: This exam was performed according to the departmental dose-optimization program  which includes automated exposure control, adjustment of the mA and/or kV according to patient size and/or use of iterative reconstruction technique. COMPARISON:  Head CT dated 05/18/2018. FINDINGS: Brain: Mild age-related atrophy and chronic microvascular ischemic changes. There is no acute intracranial hemorrhage. No mass effect or midline shift. No extra-axial fluid collection. Vascular: No hyperdense vessel or unexpected calcification. Skull: Normal. Negative for fracture or focal lesion. Sinuses/Orbits: No acute finding. Other: None IMPRESSION: 1. No acute intracranial pathology. 2. Mild age-related atrophy and chronic microvascular ischemic changes. Electronically Signed   By: Anner Crete M.D.   On: 01/19/2022 20:11   MR BRAIN WO CONTRAST  Result Date: 01/20/2022 CLINICAL DATA:  Seizure disorder, clinical change EXAM: MRI HEAD WITHOUT CONTRAST TECHNIQUE: Multiplanar, multiecho pulse sequences of the brain and surrounding structures were obtained without intravenous contrast. COMPARISON:  CT head January 19, 2022.  MRI 12/21/2017. FINDINGS: Brain: No acute infarction, hemorrhage, hydrocephalus, extra-axial collection or mass lesion. Mild scattered T2/FLAIR hyperintensities in the white matter, nonspecific but compatible with chronic microvascular ischemic disease that is very mild for patient age. Mild atrophy. Vascular: Major arterial flow voids are maintained skull base. Skull and upper cervical spine: Normal marrow signal. Sinuses/Orbits: Negative. Other: No mastoid effusions IMPRESSION: 1. No evidence of acute intracranial abnormality on this mildly motion limited study. 2. Mild cerebral atrophy (ICD10-G31.9). Electronically Signed   By: Margaretha Sheffield M.D.   On: 01/20/2022 10:39   DG Shoulder Left  Result Date: 01/19/2022 CLINICAL DATA:  Trauma EXAM: LEFT SHOULDER - 2+ VIEW COMPARISON:  07/15/2021 FINDINGS: No fracture or dislocation is seen. Degenerative changes are noted in the left [facility]  joint with bony spurs. There are no abnormal soft tissue calcifications. IMPRESSION: No fracture or dislocation is seen  in the left shoulder. Electronically Signed   By: Elmer Picker M.D.   On: 01/19/2022 16:48   ECHOCARDIOGRAM COMPLETE  Result Date: 01/20/2022    ECHOCARDIOGRAM REPORT   Patient Name:   Austin Dillon Date of Exam: 01/20/2022 Medical Rec #:  [id number]       Height:       68.0 in Accession #:    [id number]      Weight:       170.0 lb Date of Birth:  July 01, 1951      BSA:          1.907 m Patient Age:    61 years        BP:           149/76 mmHg Patient Gender: M               HR:           75 bpm. Exam Location:  Inpatient Procedure: 2D Echo, Cardiac Doppler and Color Doppler Indications:    Syncope  History:        Patient has no prior history of Echocardiogram examinations.  Sonographer:    Jyl Heinz Referring Phys: [id number] [place]  1. Left ventricular ejection fraction, by estimation, is 60 to 65%. The left ventricle has normal function. The left ventricle has no regional wall motion abnormalities. Left ventricular diastolic parameters are consistent with Grade I diastolic dysfunction (impaired relaxation).  2. Right ventricular systolic function is normal. The right ventricular size is normal.  3. The mitral valve is normal in structure. Trivial mitral valve regurgitation.  4. The aortic valve is tricuspid. There is mild calcification of the aortic valve. There is mild thickening of the aortic valve. Aortic valve regurgitation is mild.  5. The inferior vena cava is normal in size with greater than 50% respiratory variability, suggesting right atrial pressure of 3 mmHg. Comparison(s): No prior Echocardiogram. FINDINGS  Left Ventricle: Left ventricular ejection fraction, by estimation, is 60 to 65%. The left ventricle has normal function. The left ventricle has no regional wall motion abnormalities. The left ventricular internal cavity size was normal in size.  There is  no left ventricular hypertrophy. Left ventricular diastolic parameters are consistent with Grade I diastolic dysfunction (impaired relaxation). Right Ventricle: The right ventricular size is normal. No increase in right ventricular wall thickness. Right ventricular systolic function is normal. Left Atrium: Left atrial size was normal in size. Right Atrium: Right atrial size was normal in size. Pericardium: There is no evidence of pericardial effusion. Mitral Valve: The mitral valve is normal in structure. Mild mitral annular calcification. Trivial mitral valve regurgitation. Tricuspid Valve: The tricuspid valve is normal in structure. Tricuspid valve regurgitation is trivial. Aortic Valve: The aortic valve is tricuspid. There is mild calcification of the aortic valve. There is mild thickening of the aortic valve. Aortic valve regurgitation is mild. Aortic regurgitation PHT measures 595 msec. Aortic valve peak gradient measures 9.4 mmHg. Pulmonic Valve: The pulmonic valve was normal in structure. Pulmonic valve regurgitation is trivial. Aorta: The aortic root and ascending aorta are structurally normal, with no evidence of dilitation. Venous: The inferior vena cava is normal in size with greater than 50% respiratory variability, suggesting right atrial pressure of 3 mmHg. IAS/Shunts: The atrial septum is grossly normal.  LEFT VENTRICLE PLAX 2D LVIDd:         4.30 cm      Diastology LVIDs:  3.00 cm      LV e' medial:    7.46 cm/s LV PW:         1.00 cm      LV E/e' medial:  8.0 LV IVS:        1.10 cm      LV e' lateral:   9.17 cm/s LVOT diam:     2.00 cm      LV E/e' lateral: 6.5 LV SV:         90 LV SV Index:   47 LVOT Area:     3.14 cm  LV Volumes (MOD) LV vol d, MOD A2C: 92.5 ml LV vol d, MOD A4C: 103.0 ml LV vol s, MOD A2C: 36.0 ml LV vol s, MOD A4C: 37.2 ml LV SV MOD A2C:     56.5 ml LV SV MOD A4C:     103.0 ml LV SV MOD BP:      64.8 ml RIGHT VENTRICLE             IVC RV Basal diam:  3.10 cm      IVC diam: 1.70 cm RV Mid diam:    2.70 cm RV S prime:     11.80 cm/s TAPSE (M-mode): 2.2 cm LEFT ATRIUM             Index        RIGHT ATRIUM           Index LA diam:        3.70 cm 1.94 cm/m   RA Area:     12.90 cm LA Vol (A2C):   40.6 ml 21.29 ml/m  RA Volume:   28.60 ml  14.99 ml/m LA Vol (A4C):   39.2 ml 20.55 ml/m LA Biplane Vol: 40.0 ml 20.97 ml/m  AORTIC VALVE AV Area (Vmax): 2.75 cm AV Vmax:        153.00 cm/s AV Peak Grad:   9.4 mmHg LVOT Vmax:      134.00 cm/s LVOT Vmean:     107.000 cm/s LVOT VTI:       0.285 m AI PHT:         595 msec  AORTA Ao Root diam: 3.30 cm Ao Asc diam:  3.20 cm MITRAL VALVE MV Area (PHT): 2.75 cm    SHUNTS MV Decel Time: 276 msec    Systemic VTI:  0.29 m MV E velocity: 59.60 cm/s  Systemic Diam: 2.00 cm MV A velocity: 67.30 cm/s MV E/A ratio:  0.89 Gwyndolyn Kaufman MD Electronically signed by Gwyndolyn Kaufman MD Signature Date/Time: 01/20/2022/1:12:27 PM    Final

## 2022-01-21 NOTE — Progress Notes (Addendum)
Patient continues to hallucinate and trying to get out of bed. He is not easily redirected. Keeps requesting cigarettes. Nicotine patch placed. Patient has received prn ativan for CIWA with no improvement. Continues to be agitated and non compliant. Will not keep telemetry leads or clothes on. Pulled out ivs x2 Dr Alcario Drought paged and orders received for non violent restraints. Restraints initiated. Charge nurse aware. Unable to reach wife to notify of restraints initiation. Will continue to monitor.

## 2022-01-21 NOTE — Procedures (Signed)
History: 71 yo M being evaluated for syncope  Sedation: none  Technique: This EEG was acquired with electrodes placed according to the International 10-20 electrode system (including Fp1, Fp2, F3, F4, C3, C4, P3, P4, O1, O2, T3, T4, T5, T6, A1, A2, Fz, Cz, Pz). The following electrodes were missing or displaced: none.   Background: The background consists of intermixed alpha and beta activities. There is a well defined posterior dominant rhythm of 9 Hz that attenuates with eye opening. Sleep is not recorded.   Photic stimulation: Physiologic driving is not performed  EEG Abnormalities: none  Clinical Interpretation: This normal EEG is recorded in the waking state. There was no seizure or seizure predisposition recorded on this study. Please note that lack of epileptiform activity on EEG does not preclude the possibility of epilepsy.   Roland Rack, MD [facility] [phone]  If 7pm- 7am, please page neurology on call as listed in [place].

## 2022-01-21 NOTE — TOC Progression Note (Signed)
Transition of Care [facility]) - Progression Note    Patient Details  Name: Austin Dillon MRN: [id number] Date of Birth: Mar 14, 1951  Transition of Care [facility]) CM/SW Contact  Zenon Mayo, RN Phone Number: 01/21/2022, 4:56 PM  Clinical Narrative:    from home with wife, syncope hx of alzheimer's, restraints, has sitter, EEG yesterday. On CIWA. TOC will continue to follow for dc needs.        Expected Discharge Plan and Services                                                 Social Determinants of Health (SDOH) Interventions    Readmission Risk Interventions No flowsheet data found.

## 2022-01-21 NOTE — Progress Notes (Signed)
PT Cancellation Note  Patient Details Name: Austin Dillon MRN: [id number] DOB: 1951/03/26   Cancelled Treatment:    Reason Eval/Treat Not Completed: Other (comment) Pt with continued aggression towards staff this morning requiring sedation. RN asked PT to hold on evaluation until pt more appropriate. Will continue to follow and attempt evaluation tomorrow.   West Carbo, PT, DPT   Acute Rehabilitation Department Pager #: [phone]   Sandra Cockayne 01/21/2022, 9:20 AM

## 2022-01-21 NOTE — Progress Notes (Signed)
Initial Nutrition Assessment  DOCUMENTATION CODES:   Not applicable  INTERVENTION:   Multivitamin w/ minerals daily Ensure Enlive po BID, each supplement provides 350 kcal and 20 grams of protein.  NUTRITION DIAGNOSIS:   Inadequate oral intake related to lethargy/confusion as evidenced by meal completion < 50%.  GOAL:   Patient will meet greater than or equal to 90% of their needs  MONITOR:   PO intake, Supplement acceptance, Weight trends  REASON FOR ASSESSMENT:   Malnutrition Screening Tool    ASSESSMENT:   71 y.o. male presented to the ED after a syncopal episode. PMH includes Alzheimer's disease, R BKA, and EtOH abuse. Pt admitted with EtOH withdrawal, syncope, and diarrhea.   Sitter at bedside, pt sleeping. Per sitter pt just recently received a dose of Haldol due to agitation. Told sitter that we will start Ensure incase pt is not awake enough to eat as much.  Per EMR & sitter, pt ate 50% of his breakfast this morning.  Suspect that current weight within chart is stated versus actual.   Unable to obtain nutrition history, wife not at bedside. Per H&P, pt drinks 18 beers/day.   Medications reviewed and include: Folic Acid, MVI, Thiamine Labs reviewed.   NUTRITION - FOCUSED PHYSICAL EXAM:  Deferred to follow-up   Diet Order:   Diet Order             Diet Heart Room service appropriate? Yes; Fluid consistency: Thin  Diet effective now                   EDUCATION NEEDS:   Not appropriate for education at this time  Skin:  Skin Assessment: Reviewed RN Assessment  Last BM:  2/15  Height:   Ht Readings from Last 1 Encounters:  01/20/22 5\' 8"  (1.727 m)    Weight:   Wt Readings from Last 1 Encounters:  01/19/22 77.1 kg    Ideal Body Weight:  70 kg  BMI:  Body mass index is 25.85 kg/m.  Estimated Nutritional Needs:   Kcal:  2000-2200  Protein:  100-115 grams  Fluid:  >/= 2 L    Daneshia Tavano Louie Casa, RD, LDN Clinical  Dietitian See [facility] for contact information.

## 2022-01-22 DIAGNOSIS — F418 Other specified anxiety disorders: Secondary | ICD-10-CM

## 2022-01-22 LAB — CBC WITH DIFFERENTIAL/PLATELET
Abs Immature Granulocytes: 0.03 10*3/uL (ref 0.00–0.07)
Basophils Absolute: 0 10*3/uL (ref 0.0–0.1)
Basophils Relative: 0 %
Eosinophils Absolute: 0.2 10*3/uL (ref 0.0–0.5)
Eosinophils Relative: 3 %
HCT: 40.6 % (ref 39.0–52.0)
Hemoglobin: 13.9 g/dL (ref 13.0–17.0)
Immature Granulocytes: 0 %
Lymphocytes Relative: 32 %
Lymphs Abs: 2.1 10*3/uL (ref 0.7–4.0)
MCH: 34.2 pg — ABNORMAL HIGH (ref 26.0–34.0)
MCHC: 34.2 g/dL (ref 30.0–36.0)
MCV: 100 fL (ref 80.0–100.0)
Monocytes Absolute: 0.6 10*3/uL (ref 0.1–1.0)
Monocytes Relative: 9 %
Neutro Abs: 3.7 10*3/uL (ref 1.7–7.7)
Neutrophils Relative %: 56 %
Platelets: 206 10*3/uL (ref 150–400)
RBC: 4.06 MIL/uL — ABNORMAL LOW (ref 4.22–5.81)
RDW: 12.7 % (ref 11.5–15.5)
WBC: 6.7 10*3/uL (ref 4.0–10.5)
nRBC: 0 % (ref 0.0–0.2)

## 2022-01-22 LAB — COMPREHENSIVE METABOLIC PANEL
ALT: 32 U/L (ref 0–44)
AST: 40 U/L (ref 15–41)
Albumin: 3 g/dL — ABNORMAL LOW (ref 3.5–5.0)
Alkaline Phosphatase: 64 U/L (ref 38–126)
Anion gap: 8 (ref 5–15)
BUN: 17 mg/dL (ref 8–23)
CO2: 27 mmol/L (ref 22–32)
Calcium: 8.7 mg/dL — ABNORMAL LOW (ref 8.9–10.3)
Chloride: 104 mmol/L (ref 98–111)
Creatinine, Ser: 0.87 mg/dL (ref 0.61–1.24)
GFR, Estimated: >60 mL/min (ref >60)
Glucose, Bld: 111 mg/dL — ABNORMAL HIGH (ref 70–99)
Potassium: 3.6 mmol/L (ref 3.5–5.1)
Sodium: 139 mmol/L (ref 135–145)
Total Bilirubin: 0.8 mg/dL (ref 0.3–1.2)
Total Protein: 5.9 g/dL — ABNORMAL LOW (ref 6.5–8.1)

## 2022-01-22 LAB — BRAIN NATRIURETIC PEPTIDE: B Natriuretic Peptide: 19.1 pg/mL (ref 0.0–100.0)

## 2022-01-22 LAB — MAGNESIUM: Magnesium: 2.2 mg/dL (ref 1.7–2.4)

## 2022-01-22 LAB — PROCALCITONIN: Procalcitonin: <0.1 ng/mL

## 2022-01-22 NOTE — Evaluation (Signed)
Physical Therapy Evaluation Patient Details Name: Austin Dillon MRN: [id number] DOB: Dec 18, 1950 Today's Date: 01/22/2022  History of Present Illness  The pt is a 71 yo male presenting 2/15 after a syncopal episode at home. Upon work up, pt with acute metabolic encephalopathy with possible vasovagal syncope. PMH includes: alzheimer's dementia, R BKA, anxiety, depression, PTSD, and alcohol abuse.   Clinical Impression  Pt in bed upon arrival of PT, agreeable to evaluation at this time. Prior to admission the pt was mobilizing with use of RW in the home, no need for supervision or assist from wife for any mobility or ADLs. The pt reports no recent falls (despite being admitted for a fall). The pt was able to complete sit-stand transfers without need for assist at this time, and completed ~150 ft hallway ambulation with RW and no overt LOB. He does ambulate with slowed speed, but demos good stability and safety awareness with gait. The pt was able to follow simple cues but does not show good situational awareness and memory deficits (baseline). Wife present for session and in agreement with plan. Pt will benefit from skilled PT to maintain mobility and dynamic stability to reduce risk of falls.   Gait Speed: 0.77m/s using RW (Gait speed <0.54m/s indicates increased risk of falls and dependence in ADLs)       Recommendations for follow up therapy are one component of a multi-disciplinary discharge planning process, led by the attending physician.  Recommendations may be updated based on patient status, additional functional criteria and insurance authorization.  Follow Up Recommendations Long-term institutional care without follow-up therapy    Assistance Recommended at Discharge Frequent or constant Supervision/Assistance  Patient can return home with the following  Direct supervision/assist for medications management;Direct supervision/assist for financial management;Help with stairs or ramp for  entrance    Equipment Recommendations None recommended by PT  Recommendations for Other Services       Functional Status Assessment Patient has had a recent decline in their functional status and demonstrates the ability to make significant improvements in function in a reasonable and predictable amount of time.     Precautions / Restrictions Precautions Precautions: Fall Precaution Comments: R prosthetic leg Restrictions Weight Bearing Restrictions: No      Mobility  Bed Mobility               General bed mobility comments: pt OOB in recliner at start and end of session    Transfers Overall transfer level: Needs assistance Equipment used: Rolling walker (2 wheels) Transfers: Sit to/from Stand Sit to Stand: Min guard           General transfer comment: minG for safety, no assist given    Ambulation/Gait Ambulation/Gait assistance: Min guard Gait Distance (Feet): 150 Feet Assistive device: Rolling walker (2 wheels) Gait Pattern/deviations: Step-through pattern Gait velocity: 0.45 m/s Gait velocity interpretation: <1.31 ft/sec, indicative of household ambulator   General Gait Details: slightly antalgic as pt states he needs to add a sock to R prosthetic. The pt had no overt LOB, states he feels close to his baseline mobility,     Balance Overall balance assessment: Mild deficits observed, not formally tested                                           Pertinent Vitals/Pain Pain Assessment Pain Assessment: No/denies pain    Home Living Family/patient  expects to be discharged to:: Private residence Living Arrangements: Spouse/significant other Available Help at Discharge: Family;Available 24 hours/day Type of Home: House Home Access: Ramped entrance       Home Layout: One level;Laundry or work area in [place]: Conservation officer, nature (2 wheels);Crutches Additional Comments: per wife: plan to d/c to [facility] as she can no  longer take care of him and he is violent. The pt does not know this plan    Prior Function Prior Level of Function : Independent/Modified Independent;Patient poor historian/Family not available             Mobility Comments: pt mobilizing with RW, no assist from wife for mobility       Hand Dominance   Dominant Hand: Right    Extremity/Trunk Assessment   Upper Extremity Assessment Upper Extremity Assessment: Overall WFL for tasks assessed    Lower Extremity Assessment Lower Extremity Assessment: Overall WFL for tasks assessed;RLE deficits/detail RLE Deficits / Details: chronic R BKA    Cervical / Trunk Assessment Cervical / Trunk Assessment: Normal  Communication   Communication: No difficulties  Cognition Arousal/Alertness: Awake/alert Behavior During Therapy: WFL for tasks assessed/performed Overall Cognitive Status: History of cognitive impairments - at baseline                                 General Comments: pt with hx of dementia, pleasant today but no recall of events from yesterday        General Comments General comments (skin integrity, edema, etc.): BP stable, mildly elevated after activity. other VSS    Exercises     Assessment/Plan    PT Assessment Patient needs continued PT services  PT Problem List Decreased balance;Decreased mobility;Decreased activity tolerance;Decreased cognition       PT Treatment Interventions DME instruction;Gait training;Stair training;Functional mobility training;Therapeutic activities;Therapeutic exercise;Balance training;Cognitive remediation;Patient/family education    PT Goals (Current goals can be found in the Care Plan section)  Acute Rehab PT Goals Patient Stated Goal: to maintain independence with gait PT Goal Formulation: With patient Time For Goal Achievement: 02/05/22 Potential to Achieve Goals: Good    Frequency Min 2X/week        AM-PAC PT "6 Clicks" Mobility  Outcome Measure  Help needed turning from your back to your side while in a flat bed without using bedrails?: None Help needed moving from lying on your back to sitting on the side of a flat bed without using bedrails?: None Help needed moving to and from a bed to a chair (including a wheelchair)?: None Help needed standing up from a chair using your arms (e.g., wheelchair or bedside chair)?: A Little Help needed to walk in hospital room?: A Little Help needed climbing 3-5 steps with a railing? : A Little 6 Click Score: 21    End of Session Equipment Utilized During Treatment: Gait belt Activity Tolerance: Patient tolerated treatment well Patient left: in chair;with call bell/phone within reach;with family/visitor present;with nursing/sitter in room Nurse Communication: Mobility status PT Visit Diagnosis: Other abnormalities of gait and mobility (R26.89)    Time: 6644-0347 PT Time Calculation (min) (ACUTE ONLY): 24 min   Charges:   PT Evaluation $PT Eval Low Complexity: 1 Low PT Treatments $Therapeutic Exercise: 8-22 mins        West Carbo, PT, DPT   Acute Rehabilitation Department Pager #: [phone]  Sandra Cockayne 01/22/2022, 5:06 PM

## 2022-01-22 NOTE — Evaluation (Signed)
Occupational Therapy Evaluation Patient Details Name: Austin Dillon MRN: [id number] DOB: 1951-11-03 Today's Date: 01/22/2022   History of Present Illness 71 y.o. 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   Clinical Impression   Patient admitted for the above diagnosis.  PTA he lives at home with his spouse, who appears to be looking at [place] [facility] the [place] in [place].  Patient initially agitated upon admission, but seems to be back to baseline.  Very cooperative, and is probably close to his baseline with respect to ADL and in room mobility/toileting with R prosthetic and RW.  OT can follow in the acute setting, but as mentioned, given cognition, he is close to baseline.        Recommendations for follow up therapy are one component of a multi-disciplinary discharge planning process, led by the attending physician.  Recommendations may be updated based on patient status, additional functional criteria and insurance authorization.   Follow Up Recommendations  Other (comment) (Spouse is looking for LTC [facility] the [facility].)    Assistance Recommended at Discharge Intermittent Supervision/Assistance  Patient can return home with the following      Functional Status Assessment  Patient has had a recent decline in their functional status and demonstrates the ability to make significant improvements in function in a reasonable and predictable amount of time.  Equipment Recommendations  None recommended by OT    Recommendations for Other Services       Precautions / Restrictions Precautions Precautions: Fall Precaution Comments: R prosthetic leg Restrictions Weight Bearing Restrictions: No      Mobility Bed Mobility Overal bed mobility: Needs Assistance Bed Mobility: Supine to Sit     Supine to sit:  Supervision     General bed mobility comments: lines and leads    Transfers Overall transfer level: Needs assistance Equipment used: Rolling walker (2 wheels) Transfers: Sit to/from Stand, Bed to chair/wheelchair/BSC Sit to Stand: Supervision     Step pivot transfers: Supervision            Balance Overall balance assessment: Mild deficits observed, not formally tested                                         ADL either performed or assessed with clinical judgement   ADL                                         General ADL Comments: Generalized supervision for ADL sit/stand, mobility to the toilet, and stand grooming.     Vision Patient Visual Report: No change from baseline       Perception Perception Perception: Not tested   Praxis Praxis Praxis: Not tested    Pertinent Vitals/Pain Pain Assessment Pain Assessment: No/denies pain     Hand Dominance Right   Extremity/Trunk Assessment Upper Extremity Assessment Upper Extremity Assessment: Overall WFL for tasks assessed   Lower Extremity Assessment Lower Extremity Assessment: Defer to PT evaluation   Cervical / Trunk Assessment Cervical / Trunk Assessment: Normal   Communication Communication Communication: No difficulties   Cognition Arousal/Alertness: Awake/alert Behavior During Therapy: WFL for tasks assessed/performed Overall Cognitive Status: History  of cognitive impairments - at baseline                                       General Comments   HR to 101 with mobility.    Exercises     Shoulder Instructions      Home Living Family/patient expects to be discharged to:: Private residence Living Arrangements: Spouse/significant other Available Help at Discharge: Family;Available 24 hours/day Type of Home: House Home Access: Ramped entrance     Home Layout: One level;Laundry or work area in basement     [employer] Shower/Tub: Environmental education officer Accessibility: Yes How Accessible: Accessible via walker Home Equipment: Conservation officer, nature (2 wheels);Crutches          Prior Functioning/Environment Prior Level of Function : Independent/Modified Independent;Patient poor historian/Family not available                        OT Problem List: Impaired balance (sitting and/or standing)      OT Treatment/Interventions: Self-care/ADL training;Therapeutic activities;Balance training    OT Goals(Current goals can be found in the care plan section) Acute Rehab OT Goals Patient Stated Goal: Go home OT Goal Formulation: With patient Time For Goal Achievement: 02/04/22 Potential to Achieve Goals: Good ADL Goals Pt Will Perform Grooming: with modified independence;standing Pt Will Perform Lower Body Bathing: with modified independence;sit to/from stand Pt Will Perform Lower Body Dressing: with modified independence;sit to/from stand Pt Will Transfer to Toilet: with modified independence;ambulating;regular height toilet  OT Frequency: Min 2X/week    Co-evaluation              AM-PAC OT "6 Clicks" Daily Activity     Outcome Measure Help from another person eating meals?: None Help from another person taking care of personal grooming?: A Little Help from another person toileting, which includes using toliet, bedpan, or urinal?: A Little Help from another person bathing (including washing, rinsing, drying)?: A Little Help from another person to put on and taking off regular upper body clothing?: None Help from another person to put on and taking off regular lower body clothing?: A Little 6 Click Score: 20   End of Session Equipment Utilized During Treatment: Rolling walker (2 wheels) Nurse Communication: Mobility status  Activity Tolerance: Patient tolerated treatment well Patient left: in chair;with call bell/phone within reach;with nursing/sitter in room  OT Visit Diagnosis: Unsteadiness on feet  (R26.81)                Time: 4562-5638 OT Time Calculation (min): 24 min Charges:  OT General Charges $OT Visit: 1 Visit OT Evaluation $OT Eval Moderate Complexity: 1 Mod OT Treatments $Self Care/Home Management : 8-22 mins  01/22/2022  RP, OTR/L  Acute Rehabilitation Services  Office:  [phone]   Metta Clines 01/22/2022, 10:04 AM

## 2022-01-22 NOTE — Progress Notes (Signed)
PROGRESS NOTE                                                                                                                                                                                                             Patient Demographics:    Austin Dillon, is a 71 y.o. male, DOB - 1951-11-07, RJJ:[id number]  Outpatient Primary MD for the patient is Baxter Hire, MD    LOS - 2  Admit date - 01/19/2022    Chief Complaint  Patient presents with   Fall       Brief Narrative (HPI from H&P)  -  Austin Dillon is a 71 y.o. 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  EMS reported lethargy, poor appetite, and diarrhea.  He had 1 episode of vomiting in route and was given a 250 cc fluid bolus.  Wife reported to ED provider that patient was sitting on the toilet having a bowel movement when all of a sudden he lost consciousness and fell off the commode onto his left side.  Patient complained of left shoulder pain.  Wife reported that patient had 4-5 minutes of jerking with his eyes rolling back in his head.  He also apparently intermittently drinks a lot of alcohol, he had about 18 cans of beer 2 days before this episode, according to the wife for the last 2 weeks he has been drinking on a daily basis, he had a history of alcohol abuse but had quit for a long time and has recently resumed drinking.  He did not drink any alcohol yesterday.  He was admitted here for seizure-like activity and syncope work-up.   Subjective:   Patient in bed, appears comfortable, denies any headache, no fever, no chest pain or pressure, no shortness of breath , no abdominal pain. No new focal weakness.    Assessment  & Plan :    Acute metabolic encephalopathy with possible  vasovagal  syncope versus seizure - in setting of recent heavy alcohol use but none on the day of this episode.  CT of the head and MRI brain unremarkable, EEG pending, neurology on board.  This episode could have been alcohol related seizure versus vasovagal,  will defer further management to neurology.  Currently no further seizure-like episodes.  2.  Ongoing heavy alcohol abuse.  Went into full-blown DTs on 01/21/2022, much improved on 01/22/2022 after being placed on scheduled high-dose Librium, CIWA protocol , PRN Haldol and IV fluids, continue to monitor counseled to quit alcohol.  3.  Alzheimer's dementia.  At risk for delirium.  Minimize narcotics and benzodiazepines as much as possible, for now requiring CIWA protocol.  Monitor.  4.  Reported diarrhea prior to coming here.  None in the hospital.  GI pathogen panel ordered monitor with supportive care.  Abdominal exam is benign.  5.  Reactionary leukocytosis.  Improving monitor.  Abdominal x-ray unremarkable, chest x-ray has bilateral atelectasis will monitor for any signs of pneumonia.  Add incentive spirometer.  6.  Underlying anxiety depression.  Home dose Cymbalta.  7.  Hypertension.  Placed on Coreg + PRN Catapres will monitor.  8.  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  Supportive care.       Condition - Extremely Guarded  Family Communication  :  wife [phone]  on 01/20/22, 01/21/22  Code Status :  Full  Consults  :  Neuro  PUD Prophylaxis :    Procedures  :     CT - Non acute   MRI  - 1. No evidence of acute intracranial abnormality on this mildly motion limited study. 2. Mild cerebral atrophy  TTE- 1. Left ventricular ejection fraction, by estimation, is 60 to 65%. The left ventricle has normal function. The left ventricle has no regional wall motion abnormalities. Left ventricular diastolic parameters are consistent with Grade I diastolic dysfunction (impaired relaxation).  2. Right ventricular systolic function is normal.  The right ventricular size is normal.  3. The mitral valve is normal in structure. Trivial mitral valve regurgitation.  4. The aortic valve is tricuspid. There is mild calcification of the aortic valve. There is mild thickening of the aortic valve. Aortic valve regurgitation is mild.  5. The inferior vena cava is normal in size with greater than 50% respiratory variability, suggesting right atrial pressure of 3 mmHg.  EEG -      Disposition Plan  :    Status is: Inp  DVT Prophylaxis  :    enoxaparin (LOVENOX) injection 40 mg Start: 01/20/22 1000  Lab Results  Component Value Date   PLT 206 01/22/2022    Diet :  Diet Order             Diet Heart Room service appropriate? Yes; Fluid consistency: Thin  Diet effective now                    Inpatient Medications  Scheduled Meds:  carvedilol  6.25 mg Oral BID WC   chlordiazePOXIDE  20 mg Oral TID   enoxaparin (LOVENOX) injection  40 mg Subcutaneous Q24H   feeding supplement  237 mL Oral BID BM   folic acid  1 mg Oral Daily   multivitamin with minerals  1 tablet Oral Daily   nicotine  21 mg Transdermal QHS   thiamine  100 mg Oral Daily   Or   thiamine  100 mg Intravenous Daily   Continuous Infusions:  dextrose 5% lactated ringers 75 mL/hr at 01/22/22 0539   PRN Meds:.acetaminophen **OR** acetaminophen, cloNIDine, haloperidol lactate, LORazepam **OR** LORazepam  Antibiotics  :    Anti-infectives (From admission, onward)    None        Time Spent in minutes  [place] M.D on 01/22/2022 at 9:47 AM  To page go to [email or web address]   [facility] -  Office  [phone]  See all Orders from today for further details    Objective:   Vitals:   01/21/22 2000 01/22/22 0000 01/22/22 0600 01/22/22 0855  BP: 114/61 (!) 101/57 124/69 (!) 149/76  Pulse: 66 65 67 82  Resp:  18 (!) 21   Temp:  (!) 97 F (36.1 C) 97.6 F (36.4 C) 98.5 F (36.9 C)  TempSrc:  Oral Oral Oral  SpO2:  97%    Weight:       Height:        Wt Readings from Last 3 Encounters:  01/19/22 77.1 kg  10/27/21 73.9 kg  10/13/21 76.2 kg     Intake/Output Summary (Last 24 hours) at 01/22/2022 0947 Last data filed at 01/22/2022 7322 Gross per 24 hour  Intake 1339.09 ml  Output 400 ml  Net 939.09 ml     Physical Exam  Awake & much calmer, moving all 4 extremities by self, right BKA , oriented x 2 [place].AT,PERRAL Supple Neck, No JVD,   Symmetrical Chest wall movement, Good air movement bilaterally, CTAB RRR,No Gallops, Rubs or new Murmurs,  +ve B.Sounds, Abd Soft, No tenderness,   No Cyanosis, Clubbing or edema       Data Review:    CBC Recent Labs  Lab 01/19/22 1440 01/20/22 0238 01/21/22 0717 01/22/22 0356  WBC 19.0* 13.8* 12.2* 6.7  HGB 16.3 15.3 15.1 13.9  HCT 47.0 45.2 43.8 40.6  PLT 278 233 227 206  MCV 99.4 100.2* 100.2* 100.0  MCH 34.5* 33.9 34.6* 34.2*  MCHC 34.7 33.8 34.5 34.2  RDW 13.4 13.3 12.8 12.7  LYMPHSABS  --   --  1.9 2.1  MONOABS  --   --  1.0 0.6  EOSABS  --   --  0.1 0.2  BASOSABS  --   --  0.0 0.0    Electrolytes Recent Labs  Lab 01/19/22 1440 01/19/22 1728 01/19/22 1756 01/20/22 0238 01/21/22 0717 01/21/22 0719 01/22/22 0356  NA 130* 134*  --   --  138  --  139  K 7.0* 4.9  --   --  4.0  --  3.6  CL 101 102  --   --  104  --  104  CO2 16* 23  --   --  22  --  27  GLUCOSE 240* 137*  --   --  93  --  111*  BUN 15 14  --   --  18  --  17  CREATININE 0.96 0.88  --   --  1.02  --  0.87  CALCIUM 8.4* 9.1  --   --  9.2  --  8.7*  AST  --  40  --   --  53*  --  40  ALT  --  26  --   --  36  --  32  ALKPHOS  --  73  --   --  73  --  64  BILITOT  --  2.2*  --   --  1.6*  --  0.8  ALBUMIN  --  3.7  --   --  3.6  --  3.0*  MG  --   --   --   --  1.9  --  2.2  PROCALCITON  --   --   --  0.12 <0.10  --  <  0.10  AMMONIA  --   --  28  --   --   --   --   BNP  --   --   --   --   --  33.9 19.1     ------------------------------------------------------------------------------------------------------------------ No results for input(s): CHOL, HDL, LDLCALC, TRIG, CHOLHDL, LDLDIRECT in the last 72 hours.  No results found for: HGBA1C  No results for input(s): TSH, T4TOTAL, T3FREE, THYROIDAB in the last 72 hours.  Invalid input(s): FREET3 ------------------------------------------------------------------------------------------------------------------ ID Labs Recent Labs  Lab 01/19/22 1440 01/19/22 1728 01/20/22 0238 01/21/22 0717 01/22/22 0356  WBC 19.0*  --  13.8* 12.2* 6.7  PLT 278  --  233 227 206  PROCALCITON  --   --  0.12 <0.10 <0.10  CREATININE 0.96 0.88  --  1.02 0.87   Cardiac Enzymes No results for input(s): CKMB, TROPONINI, MYOGLOBIN in the last 168 hours.  Invalid input(s): CK   Radiology Reports DG Chest 1 View  Result Date: 01/20/2022 CLINICAL DATA:  Provided history: Shortness of breath, syncopal episode/possible seizure. EXAM: CHEST  1 VIEW COMPARISON:  Prior chest radiographs 12/17/2021 and earlier. FINDINGS: Heart size within normal limits. Mild ill-defined opacity within the bilateral lung bases. No evidence of pleural effusion or pneumothorax. No acute bony abnormality identified. IMPRESSION: Mild ill-defined opacity within the bilateral lung bases, which may reflect atelectasis. Pneumonia is difficult to exclude and clinical correlation is recommended. Electronically Signed   By: Kellie Simmering D.O.   On: 01/20/2022 09:02   DG Abdomen 1 View  Result Date: 01/19/2022 CLINICAL DATA:  Trauma EXAM: ABDOMEN - 1 VIEW COMPARISON:  CT done on 09/23/2021 FINDINGS: Bowel gas pattern is nonspecific. No abnormal masses or calcifications are seen. Kidneys are partly obscured by bowel contents. Degenerative changes are noted at lumbosacral junction. There is previous right hip arthroplasty. IMPRESSION: Nonspecific bowel gas pattern. Electronically Signed   By: Elmer Picker M.D.   On: 01/19/2022 16:51   CT Head Wo Contrast  Result Date: 01/19/2022 CLINICAL DATA:  Seizure. EXAM: CT HEAD WITHOUT CONTRAST TECHNIQUE: Contiguous axial images were obtained from the base of the skull through the vertex without intravenous contrast. RADIATION DOSE REDUCTION: This exam was performed according to the departmental dose-optimization program which includes automated exposure control, adjustment of the mA and/or kV according to patient size and/or use of iterative reconstruction technique. COMPARISON:  Head CT dated 05/18/2018. FINDINGS: Brain: Mild age-related atrophy and chronic microvascular ischemic changes. There is no acute intracranial hemorrhage. No mass effect or midline shift. No extra-axial fluid collection. Vascular: No hyperdense vessel or unexpected calcification. Skull: Normal. Negative for fracture or focal lesion. Sinuses/Orbits: No acute finding. Other: None IMPRESSION: 1. No acute intracranial pathology. 2. Mild age-related atrophy and chronic microvascular ischemic changes. Electronically Signed   By: Anner Crete M.D.   On: 01/19/2022 20:11   MR BRAIN WO CONTRAST  Result Date: 01/20/2022 CLINICAL DATA:  Seizure disorder, clinical change EXAM: MRI HEAD WITHOUT CONTRAST TECHNIQUE: Multiplanar, multiecho pulse sequences of the brain and surrounding structures were obtained without intravenous contrast. COMPARISON:  CT head January 19, 2022.  MRI 12/21/2017. FINDINGS: Brain: No acute infarction, hemorrhage, hydrocephalus, extra-axial collection or mass lesion. Mild scattered T2/FLAIR hyperintensities in the white matter, nonspecific but compatible with chronic microvascular ischemic disease that is very mild for patient age. Mild atrophy. Vascular: Major arterial flow voids are maintained skull base. Skull and upper cervical spine: Normal marrow signal. Sinuses/Orbits: Negative. Other: No mastoid effusions IMPRESSION: 1.  No evidence of acute intracranial  abnormality on this mildly motion limited study. 2. Mild cerebral atrophy (ICD10-G31.9). Electronically Signed   By: Margaretha Sheffield M.D.   On: 01/20/2022 10:39   DG Shoulder Left  Result Date: 01/19/2022 CLINICAL DATA:  Trauma EXAM: LEFT SHOULDER - 2+ VIEW COMPARISON:  07/15/2021 FINDINGS: No fracture or dislocation is seen. Degenerative changes are noted in the left [facility] joint with bony spurs. There are no abnormal soft tissue calcifications. IMPRESSION: No fracture or dislocation is seen in the left shoulder. Electronically Signed   By: Elmer Picker M.D.   On: 01/19/2022 16:48   EEG adult  Result Date: 01/21/2022 Greta Doom, MD     01/21/2022 10:52 AM History: 71 yo M being evaluated for syncope Sedation: none Technique: This EEG was acquired with electrodes placed according to the International 10-20 electrode system (including Fp1, Fp2, F3, F4, C3, C4, P3, P4, O1, O2, T3, T4, T5, T6, A1, A2, Fz, Cz, Pz). The following electrodes were missing or displaced: none. Background: The background consists of intermixed alpha and beta activities. There is a well defined posterior dominant rhythm of 9 Hz that attenuates with eye opening. Sleep is not recorded. Photic stimulation: Physiologic driving is not performed EEG Abnormalities: none Clinical Interpretation: This normal EEG is recorded in the waking state. There was no seizure or seizure predisposition recorded on this study. Please note that lack of epileptiform activity on EEG does not preclude the possibility of epilepsy. Roland Rack, MD [facility] [phone] If 7pm- 7am, please page neurology on call as listed in [place].   ECHOCARDIOGRAM COMPLETE  Result Date: 01/20/2022    ECHOCARDIOGRAM REPORT   Patient Name:   DAVIN ARCHULETTA Date of Exam: 01/20/2022 Medical Rec #:  [id number]       Height:       68.0 in Accession #:    [id number]      Weight:       170.0 lb Date of Birth:  31-May-1951      BSA:          1.907  m Patient Age:    80 years        BP:           149/76 mmHg Patient Gender: M               HR:           75 bpm. Exam Location:  Inpatient Procedure: 2D Echo, Cardiac Doppler and Color Doppler Indications:    Syncope  History:        Patient has no prior history of Echocardiogram examinations.  Sonographer:    Jyl Heinz Referring Phys: [id number] [place]  1. Left ventricular ejection fraction, by estimation, is 60 to 65%. The left ventricle has normal function. The left ventricle has no regional wall motion abnormalities. Left ventricular diastolic parameters are consistent with Grade I diastolic dysfunction (impaired relaxation).  2. Right ventricular systolic function is normal. The right ventricular size is normal.  3. The mitral valve is normal in structure. Trivial mitral valve regurgitation.  4. The aortic valve is tricuspid. There is mild calcification of the aortic valve. There is mild thickening of the aortic valve. Aortic valve regurgitation is mild.  5. The inferior vena cava is normal in size with greater than 50% respiratory variability, suggesting right atrial pressure of 3 mmHg. Comparison(s): No prior Echocardiogram. FINDINGS  Left Ventricle: Left ventricular ejection fraction, by estimation, is 60  to 65%. The left ventricle has normal function. The left ventricle has no regional wall motion abnormalities. The left ventricular internal cavity size was normal in size. There is  no left ventricular hypertrophy. Left ventricular diastolic parameters are consistent with Grade I diastolic dysfunction (impaired relaxation). Right Ventricle: The right ventricular size is normal. No increase in right ventricular wall thickness. Right ventricular systolic function is normal. Left Atrium: Left atrial size was normal in size. Right Atrium: Right atrial size was normal in size. Pericardium: There is no evidence of pericardial effusion. Mitral Valve: The mitral valve is normal in  structure. Mild mitral annular calcification. Trivial mitral valve regurgitation. Tricuspid Valve: The tricuspid valve is normal in structure. Tricuspid valve regurgitation is trivial. Aortic Valve: The aortic valve is tricuspid. There is mild calcification of the aortic valve. There is mild thickening of the aortic valve. Aortic valve regurgitation is mild. Aortic regurgitation PHT measures 595 msec. Aortic valve peak gradient measures 9.4 mmHg. Pulmonic Valve: The pulmonic valve was normal in structure. Pulmonic valve regurgitation is trivial. Aorta: The aortic root and ascending aorta are structurally normal, with no evidence of dilitation. Venous: The inferior vena cava is normal in size with greater than 50% respiratory variability, suggesting right atrial pressure of 3 mmHg. IAS/Shunts: The atrial septum is grossly normal.  LEFT VENTRICLE PLAX 2D LVIDd:         4.30 cm      Diastology LVIDs:         3.00 cm      LV e' medial:    7.46 cm/s LV PW:         1.00 cm      LV E/e' medial:  8.0 LV IVS:        1.10 cm      LV e' lateral:   9.17 cm/s LVOT diam:     2.00 cm      LV E/e' lateral: 6.5 LV SV:         90 LV SV Index:   47 LVOT Area:     3.14 cm  LV Volumes (MOD) LV vol d, MOD A2C: 92.5 ml LV vol d, MOD A4C: 103.0 ml LV vol s, MOD A2C: 36.0 ml LV vol s, MOD A4C: 37.2 ml LV SV MOD A2C:     56.5 ml LV SV MOD A4C:     103.0 ml LV SV MOD BP:      64.8 ml RIGHT VENTRICLE             IVC RV Basal diam:  3.10 cm     IVC diam: 1.70 cm RV Mid diam:    2.70 cm RV S prime:     11.80 cm/s TAPSE (M-mode): 2.2 cm LEFT ATRIUM             Index        RIGHT ATRIUM           Index LA diam:        3.70 cm 1.94 cm/m   RA Area:     12.90 cm LA Vol (A2C):   40.6 ml 21.29 ml/m  RA Volume:   28.60 ml  14.99 ml/m LA Vol (A4C):   39.2 ml 20.55 ml/m LA Biplane Vol: 40.0 ml 20.97 ml/m  AORTIC VALVE AV Area (Vmax): 2.75 cm AV Vmax:        153.00 cm/s AV Peak Grad:   9.4 mmHg LVOT Vmax:      134.00 cm/s LVOT  Vmean:     107.000  cm/s LVOT VTI:       0.285 m AI PHT:         595 msec  AORTA Ao Root diam: 3.30 cm Ao Asc diam:  3.20 cm MITRAL VALVE MV Area (PHT): 2.75 cm    SHUNTS MV Decel Time: 276 msec    Systemic VTI:  0.29 m MV E velocity: 59.60 cm/s  Systemic Diam: 2.00 cm MV A velocity: 67.30 cm/s MV E/A ratio:  0.89 Gwyndolyn Kaufman MD Electronically signed by Gwyndolyn Kaufman MD Signature Date/Time: 01/20/2022/1:12:27 PM    Final

## 2022-01-23 LAB — CBC WITH DIFFERENTIAL/PLATELET
Abs Immature Granulocytes: 0.03 10*3/uL (ref 0.00–0.07)
Basophils Absolute: 0 10*3/uL (ref 0.0–0.1)
Basophils Relative: 0 %
Eosinophils Absolute: 0.3 10*3/uL (ref 0.0–0.5)
Eosinophils Relative: 3 %
HCT: 39.9 % (ref 39.0–52.0)
Hemoglobin: 13.5 g/dL (ref 13.0–17.0)
Immature Granulocytes: 0 %
Lymphocytes Relative: 27 %
Lymphs Abs: 2.1 10*3/uL (ref 0.7–4.0)
MCH: 34.7 pg — ABNORMAL HIGH (ref 26.0–34.0)
MCHC: 33.8 g/dL (ref 30.0–36.0)
MCV: 102.6 fL — ABNORMAL HIGH (ref 80.0–100.0)
Monocytes Absolute: 0.7 10*3/uL (ref 0.1–1.0)
Monocytes Relative: 9 %
Neutro Abs: 4.8 10*3/uL (ref 1.7–7.7)
Neutrophils Relative %: 61 %
Platelets: 205 10*3/uL (ref 150–400)
RBC: 3.89 MIL/uL — ABNORMAL LOW (ref 4.22–5.81)
RDW: 12.8 % (ref 11.5–15.5)
WBC: 8 10*3/uL (ref 4.0–10.5)
nRBC: 0 % (ref 0.0–0.2)

## 2022-01-23 LAB — COMPREHENSIVE METABOLIC PANEL
ALT: 44 U/L (ref 0–44)
AST: 43 U/L — ABNORMAL HIGH (ref 15–41)
Albumin: 2.9 g/dL — ABNORMAL LOW (ref 3.5–5.0)
Alkaline Phosphatase: 61 U/L (ref 38–126)
Anion gap: 8 (ref 5–15)
BUN: 15 mg/dL (ref 8–23)
CO2: 25 mmol/L (ref 22–32)
Calcium: 8.4 mg/dL — ABNORMAL LOW (ref 8.9–10.3)
Chloride: 108 mmol/L (ref 98–111)
Creatinine, Ser: 0.79 mg/dL (ref 0.61–1.24)
GFR, Estimated: >60 mL/min (ref >60)
Glucose, Bld: 134 mg/dL — ABNORMAL HIGH (ref 70–99)
Potassium: 3.7 mmol/L (ref 3.5–5.1)
Sodium: 141 mmol/L (ref 135–145)
Total Bilirubin: 0.5 mg/dL (ref 0.3–1.2)
Total Protein: 5.8 g/dL — ABNORMAL LOW (ref 6.5–8.1)

## 2022-01-23 LAB — MAGNESIUM: Magnesium: 2 mg/dL (ref 1.7–2.4)

## 2022-01-23 LAB — BRAIN NATRIURETIC PEPTIDE: B Natriuretic Peptide: 21.1 pg/mL (ref 0.0–100.0)

## 2022-01-23 MED ORDER — CHLORDIAZEPOXIDE HCL 5 MG PO CAPS
15.0000 mg | ORAL_CAPSULE | Freq: Three times a day (TID) | ORAL | Status: DC
  Administered 2022-01-23 – 2022-01-25 (×6): 15 mg via ORAL
  Filled 2022-01-23 (×6): qty 3

## 2022-01-23 NOTE — Progress Notes (Signed)
Patient called stating his IV was leaking. Reassessed site. Site red, but not tender or painful per patient. IV fluids stopped. IV team consult placed for pt to have a new IV placed. Pt made aware.

## 2022-01-23 NOTE — Progress Notes (Signed)
PROGRESS NOTE                                                                                                                                                                                                             Patient Demographics:    Austin Dillon, is a 71 y.o. male, DOB - May 27, 1951, CZY:[id number]  Outpatient Primary MD for the patient is Baxter Hire, MD    LOS - 3  Admit date - 01/19/2022    Chief Complaint  Patient presents with   Fall       Brief Narrative (HPI from H&P)  -  Austin Dillon is a 71 y.o. 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  EMS reported lethargy, poor appetite, and diarrhea.  He had 1 episode of vomiting in route and was given a 250 cc fluid bolus.  Wife reported to ED provider that patient was sitting on the toilet having a bowel movement when all of a sudden he lost consciousness and fell off the commode onto his left side.  Patient complained of left shoulder pain.  Wife reported that patient had 4-5 minutes of jerking with his eyes rolling back in his head.  He also apparently intermittently drinks a lot of alcohol, he had about 18 cans of beer 2 days before this episode, according to the wife for the last 2 weeks he has been drinking on a daily basis, he had a history of alcohol abuse but had quit for a long time and has recently resumed drinking.  He did not drink any alcohol yesterday.  He was admitted here for seizure-like activity and syncope work-up.   Subjective:   Patient in bed, appears comfortable, denies any headache, no fever, no chest pain or pressure, no shortness of breath , no abdominal pain. No new focal weakness.   Assessment  & Plan :    Acute metabolic encephalopathy with possible  vasovagal  syncope versus seizure - in setting of recent heavy alcohol use but none on the day of this episode.  CT of the head and MRI brain unremarkable, EEG pending, neurology on board.  This episode could have been alcohol related seizure versus vasovagal, will  defer further management to neurology.  Currently no further seizure-like episodes.  2.  Ongoing heavy alcohol abuse.  Went into full-blown DTs on 01/21/2022, now gradually improving after being placed on scheduled high-dose Librium - taper, CIWA protocol , PRN Haldol , stop IVF on 01/23/22, continue to monitor counseled to quit alcohol.  3.  Alzheimer's dementia.  At risk for delirium.  Minimize narcotics and benzodiazepines as much as possible, for now requiring CIWA protocol.  Monitor.  4.  Reported diarrhea prior to coming here.  None in the hospital.  GI pathogen panel ordered monitor with supportive care.  Abdominal exam is benign.  5.  Reactionary leukocytosis.  Improving monitor.  Abdominal x-ray unremarkable, chest x-ray has bilateral atelectasis will monitor for any signs of pneumonia, none thus far.  Add incentive spirometer.  6.  Underlying anxiety depression.  Home dose Cymbalta.  7.  Hypertension.  Placed on Coreg + PRN Catapres will monitor.  8.  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  Supportive care.       Condition - Extremely Guarded  Family Communication  :  wife Mardene Celeste [phone]  on 01/20/22, 01/21/22, 01/23/22  Code Status :  Full  Consults  :  Neuro  PUD Prophylaxis :    Procedures  :     CT - Non acute   MRI  - 1. No evidence of acute intracranial abnormality on this mildly motion limited study. 2. Mild cerebral atrophy  TTE- 1. Left ventricular ejection fraction, by estimation, is 60 to 65%. The left ventricle has normal function. The left ventricle has no regional wall motion abnormalities. Left ventricular diastolic parameters are consistent with Grade I diastolic dysfunction (impaired relaxation).  2.  Right ventricular systolic function is normal. The right ventricular size is normal.  3. The mitral valve is normal in structure. Trivial mitral valve regurgitation.  4. The aortic valve is tricuspid. There is mild calcification of the aortic valve. There is mild thickening of the aortic valve. Aortic valve regurgitation is mild.  5. The inferior vena cava is normal in size with greater than 50% respiratory variability, suggesting right atrial pressure of 3 mmHg.  EEG -      Disposition Plan  :    Status is: Inp  DVT Prophylaxis  :    enoxaparin (LOVENOX) injection 40 mg Start: 01/20/22 1000  Lab Results  Component Value Date   PLT 205 01/23/2022    Diet :  Diet Order             Diet Heart Room service appropriate? Yes; Fluid consistency: Thin  Diet effective now                    Inpatient Medications  Scheduled Meds:  carvedilol  6.25 mg Oral BID WC   chlordiazePOXIDE  15 mg Oral TID   enoxaparin (LOVENOX) injection  40 mg Subcutaneous Q24H   feeding supplement  237 mL Oral BID BM   folic acid  1 mg Oral Daily   multivitamin with minerals  1 tablet Oral Daily   nicotine  21 mg Transdermal QHS   thiamine  100 mg Oral Daily   Or   thiamine  100 mg Intravenous Daily   Continuous Infusions:   PRN Meds:.acetaminophen **OR** acetaminophen, cloNIDine, haloperidol lactate  Antibiotics  :    Anti-infectives (From admission, onward)    None        Time Spent in minutes  [place] M.D  on 01/23/2022 at 10:14 AM  To page go to [email or web address]   [facility] -  Office  [phone]  See all Orders from today for further details    Objective:   Vitals:   01/22/22 1940 01/23/22 0000 01/23/22 0400 01/23/22 0718  BP: (!) 129/59 (!) 114/56 115/63 120/72  Pulse: 76 70 61 70  Resp: (!) 22 19 15 17   Temp: 99 F (37.2 C) 98 F (36.7 C) 98.6 F (37 C) 98.5 F (36.9 C)  TempSrc: Oral  Oral Oral  SpO2: 98% 95% 98% 97%  Weight:   76 kg    Height:        Wt Readings from Last 3 Encounters:  01/23/22 76 kg  10/27/21 73.9 kg  10/13/21 76.2 kg     Intake/Output Summary (Last 24 hours) at 01/23/2022 1014 Last data filed at 01/23/2022 5188 Gross per 24 hour  Intake 2018.61 ml  Output 502 ml  Net 1516.61 ml     Physical Exam  Awake Alert, No new F.N deficits, Normal affect [place].AT,PERRAL Supple Neck, No JVD,   Symmetrical Chest wall movement, Good air movement bilaterally, CTAB RRR,No Gallops, Rubs or new Murmurs,  +ve B.Sounds, Abd Soft, No tenderness,   R-BKA      Data Review:    CBC Recent Labs  Lab 01/19/22 1440 01/20/22 0238 01/21/22 0717 01/22/22 0356 01/23/22 0458  WBC 19.0* 13.8* 12.2* 6.7 8.0  HGB 16.3 15.3 15.1 13.9 13.5  HCT 47.0 45.2 43.8 40.6 39.9  PLT 278 233 227 206 205  MCV 99.4 100.2* 100.2* 100.0 102.6*  MCH 34.5* 33.9 34.6* 34.2* 34.7*  MCHC 34.7 33.8 34.5 34.2 33.8  RDW 13.4 13.3 12.8 12.7 12.8  LYMPHSABS  --   --  1.9 2.1 2.1  MONOABS  --   --  1.0 0.6 0.7  EOSABS  --   --  0.1 0.2 0.3  BASOSABS  --   --  0.0 0.0 0.0    Electrolytes Recent Labs  Lab 01/19/22 1440 01/19/22 1728 01/19/22 1756 01/20/22 0238 01/21/22 0717 01/21/22 0719 01/22/22 0356 01/23/22 0458  NA 130* 134*  --   --  138  --  139 141  K 7.0* 4.9  --   --  4.0  --  3.6 3.7  CL 101 102  --   --  104  --  104 108  CO2 16* 23  --   --  22  --  27 25  GLUCOSE 240* 137*  --   --  93  --  111* 134*  BUN 15 14  --   --  18  --  17 15  CREATININE 0.96 0.88  --   --  1.02  --  0.87 0.79  CALCIUM 8.4* 9.1  --   --  9.2  --  8.7* 8.4*  AST  --  40  --   --  53*  --  40 43*  ALT  --  26  --   --  36  --  32 44  ALKPHOS  --  73  --   --  73  --  64 61  BILITOT  --  2.2*  --   --  1.6*  --  0.8 0.5  ALBUMIN  --  3.7  --   --  3.6  --  3.0* 2.9*  MG  --   --   --   --  1.9  --  2.2 2.0  PROCALCITON  --   --   --  0.12 <0.10  --  <0.10  --   AMMONIA  --   --  28  --   --   --   --   --   BNP  --   --   --    --   --  33.9 19.1 21.1    ------------------------------------------------------------------------------------------------------------------ No results for input(s): CHOL, HDL, LDLCALC, TRIG, CHOLHDL, LDLDIRECT in the last 72 hours.  No results found for: HGBA1C  No results for input(s): TSH, T4TOTAL, T3FREE, THYROIDAB in the last 72 hours.  Invalid input(s): FREET3 ------------------------------------------------------------------------------------------------------------------ ID Labs Recent Labs  Lab 01/19/22 1440 01/19/22 1728 01/20/22 0238 01/21/22 0717 01/22/22 0356 01/23/22 0458  WBC 19.0*  --  13.8* 12.2* 6.7 8.0  PLT 278  --  233 227 206 205  PROCALCITON  --   --  0.12 <0.10 <0.10  --   CREATININE 0.96 0.88  --  1.02 0.87 0.79   Cardiac Enzymes No results for input(s): CKMB, TROPONINI, MYOGLOBIN in the last 168 hours.  Invalid input(s): CK   Radiology Reports DG Chest 1 View  Result Date: 01/20/2022 CLINICAL DATA:  Provided history: Shortness of breath, syncopal episode/possible seizure. EXAM: CHEST  1 VIEW COMPARISON:  Prior chest radiographs 12/17/2021 and earlier. FINDINGS: Heart size within normal limits. Mild ill-defined opacity within the bilateral lung bases. No evidence of pleural effusion or pneumothorax. No acute bony abnormality identified. IMPRESSION: Mild ill-defined opacity within the bilateral lung bases, which may reflect atelectasis. Pneumonia is difficult to exclude and clinical correlation is recommended. Electronically Signed   By: Kellie Simmering D.O.   On: 01/20/2022 09:02   DG Abdomen 1 View  Result Date: 01/19/2022 CLINICAL DATA:  Trauma EXAM: ABDOMEN - 1 VIEW COMPARISON:  CT done on 09/23/2021 FINDINGS: Bowel gas pattern is nonspecific. No abnormal masses or calcifications are seen. Kidneys are partly obscured by bowel contents. Degenerative changes are noted at lumbosacral junction. There is previous right hip arthroplasty. IMPRESSION:  Nonspecific bowel gas pattern. Electronically Signed   By: Elmer Picker M.D.   On: 01/19/2022 16:51   CT Head Wo Contrast  Result Date: 01/19/2022 CLINICAL DATA:  Seizure. EXAM: CT HEAD WITHOUT CONTRAST TECHNIQUE: Contiguous axial images were obtained from the base of the skull through the vertex without intravenous contrast. RADIATION DOSE REDUCTION: This exam was performed according to the departmental dose-optimization program which includes automated exposure control, adjustment of the mA and/or kV according to patient size and/or use of iterative reconstruction technique. COMPARISON:  Head CT dated 05/18/2018. FINDINGS: Brain: Mild age-related atrophy and chronic microvascular ischemic changes. There is no acute intracranial hemorrhage. No mass effect or midline shift. No extra-axial fluid collection. Vascular: No hyperdense vessel or unexpected calcification. Skull: Normal. Negative for fracture or focal lesion. Sinuses/Orbits: No acute finding. Other: None IMPRESSION: 1. No acute intracranial pathology. 2. Mild age-related atrophy and chronic microvascular ischemic changes. Electronically Signed   By: Anner Crete M.D.   On: 01/19/2022 20:11   MR BRAIN WO CONTRAST  Result Date: 01/20/2022 CLINICAL DATA:  Seizure disorder, clinical change EXAM: MRI HEAD WITHOUT CONTRAST TECHNIQUE: Multiplanar, multiecho pulse sequences of the brain and surrounding structures were obtained without intravenous contrast. COMPARISON:  CT head January 19, 2022.  MRI 12/21/2017. FINDINGS: Brain: No acute infarction, hemorrhage, hydrocephalus, extra-axial collection or mass lesion. Mild scattered T2/FLAIR hyperintensities in the white matter, nonspecific but compatible with chronic microvascular ischemic disease that is very mild  for patient age. Mild atrophy. Vascular: Major arterial flow voids are maintained skull base. Skull and upper cervical spine: Normal marrow signal. Sinuses/Orbits: Negative. Other: No  mastoid effusions IMPRESSION: 1. No evidence of acute intracranial abnormality on this mildly motion limited study. 2. Mild cerebral atrophy (ICD10-G31.9). Electronically Signed   By: Margaretha Sheffield M.D.   On: 01/20/2022 10:39   DG Shoulder Left  Result Date: 01/19/2022 CLINICAL DATA:  Trauma EXAM: LEFT SHOULDER - 2+ VIEW COMPARISON:  07/15/2021 FINDINGS: No fracture or dislocation is seen. Degenerative changes are noted in the left [facility] joint with bony spurs. There are no abnormal soft tissue calcifications. IMPRESSION: No fracture or dislocation is seen in the left shoulder. Electronically Signed   By: Elmer Picker M.D.   On: 01/19/2022 16:48   EEG adult  Result Date: 01/21/2022 Greta Doom, MD     01/21/2022 10:52 AM History: 71 yo M being evaluated for syncope Sedation: none Technique: This EEG was acquired with electrodes placed according to the International 10-20 electrode system (including Fp1, Fp2, F3, F4, C3, C4, P3, P4, O1, O2, T3, T4, T5, T6, A1, A2, Fz, Cz, Pz). The following electrodes were missing or displaced: none. Background: The background consists of intermixed alpha and beta activities. There is a well defined posterior dominant rhythm of 9 Hz that attenuates with eye opening. Sleep is not recorded. Photic stimulation: Physiologic driving is not performed EEG Abnormalities: none Clinical Interpretation: This normal EEG is recorded in the waking state. There was no seizure or seizure predisposition recorded on this study. Please note that lack of epileptiform activity on EEG does not preclude the possibility of epilepsy. Roland Rack, MD [facility] [phone] If 7pm- 7am, please page neurology on call as listed in [place].   ECHOCARDIOGRAM COMPLETE  Result Date: 01/20/2022    ECHOCARDIOGRAM REPORT   Patient Name:   Austin Dillon Date of Exam: 01/20/2022 Medical Rec #:  [id number]       Height:       68.0 in Accession #:    [id number]      Weight:        170.0 lb Date of Birth:  Feb 17, 1951      BSA:          1.907 m Patient Age:    49 years        BP:           149/76 mmHg Patient Gender: M               HR:           75 bpm. Exam Location:  Inpatient Procedure: 2D Echo, Cardiac Doppler and Color Doppler Indications:    Syncope  History:        Patient has no prior history of Echocardiogram examinations.  Sonographer:    Jyl Heinz Referring Phys: [id number] [place]  1. Left ventricular ejection fraction, by estimation, is 60 to 65%. The left ventricle has normal function. The left ventricle has no regional wall motion abnormalities. Left ventricular diastolic parameters are consistent with Grade I diastolic dysfunction (impaired relaxation).  2. Right ventricular systolic function is normal. The right ventricular size is normal.  3. The mitral valve is normal in structure. Trivial mitral valve regurgitation.  4. The aortic valve is tricuspid. There is mild calcification of the aortic valve. There is mild thickening of the aortic valve. Aortic valve regurgitation is mild.  5. The inferior vena cava is normal in  size with greater than 50% respiratory variability, suggesting right atrial pressure of 3 mmHg. Comparison(s): No prior Echocardiogram. FINDINGS  Left Ventricle: Left ventricular ejection fraction, by estimation, is 60 to 65%. The left ventricle has normal function. The left ventricle has no regional wall motion abnormalities. The left ventricular internal cavity size was normal in size. There is  no left ventricular hypertrophy. Left ventricular diastolic parameters are consistent with Grade I diastolic dysfunction (impaired relaxation). Right Ventricle: The right ventricular size is normal. No increase in right ventricular wall thickness. Right ventricular systolic function is normal. Left Atrium: Left atrial size was normal in size. Right Atrium: Right atrial size was normal in size. Pericardium: There is no evidence of  pericardial effusion. Mitral Valve: The mitral valve is normal in structure. Mild mitral annular calcification. Trivial mitral valve regurgitation. Tricuspid Valve: The tricuspid valve is normal in structure. Tricuspid valve regurgitation is trivial. Aortic Valve: The aortic valve is tricuspid. There is mild calcification of the aortic valve. There is mild thickening of the aortic valve. Aortic valve regurgitation is mild. Aortic regurgitation PHT measures 595 msec. Aortic valve peak gradient measures 9.4 mmHg. Pulmonic Valve: The pulmonic valve was normal in structure. Pulmonic valve regurgitation is trivial. Aorta: The aortic root and ascending aorta are structurally normal, with no evidence of dilitation. Venous: The inferior vena cava is normal in size with greater than 50% respiratory variability, suggesting right atrial pressure of 3 mmHg. IAS/Shunts: The atrial septum is grossly normal.  LEFT VENTRICLE PLAX 2D LVIDd:         4.30 cm      Diastology LVIDs:         3.00 cm      LV e' medial:    7.46 cm/s LV PW:         1.00 cm      LV E/e' medial:  8.0 LV IVS:        1.10 cm      LV e' lateral:   9.17 cm/s LVOT diam:     2.00 cm      LV E/e' lateral: 6.5 LV SV:         90 LV SV Index:   47 LVOT Area:     3.14 cm  LV Volumes (MOD) LV vol d, MOD A2C: 92.5 ml LV vol d, MOD A4C: 103.0 ml LV vol s, MOD A2C: 36.0 ml LV vol s, MOD A4C: 37.2 ml LV SV MOD A2C:     56.5 ml LV SV MOD A4C:     103.0 ml LV SV MOD BP:      64.8 ml RIGHT VENTRICLE             IVC RV Basal diam:  3.10 cm     IVC diam: 1.70 cm RV Mid diam:    2.70 cm RV S prime:     11.80 cm/s TAPSE (M-mode): 2.2 cm LEFT ATRIUM             Index        RIGHT ATRIUM           Index LA diam:        3.70 cm 1.94 cm/m   RA Area:     12.90 cm LA Vol (A2C):   40.6 ml 21.29 ml/m  RA Volume:   28.60 ml  14.99 ml/m LA Vol (A4C):   39.2 ml 20.55 ml/m LA Biplane Vol: 40.0 ml 20.97 ml/m  AORTIC VALVE AV Area (Vmax):  2.75 cm AV Vmax:        153.00 cm/s AV Peak Grad:    9.4 mmHg LVOT Vmax:      134.00 cm/s LVOT Vmean:     107.000 cm/s LVOT VTI:       0.285 m AI PHT:         595 msec  AORTA Ao Root diam: 3.30 cm Ao Asc diam:  3.20 cm MITRAL VALVE MV Area (PHT): 2.75 cm    SHUNTS MV Decel Time: 276 msec    Systemic VTI:  0.29 m MV E velocity: 59.60 cm/s  Systemic Diam: 2.00 cm MV A velocity: 67.30 cm/s MV E/A ratio:  0.89 Gwyndolyn Kaufman MD Electronically signed by Gwyndolyn Kaufman MD Signature Date/Time: 01/20/2022/1:12:27 PM    Final

## 2022-01-23 NOTE — Progress Notes (Signed)
Received IVT consult. MAR reviewed. No IV meds ordered. Haldol PRN IV last dose on 2/17. Instructed RN to place a consult if IVmeds/treatments are ordered

## 2022-01-23 NOTE — Plan of Care (Signed)
°  Problem: Education: Goal: Knowledge of disease or condition will improve Outcome: Progressing Goal: Understanding of discharge needs will improve Outcome: Progressing   Problem: Health Behavior/Discharge Planning: Goal: Ability to identify changes in lifestyle to reduce recurrence of condition will improve Outcome: Progressing Goal: Identification of resources available to assist in meeting health care needs will improve Outcome: Progressing   Problem: Physical Regulation: Goal: Complications related to the disease process, condition or treatment will be avoided or minimized Outcome: Progressing   Problem: Safety: Goal: Ability to remain free from injury will improve Outcome: Progressing   Problem: Education: Goal: Knowledge of General Education information will improve Description: Including pain rating scale, medication(s)/side effects and non-pharmacologic comfort measures Outcome: Progressing   Problem: Health Behavior/Discharge Planning: Goal: Ability to manage health-related needs will improve Outcome: Progressing   Problem: Clinical Measurements: Goal: Ability to maintain clinical measurements within normal limits will improve Outcome: Progressing Goal: Will remain free from infection Outcome: Progressing Goal: Diagnostic test results will improve Outcome: Progressing Goal: Respiratory complications will improve Outcome: Progressing Goal: Cardiovascular complication will be avoided Outcome: Progressing   Problem: Activity: Goal: Risk for activity intolerance will decrease Outcome: Progressing   Problem: Nutrition: Goal: Adequate nutrition will be maintained Outcome: Progressing   Problem: Coping: Goal: Level of anxiety will decrease Outcome: Progressing   Problem: Elimination: Goal: Will not experience complications related to bowel motility Outcome: Progressing Goal: Will not experience complications related to urinary retention Outcome:  Progressing   Problem: Pain Managment: Goal: General experience of comfort will improve Outcome: Progressing   Problem: Safety: Goal: Ability to remain free from injury will improve Outcome: Progressing   Problem: Skin Integrity: Goal: Risk for impaired skin integrity will decrease Outcome: Progressing   Problem: Safety: Goal: Non-violent Restraint(s) Outcome: Progressing

## 2022-01-24 ENCOUNTER — Encounter (HOSPITAL_COMMUNITY): Payer: No Typology Code available for payment source

## 2022-01-24 DIAGNOSIS — L03113 Cellulitis of right upper limb: Secondary | ICD-10-CM

## 2022-01-24 LAB — CBC WITH DIFFERENTIAL/PLATELET
Abs Immature Granulocytes: 0.04 10*3/uL (ref 0.00–0.07)
Basophils Absolute: 0.1 10*3/uL (ref 0.0–0.1)
Basophils Relative: 1 %
Eosinophils Absolute: 0.2 10*3/uL (ref 0.0–0.5)
Eosinophils Relative: 3 %
HCT: 40.6 % (ref 39.0–52.0)
Hemoglobin: 13.7 g/dL (ref 13.0–17.0)
Immature Granulocytes: 0 %
Lymphocytes Relative: 30 %
Lymphs Abs: 2.7 10*3/uL (ref 0.7–4.0)
MCH: 34.3 pg — ABNORMAL HIGH (ref 26.0–34.0)
MCHC: 33.7 g/dL (ref 30.0–36.0)
MCV: 101.8 fL — ABNORMAL HIGH (ref 80.0–100.0)
Monocytes Absolute: 0.7 10*3/uL (ref 0.1–1.0)
Monocytes Relative: 8 %
Neutro Abs: 5.3 10*3/uL (ref 1.7–7.7)
Neutrophils Relative %: 58 %
Platelets: 216 10*3/uL (ref 150–400)
RBC: 3.99 MIL/uL — ABNORMAL LOW (ref 4.22–5.81)
RDW: 12.7 % (ref 11.5–15.5)
WBC: 9 10*3/uL (ref 4.0–10.5)
nRBC: 0 % (ref 0.0–0.2)

## 2022-01-24 LAB — COMPREHENSIVE METABOLIC PANEL
ALT: 47 U/L — ABNORMAL HIGH (ref 0–44)
AST: 36 U/L (ref 15–41)
Albumin: 3 g/dL — ABNORMAL LOW (ref 3.5–5.0)
Alkaline Phosphatase: 72 U/L (ref 38–126)
Anion gap: 8 (ref 5–15)
BUN: 13 mg/dL (ref 8–23)
CO2: 24 mmol/L (ref 22–32)
Calcium: 8.7 mg/dL — ABNORMAL LOW (ref 8.9–10.3)
Chloride: 107 mmol/L (ref 98–111)
Creatinine, Ser: 0.8 mg/dL (ref 0.61–1.24)
GFR, Estimated: >60 mL/min (ref >60)
Glucose, Bld: 111 mg/dL — ABNORMAL HIGH (ref 70–99)
Potassium: 3.8 mmol/L (ref 3.5–5.1)
Sodium: 139 mmol/L (ref 135–145)
Total Bilirubin: 0.5 mg/dL (ref 0.3–1.2)
Total Protein: 6.1 g/dL — ABNORMAL LOW (ref 6.5–8.1)

## 2022-01-24 LAB — BRAIN NATRIURETIC PEPTIDE: B Natriuretic Peptide: 31.4 pg/mL (ref 0.0–100.0)

## 2022-01-24 LAB — MAGNESIUM: Magnesium: 2.3 mg/dL (ref 1.7–2.4)

## 2022-01-24 MED ORDER — CEPHALEXIN 250 MG PO CAPS
500.0000 mg | ORAL_CAPSULE | Freq: Four times a day (QID) | ORAL | Status: DC
  Administered 2022-01-24 – 2022-01-26 (×6): 500 mg via ORAL
  Filled 2022-01-24 (×6): qty 2

## 2022-01-24 MED ORDER — ASPIRIN 325 MG PO TABS
325.0000 mg | ORAL_TABLET | Freq: Every day | ORAL | Status: DC
  Administered 2022-01-24 – 2022-01-27 (×4): 325 mg via ORAL
  Filled 2022-01-24 (×3): qty 1

## 2022-01-24 NOTE — Progress Notes (Signed)
PROGRESS NOTE                                                                                                                                                                                                             Patient Demographics:    Austin Dillon, is a 71 y.o. male, DOB - July 29, 1951, NLG:[id number]  Outpatient Primary MD for the patient is Baxter Hire, MD    LOS - 4  Admit date - 01/19/2022    Chief Complaint  Patient presents with   Fall       Brief Narrative (HPI from H&P)  -  Austin Dillon is a 71 y.o. 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  EMS reported lethargy, poor appetite, and diarrhea.  He had 1 episode of vomiting in route and was given a 250 cc fluid bolus.  Wife reported to ED provider that patient was sitting on the toilet having a bowel movement when all of a sudden he lost consciousness and fell off the commode onto his left side.  Patient complained of left shoulder pain.  Wife reported that patient had 4-5 minutes of jerking with his eyes rolling back in his head.  He also apparently intermittently drinks a lot of alcohol, he had about 18 cans of beer 2 days before this episode, according to the wife for the last 2 weeks he has been drinking on a daily basis, he had a history of alcohol abuse but had quit for a long time and has recently resumed drinking.  He did not drink any alcohol yesterday.  He was admitted here for seizure-like activity and syncope work-up.   Subjective:   Patient in bed, appears comfortable, denies any headache, no fever, no chest pain or pressure, no shortness of breath , no abdominal pain. No new focal weakness. R arm pain.   Assessment  & Plan :    Acute metabolic encephalopathy with  possible vasovagal  syncope versus seizure - in setting of recent heavy alcohol use but none on the day of this episode.  CT of the head and MRI brain unremarkable, EEG pending, neurology on board.  This episode could have been alcohol related seizure  versus vasovagal, will defer further management to neurology.  Currently no further seizure-like episodes.  2.  Ongoing heavy alcohol abuse.  Went into full-blown DTs on 01/21/2022, now gradually improving after being placed on scheduled high-dose Librium - taper, CIWA protocol , PRN Haldol , stop IVF on 01/23/22, continue to monitor counseled to quit alcohol.  3.  Alzheimer's dementia.  At risk for delirium.  Minimize narcotics and benzodiazepines as much as possible, for now requiring CIWA protocol.  Monitor.  4.  Reported diarrhea prior to coming here.  None in the hospital.  GI pathogen panel ordered monitor with supportive care.  Abdominal exam is benign.  5.  Reactionary leukocytosis.  Improving monitor.  Abdominal x-ray unremarkable, chest x-ray has bilateral atelectasis will monitor for any signs of pneumonia, none thus far.  Add incentive spirometer.  6.  Underlying anxiety depression.  Home dose Cymbalta.  7.  Hypertension.  Placed on Coreg + PRN Catapres will monitor.  8.  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  Supportive care.  9.  Right arm pain at the site of previous IV, IV removed.  Check venous ultrasound to rule out a DVT, warm compress, aspirin, prophylactic dose Lovenox and monitor.       Condition - Extremely Guarded  Family Communication  :  wife Mardene Celeste [phone]  on 01/20/22, 01/21/22, 01/23/22  Code Status :  Full  Consults  :  Neuro  PUD Prophylaxis :    Procedures  :     R arm Ven. [place] -  CT - Non acute   MRI  - 1. No evidence of acute intracranial abnormality on this mildly motion limited study. 2. Mild cerebral atrophy  TTE- 1. Left ventricular ejection fraction, by estimation, is 60 to 65%. The left ventricle  has normal function. The left ventricle has no regional wall motion abnormalities. Left ventricular diastolic parameters are consistent with Grade I diastolic dysfunction (impaired relaxation).  2. Right ventricular systolic function is normal. The right ventricular size is normal.  3. The mitral valve is normal in structure. Trivial mitral valve regurgitation.  4. The aortic valve is tricuspid. There is mild calcification of the aortic valve. There is mild thickening of the aortic valve. Aortic valve regurgitation is mild.  5. The inferior vena cava is normal in size with greater than 50% respiratory variability, suggesting right atrial pressure of 3 mmHg.  EEG - Non acute      Disposition Plan  :    Status is: Inp  DVT Prophylaxis  :    enoxaparin (LOVENOX) injection 40 mg Start: 01/20/22 1000  Lab Results  Component Value Date   PLT 216 01/24/2022    Diet :  Diet Order             Diet Heart Room service appropriate? Yes; Fluid consistency: Thin  Diet effective now                    Inpatient Medications  Scheduled Meds:  aspirin  325 mg Oral Daily   carvedilol  6.25 mg Oral BID WC   chlordiazePOXIDE  15 mg Oral TID   enoxaparin (LOVENOX) injection  40 mg Subcutaneous Q24H   feeding supplement  237 mL Oral BID BM   folic acid  1 mg Oral Daily   multivitamin with minerals  1 tablet Oral Daily   nicotine  21 mg Transdermal QHS   thiamine  100 mg Oral Daily   Or   thiamine  100 mg Intravenous Daily   Continuous Infusions:   PRN Meds:.acetaminophen **OR** acetaminophen, cloNIDine, haloperidol lactate  Antibiotics  :    Anti-infectives (From admission, onward)    None        Time Spent in minutes  30   Lala Lund M.D on 01/24/2022 at 9:25 AM  To page go to [email or web address]   [facility] -  Office  [phone]  See all Orders from today for further details    Objective:   Vitals:   01/23/22 2000 01/24/22 0000 01/24/22 0400 01/24/22  0728  BP: 135/67 124/63 115/61 (!) 145/85  Pulse: 71 65 68 82  Resp: 20 18 18 20   Temp:  98.1 F (36.7 C) 98.4 F (36.9 C) 98.2 F (36.8 C)  TempSrc:  Oral Oral Oral  SpO2: 96% 96% 94% 93%  Weight:      Height:        Wt Readings from Last 3 Encounters:  01/23/22 76 kg  10/27/21 73.9 kg  10/13/21 76.2 kg     Intake/Output Summary (Last 24 hours) at 01/24/2022 0925 Last data filed at 01/24/2022 0830 Gross per 24 hour  Intake 1672.35 ml  Output 375 ml  Net 1297.35 ml     Physical Exam  Awake Alert, No new F.N deficits, Normal affect [place].AT,PERRAL Supple Neck, No JVD,   Symmetrical Chest wall movement, Good air movement bilaterally, CTAB RRR,No Gallops, Rubs or new Murmurs,  +ve B.Sounds, Abd Soft, No tenderness,    R-BKA, R.ARM mild redness and swelling at the previous IV site      Data Review:    CBC Recent Labs  Lab 01/20/22 0238 01/21/22 0717 01/22/22 0356 01/23/22 0458 01/24/22 0351  WBC 13.8* 12.2* 6.7 8.0 9.0  HGB 15.3 15.1 13.9 13.5 13.7  HCT 45.2 43.8 40.6 39.9 40.6  PLT 233 227 206 205 216  MCV 100.2* 100.2* 100.0 102.6* 101.8*  MCH 33.9 34.6* 34.2* 34.7* 34.3*  MCHC 33.8 34.5 34.2 33.8 33.7  RDW 13.3 12.8 12.7 12.8 12.7  LYMPHSABS  --  1.9 2.1 2.1 2.7  MONOABS  --  1.0 0.6 0.7 0.7  EOSABS  --  0.1 0.2 0.3 0.2  BASOSABS  --  0.0 0.0 0.0 0.1    Electrolytes Recent Labs  Lab 01/19/22 1728 01/19/22 1756 01/20/22 0238 01/21/22 0717 01/21/22 0719 01/22/22 0356 01/23/22 0458 01/24/22 0351  NA 134*  --   --  138  --  139 141 139  K 4.9  --   --  4.0  --  3.6 3.7 3.8  CL 102  --   --  104  --  104 108 107  CO2 23  --   --  22  --  27 25 24   GLUCOSE 137*  --   --  93  --  111* 134* 111*  BUN 14  --   --  18  --  17 15 13   CREATININE 0.88  --   --  1.02  --  0.87 0.79 0.80  CALCIUM 9.1  --   --  9.2  --  8.7* 8.4* 8.7*  AST 40  --   --  53*  --  40 43* 36  ALT 26  --   --  36  --  32 44 47*  ALKPHOS 73  --   --  73  --  64 61 72   BILITOT 2.2*  --   --  1.6*  --  0.8  0.5 0.5  ALBUMIN 3.7  --   --  3.6  --  3.0* 2.9* 3.0*  MG  --   --   --  1.9  --  2.2 2.0 2.3  PROCALCITON  --   --  0.12 <0.10  --  <0.10  --   --   AMMONIA  --  28  --   --   --   --   --   --   BNP  --   --   --   --  33.9 19.1 21.1 31.4    ------------------------------------------------------------------------------------------------------------------ No results for input(s): CHOL, HDL, LDLCALC, TRIG, CHOLHDL, LDLDIRECT in the last 72 hours.  No results found for: HGBA1C  No results for input(s): TSH, T4TOTAL, T3FREE, THYROIDAB in the last 72 hours.  Invalid input(s): FREET3 ------------------------------------------------------------------------------------------------------------------ ID Labs Recent Labs  Lab 01/19/22 1728 01/20/22 0238 01/21/22 0717 01/22/22 0356 01/23/22 0458 01/24/22 0351  WBC  --  13.8* 12.2* 6.7 8.0 9.0  PLT  --  233 227 206 205 216  PROCALCITON  --  0.12 <0.10 <0.10  --   --   CREATININE 0.88  --  1.02 0.87 0.79 0.80   Cardiac Enzymes No results for input(s): CKMB, TROPONINI, MYOGLOBIN in the last 168 hours.  Invalid input(s): CK   Radiology Reports MR BRAIN WO CONTRAST  Result Date: 01/20/2022 CLINICAL DATA:  Seizure disorder, clinical change EXAM: MRI HEAD WITHOUT CONTRAST TECHNIQUE: Multiplanar, multiecho pulse sequences of the brain and surrounding structures were obtained without intravenous contrast. COMPARISON:  CT head January 19, 2022.  MRI 12/21/2017. FINDINGS: Brain: No acute infarction, hemorrhage, hydrocephalus, extra-axial collection or mass lesion. Mild scattered T2/FLAIR hyperintensities in the white matter, nonspecific but compatible with chronic microvascular ischemic disease that is very mild for patient age. Mild atrophy. Vascular: Major arterial flow voids are maintained skull base. Skull and upper cervical spine: Normal marrow signal. Sinuses/Orbits: Negative. Other: No mastoid  effusions IMPRESSION: 1. No evidence of acute intracranial abnormality on this mildly motion limited study. 2. Mild cerebral atrophy (ICD10-G31.9). Electronically Signed   By: Margaretha Sheffield M.D.   On: 01/20/2022 10:39   EEG adult  Result Date: 01/21/2022 Greta Doom, MD     01/21/2022 10:52 AM History: 71 yo M being evaluated for syncope Sedation: none Technique: This EEG was acquired with electrodes placed according to the International 10-20 electrode system (including Fp1, Fp2, F3, F4, C3, C4, P3, P4, O1, O2, T3, T4, T5, T6, A1, A2, Fz, Cz, Pz). The following electrodes were missing or displaced: none. Background: The background consists of intermixed alpha and beta activities. There is a well defined posterior dominant rhythm of 9 Hz that attenuates with eye opening. Sleep is not recorded. Photic stimulation: Physiologic driving is not performed EEG Abnormalities: none Clinical Interpretation: This normal EEG is recorded in the waking state. There was no seizure or seizure predisposition recorded on this study. Please note that lack of epileptiform activity on EEG does not preclude the possibility of epilepsy. Roland Rack, MD [facility] [phone] If 7pm- 7am, please page neurology on call as listed in [place].   ECHOCARDIOGRAM COMPLETE  Result Date: 01/20/2022    ECHOCARDIOGRAM REPORT   Patient Name:   Austin Dillon Date of Exam: 01/20/2022 Medical Rec #:  [id number]       Height:       68.0 in Accession #:    [id number]      Weight:  170.0 lb Date of Birth:  1951-08-29      BSA:          1.907 m Patient Age:    27 years        BP:           149/76 mmHg Patient Gender: M               HR:           75 bpm. Exam Location:  Inpatient Procedure: 2D Echo, Cardiac Doppler and Color Doppler Indications:    Syncope  History:        Patient has no prior history of Echocardiogram examinations.  Sonographer:    Jyl Heinz Referring Phys: [id number] [place]  1. Left ventricular ejection fraction, by estimation, is 60 to 65%. The left ventricle has normal function. The left ventricle has no regional wall motion abnormalities. Left ventricular diastolic parameters are consistent with Grade I diastolic dysfunction (impaired relaxation).  2. Right ventricular systolic function is normal. The right ventricular size is normal.  3. The mitral valve is normal in structure. Trivial mitral valve regurgitation.  4. The aortic valve is tricuspid. There is mild calcification of the aortic valve. There is mild thickening of the aortic valve. Aortic valve regurgitation is mild.  5. The inferior vena cava is normal in size with greater than 50% respiratory variability, suggesting right atrial pressure of 3 mmHg. Comparison(s): No prior Echocardiogram. FINDINGS  Left Ventricle: Left ventricular ejection fraction, by estimation, is 60 to 65%. The left ventricle has normal function. The left ventricle has no regional wall motion abnormalities. The left ventricular internal cavity size was normal in size. There is  no left ventricular hypertrophy. Left ventricular diastolic parameters are consistent with Grade I diastolic dysfunction (impaired relaxation). Right Ventricle: The right ventricular size is normal. No increase in right ventricular wall thickness. Right ventricular systolic function is normal. Left Atrium: Left atrial size was normal in size. Right Atrium: Right atrial size was normal in size. Pericardium: There is no evidence of pericardial effusion. Mitral Valve: The mitral valve is normal in structure. Mild mitral annular calcification. Trivial mitral valve regurgitation. Tricuspid Valve: The tricuspid valve is normal in structure. Tricuspid valve regurgitation is trivial. Aortic Valve: The aortic valve is tricuspid. There is mild calcification of the aortic valve. There is mild thickening of the aortic valve. Aortic valve regurgitation is mild. Aortic  regurgitation PHT measures 595 msec. Aortic valve peak gradient measures 9.4 mmHg. Pulmonic Valve: The pulmonic valve was normal in structure. Pulmonic valve regurgitation is trivial. Aorta: The aortic root and ascending aorta are structurally normal, with no evidence of dilitation. Venous: The inferior vena cava is normal in size with greater than 50% respiratory variability, suggesting right atrial pressure of 3 mmHg. IAS/Shunts: The atrial septum is grossly normal.  LEFT VENTRICLE PLAX 2D LVIDd:         4.30 cm      Diastology LVIDs:         3.00 cm      LV e' medial:    7.46 cm/s LV PW:         1.00 cm      LV E/e' medial:  8.0 LV IVS:        1.10 cm      LV e' lateral:   9.17 cm/s LVOT diam:     2.00 cm      LV E/e' lateral: 6.5 LV SV:  90 LV SV Index:   47 LVOT Area:     3.14 cm  LV Volumes (MOD) LV vol d, MOD A2C: 92.5 ml LV vol d, MOD A4C: 103.0 ml LV vol s, MOD A2C: 36.0 ml LV vol s, MOD A4C: 37.2 ml LV SV MOD A2C:     56.5 ml LV SV MOD A4C:     103.0 ml LV SV MOD BP:      64.8 ml RIGHT VENTRICLE             IVC RV Basal diam:  3.10 cm     IVC diam: 1.70 cm RV Mid diam:    2.70 cm RV S prime:     11.80 cm/s TAPSE (M-mode): 2.2 cm LEFT ATRIUM             Index        RIGHT ATRIUM           Index LA diam:        3.70 cm 1.94 cm/m   RA Area:     12.90 cm LA Vol (A2C):   40.6 ml 21.29 ml/m  RA Volume:   28.60 ml  14.99 ml/m LA Vol (A4C):   39.2 ml 20.55 ml/m LA Biplane Vol: 40.0 ml 20.97 ml/m  AORTIC VALVE AV Area (Vmax): 2.75 cm AV Vmax:        153.00 cm/s AV Peak Grad:   9.4 mmHg LVOT Vmax:      134.00 cm/s LVOT Vmean:     107.000 cm/s LVOT VTI:       0.285 m AI PHT:         595 msec  AORTA Ao Root diam: 3.30 cm Ao Asc diam:  3.20 cm MITRAL VALVE MV Area (PHT): 2.75 cm    SHUNTS MV Decel Time: 276 msec    Systemic VTI:  0.29 m MV E velocity: 59.60 cm/s  Systemic Diam: 2.00 cm MV A velocity: 67.30 cm/s MV E/A ratio:  0.89 Gwyndolyn Kaufman MD Electronically signed by Gwyndolyn Kaufman MD  Signature Date/Time: 01/20/2022/1:12:27 PM    Final

## 2022-01-24 NOTE — Progress Notes (Signed)
TR[facility] night coverage note:  Pt seen and evaluated, now having more moderate-severe erythema and swelling at site of previous IV in R arm.  Persists despite compress.  Looks cellulitic to me at time of my evaluation.  [place] ordered to r/o DVT at 9am today wasn't performed unfortunately.  DDx: cellulitis vs thrombus 1) pt remains on DVT ppx lovenox 2) [place] remains ordered 3) will start empiric keflex at this point since erythema seems to be persistent / possibly worsening.

## 2022-01-24 NOTE — Plan of Care (Signed)
°  Problem: Education: Goal: Knowledge of disease or condition will improve Outcome: Progressing Goal: Understanding of discharge needs will improve Outcome: Progressing   Problem: Health Behavior/Discharge Planning: Goal: Ability to identify changes in lifestyle to reduce recurrence of condition will improve Outcome: Progressing Goal: Identification of resources available to assist in meeting health care needs will improve Outcome: Progressing   Problem: Physical Regulation: Goal: Complications related to the disease process, condition or treatment will be avoided or minimized Outcome: Progressing   Problem: Safety: Goal: Ability to remain free from injury will improve Outcome: Progressing   Problem: Education: Goal: Knowledge of General Education information will improve Description: Including pain rating scale, medication(s)/side effects and non-pharmacologic comfort measures Outcome: Progressing   Problem: Health Behavior/Discharge Planning: Goal: Ability to manage health-related needs will improve Outcome: Progressing   Problem: Clinical Measurements: Goal: Ability to maintain clinical measurements within normal limits will improve Outcome: Progressing Goal: Will remain free from infection Outcome: Progressing Goal: Diagnostic test results will improve Outcome: Progressing Goal: Respiratory complications will improve Outcome: Progressing Goal: Cardiovascular complication will be avoided Outcome: Progressing   Problem: Activity: Goal: Risk for activity intolerance will decrease Outcome: Progressing   Problem: Nutrition: Goal: Adequate nutrition will be maintained Outcome: Progressing   Problem: Coping: Goal: Level of anxiety will decrease Outcome: Progressing   Problem: Elimination: Goal: Will not experience complications related to bowel motility Outcome: Progressing Goal: Will not experience complications related to urinary retention Outcome:  Progressing   Problem: Pain Managment: Goal: General experience of comfort will improve Outcome: Progressing   Problem: Safety: Goal: Ability to remain free from injury will improve Outcome: Progressing   Problem: Skin Integrity: Goal: Risk for impaired skin integrity will decrease Outcome: Progressing   Problem: Safety: Goal: Non-violent Restraint(s) Outcome: Progressing

## 2022-01-25 ENCOUNTER — Inpatient Hospital Stay (HOSPITAL_COMMUNITY): Payer: No Typology Code available for payment source

## 2022-01-25 DIAGNOSIS — M7989 Other specified soft tissue disorders: Secondary | ICD-10-CM | POA: Diagnosis not present

## 2022-01-25 LAB — BASIC METABOLIC PANEL
Anion gap: 8 (ref 5–15)
BUN: 14 mg/dL (ref 8–23)
CO2: 26 mmol/L (ref 22–32)
Calcium: 8.8 mg/dL — ABNORMAL LOW (ref 8.9–10.3)
Chloride: 106 mmol/L (ref 98–111)
Creatinine, Ser: 0.8 mg/dL (ref 0.61–1.24)
GFR, Estimated: >60 mL/min (ref >60)
Glucose, Bld: 103 mg/dL — ABNORMAL HIGH (ref 70–99)
Potassium: 4 mmol/L (ref 3.5–5.1)
Sodium: 140 mmol/L (ref 135–145)

## 2022-01-25 LAB — CBC WITH DIFFERENTIAL/PLATELET
Abs Immature Granulocytes: 0.04 10*3/uL (ref 0.00–0.07)
Basophils Absolute: 0 10*3/uL (ref 0.0–0.1)
Basophils Relative: 0 %
Eosinophils Absolute: 0.2 10*3/uL (ref 0.0–0.5)
Eosinophils Relative: 2 %
HCT: 40.5 % (ref 39.0–52.0)
Hemoglobin: 13.3 g/dL (ref 13.0–17.0)
Immature Granulocytes: 1 %
Lymphocytes Relative: 29 %
Lymphs Abs: 2.4 10*3/uL (ref 0.7–4.0)
MCH: 33.9 pg (ref 26.0–34.0)
MCHC: 32.8 g/dL (ref 30.0–36.0)
MCV: 103.3 fL — ABNORMAL HIGH (ref 80.0–100.0)
Monocytes Absolute: 0.7 10*3/uL (ref 0.1–1.0)
Monocytes Relative: 9 %
Neutro Abs: 4.9 10*3/uL (ref 1.7–7.7)
Neutrophils Relative %: 59 %
Platelets: 226 10*3/uL (ref 150–400)
RBC: 3.92 MIL/uL — ABNORMAL LOW (ref 4.22–5.81)
RDW: 12.6 % (ref 11.5–15.5)
WBC: 8.2 10*3/uL (ref 4.0–10.5)
nRBC: 0 % (ref 0.0–0.2)

## 2022-01-25 LAB — C-REACTIVE PROTEIN: CRP: 0.6 mg/dL (ref <1.0)

## 2022-01-25 LAB — PROCALCITONIN: Procalcitonin: <0.1 ng/mL

## 2022-01-25 MED ORDER — CHLORDIAZEPOXIDE HCL 5 MG PO CAPS
5.0000 mg | ORAL_CAPSULE | Freq: Three times a day (TID) | ORAL | Status: DC
  Administered 2022-01-25 – 2022-01-26 (×3): 5 mg via ORAL
  Filled 2022-01-25 (×3): qty 1

## 2022-01-25 NOTE — Progress Notes (Signed)
Physical Therapy Treatment Patient Details Name: Austin Dillon MRN: [id number] DOB: [birth year] Today's Date: 01/25/2022   History of Present Illness The pt is a 71 yo male presenting 2/15 after a syncopal episode at home. Upon work up, pt with acute metabolic encephalopathy with possible vasovagal syncope. PMH includes: alzheimer's dementia, R BKA, anxiety, depression, PTSD, and alcohol abuse.    PT Comments    Pt progressing well with mobility. Likely getting close to baseline and will likely dc from PT services soon.    Recommendations for follow up therapy are one component of a multi-disciplinary discharge planning process, led by the attending physician.  Recommendations may be updated based on patient status, additional functional criteria and insurance authorization.  Follow Up Recommendations  Long-term institutional care without follow-up therapy     Assistance Recommended at Discharge Intermittent Supervision/Assistance  Patient can return home with the following Direct supervision/assist for medications management;Direct supervision/assist for financial management   Equipment Recommendations  None recommended by PT    Recommendations for Other Services       Precautions / Restrictions Precautions Precautions: Fall Precaution Comments: R prosthetic leg Restrictions Weight Bearing Restrictions: No     Mobility  Bed Mobility Overal bed mobility: Modified Independent                  Transfers Overall transfer level: Modified independent Equipment used: Rolling walker (2 wheels) Transfers: Sit to/from Stand Sit to Stand: Modified independent (Device/Increase time)                Ambulation/Gait Ambulation/Gait assistance: Supervision Gait Distance (Feet): 250 Feet Assistive device: Rolling walker (2 wheels) Gait Pattern/deviations: Step-through pattern Gait velocity: decr Gait velocity interpretation: 1.31 - 2.62 ft/sec, indicative of limited  community ambulator   General Gait Details: supervision for safety and lines   Stairs             Wheelchair Mobility    Modified Rankin (Stroke Patients Only)       Balance Overall balance assessment: Mild deficits observed, not formally tested                                          Cognition Arousal/Alertness: Awake/alert Behavior During Therapy: WFL for tasks assessed/performed Overall Cognitive Status: History of cognitive impairments - at baseline                                 General Comments: pt with hx of dementia, pleasant today but no recall of events from yesterday        Exercises      General Comments        Pertinent Vitals/Pain Pain Assessment Pain Assessment: No/denies pain    Home Living                          Prior Function            PT Goals (current goals can now be found in the care plan section) Acute Rehab PT Goals Patient Stated Goal: to maintain independence with gait Progress towards PT goals: Progressing toward goals    Frequency    Min 2X/week      PT Plan Current plan remains appropriate    Co-evaluation  AM-PAC PT "6 Clicks" Mobility   Outcome Measure  Help needed turning from your back to your side while in a flat bed without using bedrails?: None Help needed moving from lying on your back to sitting on the side of a flat bed without using bedrails?: None Help needed moving to and from a bed to a chair (including a wheelchair)?: None Help needed standing up from a chair using your arms (e.g., wheelchair or bedside chair)?: None Help needed to walk in hospital room?: A Little Help needed climbing 3-5 steps with a railing? : A Little 6 Click Score: 22    End of Session   Activity Tolerance: Patient tolerated treatment well Patient left: in chair;with call bell/phone within reach;with chair alarm set Nurse Communication: Mobility status PT  Visit Diagnosis: Other abnormalities of gait and mobility (R26.89)     Time: 3568-6168 PT Time Calculation (min) (ACUTE ONLY): 17 min  Charges:  $Gait Training: 8-22 mins                     [place] Pager [phone] [place] 01/25/2022, 1:07 PM

## 2022-01-25 NOTE — H&P (Deleted)
CSW spoke with pt about DC to a [facility]. Pt stated he wants to DC home. Pt wife has received resources from [place] and the [place] [employer], museum. Pt is oriented, pt wife can go to a dv shelter which has been suggested by myself and [facility] CSW. Although he may have a chance at a [place] memory care facility he is not IVC'ed and refused to go.

## 2022-01-25 NOTE — Progress Notes (Signed)
Vascular US tech finished with R brachial arm [place]. Vascular tech notified RN that patient has a superficial hematoma of the R basilic vein. MD paged and notified. MD ordered for hot compresses and to keep R arm elevated. RN applied hot compresses and elevated R arm per MD order.

## 2022-01-25 NOTE — Social Work (Signed)
CSW spoke with pt about DC to a [facility] facility for D[facility] Alzheimer. Pt stated he wants to DC home. Pt wife has received resources from [place] and the [place] [employer] officer, museum. Pt is oriented, pt wife can go to a dv shelter which has been suggested by myself and [facility] CSW. Although he may have a chance at a [place] memory care facility he is not IVC'ed and refused to go.

## 2022-01-25 NOTE — Progress Notes (Signed)
Upper extremity venous has been completed.   Preliminary results in CV Proc.   Jinny Blossom Dannielle Baskins 01/25/2022 11:31 AM

## 2022-01-25 NOTE — Progress Notes (Addendum)
PROGRESS NOTE                                                                                                                                                                                                             Patient Demographics:    Austin Dillon, is a 71 y.o. male, DOB - Sep 15, 1951, UJW:[id number]  Outpatient Primary MD for the patient is Baxter Hire, MD    LOS - 5  Admit date - 01/19/2022    Chief Complaint  Patient presents with   Fall       Brief Narrative (HPI from H&P)  -  Austin Dillon is a 71 y.o. 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  EMS reported lethargy, poor appetite, and diarrhea.  He had 1 episode of vomiting in route and was given a 250 cc fluid bolus.  Wife reported to ED provider that patient was sitting on the toilet having a bowel movement when all of a sudden he lost consciousness and fell off the commode onto his left side.  Patient complained of left shoulder pain.  Wife reported that patient had 4-5 minutes of jerking with his eyes rolling back in his head.  He also apparently intermittently drinks a lot of alcohol, he had about 18 cans of beer 2 days before this episode, according to the wife for the last 2 weeks he has been drinking on a daily basis, he had a history of alcohol abuse but had quit for a long time and has recently resumed drinking.  He did not drink any alcohol yesterday.  He was admitted here for seizure-like activity and syncope work-up.   Subjective:   Patient in bed, appears comfortable, denies any headache, no fever, no chest pain or pressure, no shortness of breath , no abdominal pain. No focal weakness.  R arm pain.   Assessment  & Plan :    Acute metabolic encephalopathy with  possible vasovagal  syncope versus seizure - in setting of recent heavy alcohol use but none on the day of this episode.  CT of the head and MRI brain unremarkable, EEG non acute.  Was seen by neurology this episode most likely due to  alcohol induced seizures.  Stable here.  2.  Ongoing heavy alcohol abuse.  Went into full-blown DTs on 01/21/2022, now gradually improving after being placed on scheduled high-dose Librium - taper continued he was on 25 mg 3 times daily now down to 5 mg 3 times daily, CIWA protocol , PRN Haldol , stopped IVF on 01/23/22, continue to monitor counseled to quit alcohol.  3.  Alzheimer's dementia.  At risk for delirium.  Minimize narcotics and benzodiazepines as much as possible, for now requiring CIWA protocol.  Monitor.  4.  Reported diarrhea prior to coming here.  None in the hospital.  GI pathogen panel ordered monitor with supportive care.  Abdominal exam is benign.  This problem has resolved.  5.  Reactionary leukocytosis due to atelectasis.  Improving monitor.  Abdominal x-ray unremarkable, chest x-ray has bilateral atelectasis will monitor for any signs of pneumonia, none thus far.  Add incentive spirometer.  This problem has resolved.  6.  Underlying anxiety depression.  Home dose Cymbalta.  7.  Hypertension.  Placed on Coreg + PRN Catapres will monitor.  8.  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  Supportive care.  9.  Right arm pain at the site of previous IV, IV removed.  Check venous ultrasound to rule out a DVT, warm compress, aspirin, prophylactic dose Lovenox and monitor.  Keflex added by night team for suspicion for mild cellulitis.  For now continue trend CRP procalcitonin..  No fever or leukocytosis.       Condition - Extremely Guarded  Family Communication  :  wife Mardene Celeste [phone]  on 01/20/22, 01/21/22, 01/23/22  Code Status :  Full  Consults  :  Neuro  PUD Prophylaxis :    Procedures  :     R arm Ven. [place] -  CT - Non acute   MRI  - 1. No  evidence of acute intracranial abnormality on this mildly motion limited study. 2. Mild cerebral atrophy  TTE- 1. Left ventricular ejection fraction, by estimation, is 60 to 65%. The left ventricle has normal function. The left ventricle has no regional wall motion abnormalities. Left ventricular diastolic parameters are consistent with Grade I diastolic dysfunction (impaired relaxation).  2. Right ventricular systolic function is normal. The right ventricular size is normal.  3. The mitral valve is normal in structure. Trivial mitral valve regurgitation.  4. The aortic valve is tricuspid. There is mild calcification of the aortic valve. There is mild thickening of the aortic valve. Aortic valve regurgitation is mild.  5. The inferior vena cava is normal in size with greater than 50% respiratory variability, suggesting right atrial pressure of 3 mmHg.  EEG - Non acute      Disposition Plan  :    Status is: Inp  DVT Prophylaxis  :    enoxaparin (LOVENOX) injection 40 mg Start: 01/20/22 1000  Lab Results  Component Value Date   PLT 226 01/25/2022    Diet :  Diet Order             Diet Heart Room service appropriate? Yes; Fluid consistency: Thin  Diet effective now                    Inpatient Medications  Scheduled Meds:  aspirin  325 mg Oral Daily   carvedilol  6.25 mg Oral BID WC   cephALEXin  500 mg Oral Q6H   chlordiazePOXIDE  5 mg Oral TID   enoxaparin (LOVENOX) injection  40 mg Subcutaneous  Q24H   feeding supplement  237 mL Oral BID BM   folic acid  1 mg Oral Daily   multivitamin with minerals  1 tablet Oral Daily   nicotine  21 mg Transdermal QHS   thiamine  100 mg Oral Daily   Or   thiamine  100 mg Intravenous Daily   Continuous Infusions:   PRN Meds:.acetaminophen **OR** acetaminophen, cloNIDine, haloperidol lactate  Antibiotics  :    Anti-infectives (From admission, onward)    Start     Dose/Rate Route Frequency Ordered Stop   01/25/22 0015   cephALEXin (KEFLEX) capsule 500 mg        500 mg Oral Every 6 hours 01/24/22 2327 01/29/22 2359        Time Spent in minutes  30   Lala Lund M.D on 01/25/2022 at 9:11 AM  To page go to [email or web address]   [facility] -  Office  [phone]  See all Orders from today for further details    Objective:   Vitals:   01/25/22 0000 01/25/22 0400 01/25/22 0519 01/25/22 0753  BP: 124/67 125/63  (!) 158/89  Pulse: 67 68  74  Resp: 17 19  20   Temp: 98 F (36.7 C) (!) 97.4 F (36.3 C)  97.6 F (36.4 C)  TempSrc: Oral Oral  Oral  SpO2: 94% 100%  95%  Weight:   75.4 kg   Height:        Wt Readings from Last 3 Encounters:  01/25/22 75.4 kg  10/27/21 73.9 kg  10/13/21 76.2 kg     Intake/Output Summary (Last 24 hours) at 01/25/2022 0911 Last data filed at 01/25/2022 4010 Gross per 24 hour  Intake 747 ml  Output 500 ml  Net 247 ml     Physical Exam  Awake Alert, No new F.N deficits, Normal affect [place].AT,PERRAL Supple Neck, No JVD,   Symmetrical Chest wall movement, Good air movement bilaterally, CTAB RRR,No Gallops, Rubs or new Murmurs,  +ve B.Sounds, Abd Soft, No tenderness,    R-BKA, R.ARM mild redness and swelling at the previous IV site    Data Review:    CBC Recent Labs  Lab 01/21/22 0717 01/22/22 0356 01/23/22 0458 01/24/22 0351 01/25/22 0230  WBC 12.2* 6.7 8.0 9.0 8.2  HGB 15.1 13.9 13.5 13.7 13.3  HCT 43.8 40.6 39.9 40.6 40.5  PLT 227 206 205 216 226  MCV 100.2* 100.0 102.6* 101.8* 103.3*  MCH 34.6* 34.2* 34.7* 34.3* 33.9  MCHC 34.5 34.2 33.8 33.7 32.8  RDW 12.8 12.7 12.8 12.7 12.6  LYMPHSABS 1.9 2.1 2.1 2.7 2.4  MONOABS 1.0 0.6 0.7 0.7 0.7  EOSABS 0.1 0.2 0.3 0.2 0.2  BASOSABS 0.0 0.0 0.0 0.1 0.0    Electrolytes Recent Labs  Lab 01/19/22 1728 01/19/22 1756 01/20/22 0238 01/21/22 0717 01/21/22 0719 01/22/22 0356 01/23/22 0458 01/24/22 0351 01/25/22 0230  NA 134*  --   --  138  --  139 141 139 140  K 4.9  --   --  4.0  --   3.6 3.7 3.8 4.0  CL 102  --   --  104  --  104 108 107 106  CO2 23  --   --  22  --  27 25 24 26   GLUCOSE 137*  --   --  93  --  111* 134* 111* 103*  BUN 14  --   --  18  --  17 15 13 14   CREATININE 0.88  --   --  1.02  --  0.87 0.79 0.80 0.80  CALCIUM 9.1  --   --  9.2  --  8.7* 8.4* 8.7* 8.8*  AST 40  --   --  53*  --  40 43* 36  --   ALT 26  --   --  36  --  32 44 47*  --   ALKPHOS 73  --   --  73  --  64 61 72  --   BILITOT 2.2*  --   --  1.6*  --  0.8 0.5 0.5  --   ALBUMIN 3.7  --   --  3.6  --  3.0* 2.9* 3.0*  --   MG  --   --   --  1.9  --  2.2 2.0 2.3  --   PROCALCITON  --   --  0.12 <0.10  --  <0.10  --   --   --   AMMONIA  --  28  --   --   --   --   --   --   --   BNP  --   --   --   --  33.9 19.1 21.1 31.4  --     ------------------------------------------------------------------------------------------------------------------ No results for input(s): CHOL, HDL, LDLCALC, TRIG, CHOLHDL, LDLDIRECT in the last 72 hours.  No results found for: HGBA1C  No results for input(s): TSH, T4TOTAL, T3FREE, THYROIDAB in the last 72 hours.  Invalid input(s): FREET3 ------------------------------------------------------------------------------------------------------------------ ID Labs Recent Labs  Lab 01/20/22 0238 01/21/22 0717 01/22/22 0356 01/23/22 0458 01/24/22 0351 01/25/22 0230  WBC 13.8* 12.2* 6.7 8.0 9.0 8.2  PLT 233 227 206 205 216 226  PROCALCITON 0.12 <0.10 <0.10  --   --   --   CREATININE  --  1.02 0.87 0.79 0.80 0.80   Cardiac Enzymes No results for input(s): CKMB, TROPONINI, MYOGLOBIN in the last 168 hours.  Invalid input(s): CK   Radiology Reports EEG adult  Result Date: 01/21/2022 Greta Doom, MD     01/21/2022 10:52 AM History: 71 yo M being evaluated for syncope Sedation: none Technique: This EEG was acquired with electrodes placed according to the International 10-20 electrode system (including Fp1, Fp2, F3, F4, C3, C4, P3, P4, O1, O2, T3,  T4, T5, T6, A1, A2, Fz, Cz, Pz). The following electrodes were missing or displaced: none. Background: The background consists of intermixed alpha and beta activities. There is a well defined posterior dominant rhythm of 9 Hz that attenuates with eye opening. Sleep is not recorded. Photic stimulation: Physiologic driving is not performed EEG Abnormalities: none Clinical Interpretation: This normal EEG is recorded in the waking state. There was no seizure or seizure predisposition recorded on this study. Please note that lack of epileptiform activity on EEG does not preclude the possibility of epilepsy. Roland Rack, MD [facility] [phone] If 7pm- 7am, please page neurology on call as listed in [place].

## 2022-01-26 DIAGNOSIS — Z89511 Acquired absence of right leg below knee: Secondary | ICD-10-CM

## 2022-01-26 DIAGNOSIS — L0390 Cellulitis, unspecified: Secondary | ICD-10-CM

## 2022-01-26 LAB — CBC WITH DIFFERENTIAL/PLATELET
Abs Immature Granulocytes: 0.05 10*3/uL (ref 0.00–0.07)
Basophils Absolute: 0 10*3/uL (ref 0.0–0.1)
Basophils Relative: 0 %
Eosinophils Absolute: 0.2 10*3/uL (ref 0.0–0.5)
Eosinophils Relative: 3 %
HCT: 41.3 % (ref 39.0–52.0)
Hemoglobin: 14 g/dL (ref 13.0–17.0)
Immature Granulocytes: 1 %
Lymphocytes Relative: 25 %
Lymphs Abs: 2.3 10*3/uL (ref 0.7–4.0)
MCH: 34.2 pg — ABNORMAL HIGH (ref 26.0–34.0)
MCHC: 33.9 g/dL (ref 30.0–36.0)
MCV: 101 fL — ABNORMAL HIGH (ref 80.0–100.0)
Monocytes Absolute: 0.8 10*3/uL (ref 0.1–1.0)
Monocytes Relative: 8 %
Neutro Abs: 5.8 10*3/uL (ref 1.7–7.7)
Neutrophils Relative %: 63 %
Platelets: 246 10*3/uL (ref 150–400)
RBC: 4.09 MIL/uL — ABNORMAL LOW (ref 4.22–5.81)
RDW: 12.4 % (ref 11.5–15.5)
WBC: 9.2 10*3/uL (ref 4.0–10.5)
nRBC: 0 % (ref 0.0–0.2)

## 2022-01-26 LAB — PROCALCITONIN: Procalcitonin: 0.1 ng/mL

## 2022-01-26 LAB — BASIC METABOLIC PANEL
Anion gap: 9 (ref 5–15)
BUN: 14 mg/dL (ref 8–23)
CO2: 25 mmol/L (ref 22–32)
Calcium: 9.1 mg/dL (ref 8.9–10.3)
Chloride: 105 mmol/L (ref 98–111)
Creatinine, Ser: 0.89 mg/dL (ref 0.61–1.24)
GFR, Estimated: >60 mL/min (ref >60)
Glucose, Bld: 115 mg/dL — ABNORMAL HIGH (ref 70–99)
Potassium: 4.1 mmol/L (ref 3.5–5.1)
Sodium: 139 mmol/L (ref 135–145)

## 2022-01-26 LAB — C-REACTIVE PROTEIN: CRP: 0.6 mg/dL (ref <1.0)

## 2022-01-26 MED ORDER — DULOXETINE HCL 30 MG PO CPEP
30.0000 mg | ORAL_CAPSULE | Freq: Every day | ORAL | Status: DC
  Administered 2022-01-26 – 2022-01-27 (×2): 30 mg via ORAL
  Filled 2022-01-26 (×2): qty 1

## 2022-01-26 MED ORDER — ENSURE ENLIVE PO LIQD: 237.0000 mL | ORAL | Status: DC

## 2022-01-26 MED ORDER — CEFAZOLIN SODIUM-DEXTROSE 1-4 GM/50ML-% IV SOLN
1.0000 g | Freq: Three times a day (TID) | INTRAVENOUS | Status: DC
  Administered 2022-01-26 – 2022-01-27 (×4): 1 g via INTRAVENOUS
  Filled 2022-01-26 (×6): qty 50

## 2022-01-26 MED ORDER — CHLORDIAZEPOXIDE HCL 5 MG PO CAPS
5.0000 mg | ORAL_CAPSULE | Freq: Two times a day (BID) | ORAL | Status: DC
  Administered 2022-01-26 – 2022-01-27 (×2): 5 mg via ORAL
  Filled 2022-01-26 (×2): qty 1

## 2022-01-26 NOTE — Progress Notes (Signed)
OT Cancellation Note  Patient Details Name: Austin Dillon MRN: [id number] DOB: 08/30/1951   Cancelled Treatment:    Reason Eval/Treat Not Completed: Patient declined, recently placed back to bed, declined up.  OT to continue efforts, may consider discharge from OT as he continues to do well needing generalized supervision.    Sam Overbeck D Aryelle Figg 01/26/2022, 5:26 PM

## 2022-01-26 NOTE — Progress Notes (Signed)
Nutrition Follow-up  DOCUMENTATION CODES:   Not applicable  INTERVENTION:   Continue Multivitamin w/ minerals daily Decrease Ensure Enlive po Daily, each supplement provides 350 kcal and 20 grams of protein.  NUTRITION DIAGNOSIS:   Inadequate oral intake related to lethargy/confusion as evidenced by meal completion < 50%. - Progressing  GOAL:   Patient will meet greater than or equal to 90% of their needs - Ongoing  MONITOR:   PO intake, Supplement acceptance, Weight trends  REASON FOR ASSESSMENT:   Malnutrition Screening Tool    ASSESSMENT:   71 y.o. male presented to the ED after a syncopal episode. PMH includes Alzheimer's disease, R BKA, and EtOH abuse. Pt admitted with EtOH withdrawal, syncope, and diarrhea.   Pt reports that his appetite was good at home and is good currently. Reports that he is eating well. Denies any nausea or vomiting. Per EMR, pt intake includes: 2/19: Dinner 100% 2/20: Breakfast 100%, Lunch 85%, Dinner 100% 2/21: Breakfast 50%, Lunch 100%, Dinner 100%  Pt reports that he has been drinking Ensure and that he does like them. Per Meds History, pt has not accepted the past 4 Ensure's offered. Discussed that we will decrease to 1 per day since his PO intake is good.   Pt was unsure of any weight loss PTA.   Medications reviewed and include: Folic Acid, MVI, Thiamine, IV antibiotics  Labs reviewed.   Diet Order:   Diet Order             Diet Heart Room service appropriate? Yes; Fluid consistency: Thin  Diet effective now                   EDUCATION NEEDS:   No education needs have been identified at this time  Skin:  Skin Assessment: Reviewed RN Assessment  Last BM:  2/22  Height:   Ht Readings from Last 1 Encounters:  01/20/22 5\' 8"  (1.727 m)    Weight:   Wt Readings from Last 1 Encounters:  01/26/22 72.4 kg    Ideal Body Weight:  70 kg  BMI:  Body mass index is 24.27 kg/m.  Estimated Nutritional Needs:    Kcal:  2000-2200  Protein:  100-115 grams  Fluid:  >/= 2 L    Sunjai Levandoski Louie Casa, RD, LDN Clinical Dietitian See [facility] for contact information.

## 2022-01-26 NOTE — Care Management Important Message (Signed)
Important Message  Patient Details  Name: BOBBI KOZAKIEWICZ MRN: [id number] Date of Birth: 1951/03/19   Medicare Important Message Given:  Yes     Shelda Altes 01/26/2022, 7:54 AM

## 2022-01-26 NOTE — Progress Notes (Addendum)
History                       PROGRESS NOTE    Austin Dillon  SPQ:[id number] DOB: 23-May-1951 DOA: 01/19/2022 PCP: Baxter Hire, MD  Brief Narrative: 71 y.o. 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  EMS reported lethargy, poor appetite, and diarrhea.  -Wife reported to ED provider that patient was sitting on the toilet having a bowel movement when all of a sudden he lost consciousness and fell off the commode onto his left side.  -He also apparently intermittently drinks a lot of alcohol, he had about 18 cans of beer 2 days before this episode, according to the wife for the last 2 weeks he has been drinking on a daily basis. -Admitted for syncope, suspected alcohol withdrawal seizure -Hospital course complicated by diarrhea and other withdrawal symptoms, now with cellulitis and superficial vein thrombosis in the right antecubital fossa   Subjective: -Complains of worsening pain and discomfort in his right arm  Assessment and Plan: * Syncope- (present on admission) Syncope -Suspected alcohol withdrawal seizure, less likely vasovagal episode -EEG and MRI brain were unremarkable -Echo with preserved EF, no significant valvular disease -Increase activity as tolerated  Alcohol withdrawal ([place])- (present on admission) -Reportedly started worsening alcohol withdrawals from 2/17 -Improving on high-dose scheduled Librium and as needed Haldol -Improving, cut down Librium dose -Increase activity as tolerated  Cellulitis Developed swelling redness and tenderness in right arm antecubital fossa, slight worsening today, will discontinue Keflex and start IV Ancef, Dopplers negative for DVT, superficial vein thrombosis noted -Recommend elevation, warm compresses and antibiotics as above, would not anticoagulate for this especially in the  setting of heavy alcohol use  Hx of BKA, right (HCC) Uses a prosthesis  Leukocytosis- (present on admission) Diarrhea -Both resolved, likely reactive  Alzheimer's dementia ([place])- (present on admission) -Delirium precautions  Depression with anxiety- (present on admission) - Resume Cymbalta  DVT prophylaxis: Lovenox Code Status: Full code Family Communication: No family at bedside Disposition Plan: Home in 1 to 2 days  Consultants:    Procedures:   Antimicrobials: Keflex 2/21   Objective: Vitals:   01/26/22 0000 01/26/22 0400 01/26/22 0802 01/26/22 1040  BP: (!) 113/58 118/64 137/63 108/63  Pulse: 67 71 77 68  Resp: 20 19 20 20   Temp: 98.6 F (37 C) 98.1 F (36.7 C) 98.8 F (37.1 C) 98.7 F (37.1 C)  TempSrc: Oral Oral Oral Oral  SpO2: 94% 94% 93% 94%  Weight:  72.4 kg    Height:        Intake/Output Summary (Last 24 hours) at 01/26/2022 1056 Last data filed at 01/26/2022 3335 Gross per 24 hour  Intake 594 ml  Output 700 ml  Net -106 ml   Filed Weights   01/23/22 0400 01/25/22 0519 01/26/22 0400  Weight: 76 kg 75.4 kg 72.4 kg    Examination:  General exam: Pleasant elderly male sitting up in bed, AAOx3, no distress HEENT: No JVD CVS: S1-S2, regular rate rhythm Lungs: Decreased breath sounds bases Abdomen: Soft, nontender, bowel sounds present Extremities: Right BKA, no edema Right arm with warmth tenderness and swelling just above the antecubital fossa Psychiatry:  Mood & affect appropriate.     Data Reviewed:   CBC: Recent Labs  Lab 01/22/22 0356 01/23/22 0458 01/24/22 0351 01/25/22 0230 01/26/22  0413  WBC 6.7 8.0 9.0 8.2 9.2  NEUTROABS 3.7 4.8 5.3 4.9 5.8  HGB 13.9 13.5 13.7 13.3 14.0  HCT 40.6 39.9 40.6 40.5 41.3  MCV 100.0 102.6* 101.8* 103.3* 101.0*  PLT 206 205 216 226 161   Basic Metabolic Panel: Recent Labs  Lab 01/21/22 0717 01/22/22 0356 01/23/22 0458 01/24/22 0351 01/25/22 0230 01/26/22 0413  NA 138 139 141 139  140 139  K 4.0 3.6 3.7 3.8 4.0 4.1  CL 104 104 108 107 106 105  CO2 22 27 25 24 26 25   GLUCOSE 93 111* 134* 111* 103* 115*  BUN 18 17 15 13 14 14   CREATININE 1.02 0.87 0.79 0.80 0.80 0.89  CALCIUM 9.2 8.7* 8.4* 8.7* 8.8* 9.1  MG 1.9 2.2 2.0 2.3  --   --    GFR: Estimated Creatinine Clearance: 74.7 mL/min (by C-G formula based on SCr of 0.89 mg/dL). Liver Function Tests: Recent Labs  Lab 01/19/22 1728 01/21/22 0717 01/22/22 0356 01/23/22 0458 01/24/22 0351  AST 40 53* 40 43* 36  ALT 26 36 32 44 47*  ALKPHOS 73 73 64 61 72  BILITOT 2.2* 1.6* 0.8 0.5 0.5  PROT 7.0 6.8 5.9* 5.8* 6.1*  ALBUMIN 3.7 3.6 3.0* 2.9* 3.0*   No results for input(s): LIPASE, AMYLASE in the last 168 hours. Recent Labs  Lab 01/19/22 1756  AMMONIA 28   Coagulation Profile: No results for input(s): INR, PROTIME in the last 168 hours. Cardiac Enzymes: No results for input(s): CKTOTAL, CKMB, CKMBINDEX, TROPONINI in the last 168 hours. BNP (last 3 results) No results for input(s): PROBNP in the last 8760 hours. HbA1C: No results for input(s): HGBA1C in the last 72 hours. CBG: Recent Labs  Lab 01/19/22 1444  GLUCAP 227*   Lipid Profile: No results for input(s): CHOL, HDL, LDLCALC, TRIG, CHOLHDL, LDLDIRECT in the last 72 hours. Thyroid Function Tests: No results for input(s): TSH, T4TOTAL, FREET4, T3FREE, THYROIDAB in the last 72 hours. Anemia Panel: No results for input(s): VITAMINB12, FOLATE, FERRITIN, TIBC, IRON, RETICCTPCT in the last 72 hours. Urine analysis:    Component Value Date/Time   COLORURINE YELLOW 01/19/2022 1952   APPEARANCEUR CLEAR 01/19/2022 1952   LABSPEC 1.023 01/19/2022 1952   PHURINE 5.0 01/19/2022 1952   GLUCOSEU 50 (A) 01/19/2022 [place] NEGATIVE 01/19/2022 [place] NEGATIVE 01/19/2022 [place] NEGATIVE 01/19/2022 1952   PROTEINUR 30 (A) 01/19/2022 1952   NITRITE NEGATIVE 01/19/2022 1952   LEUKOCYTESUR NEGATIVE 01/19/2022 1952   Sepsis  Labs: @LABRCNTIP (procalcitonin:4,lacticidven:4)  ) Recent Results (from the past 240 hour(s))  Resp Panel by RT-PCR (Flu A&B, Covid) Nasopharyngeal Swab     Status: None   Collection Time: 01/20/22 12:14 AM   Specimen: Nasopharyngeal Swab; Nasopharyngeal(NP) swabs in vial transport medium  Result Value Ref Range Status   SARS Coronavirus 2 by RT PCR NEGATIVE NEGATIVE Final    Comment: (NOTE) SARS-CoV-2 target nucleic acids are NOT DETECTED.  The SARS-CoV-2 RNA is generally detectable in upper respiratory specimens during the acute phase of infection. The lowest concentration of SARS-CoV-2 viral copies this assay can detect is 138 copies/mL. A negative result does not preclude SARS-Cov-2 infection and should not be used as the sole basis for treatment or other patient management decisions. A negative result may occur with  improper specimen collection/handling, submission of specimen other than nasopharyngeal swab, presence of viral mutation(s) within the areas targeted by this assay, and inadequate number of viral copies(<138  copies/mL). A negative result must be combined with clinical observations, patient history, and epidemiological information. The expected result is Negative.  Fact Sheet for Patients:  EntrepreneurPulse.com.au  Fact Sheet for Healthcare Providers:  IncredibleEmployment.be  This test is no t yet approved or cleared by the [place] FDA and  has been authorized for detection and/or diagnosis of SARS-CoV-2 by FDA under an Emergency Use Authorization (EUA). This EUA will remain  in effect (meaning this test can be used) for the duration of the COVID-19 declaration under Section 564(b)(1) of the Act, 21 U.S.C.section 360bbb-3(b)(1), unless the authorization is terminated  or revoked sooner.       Influenza A by PCR NEGATIVE NEGATIVE Final   Influenza B by PCR NEGATIVE NEGATIVE Final    Comment: (NOTE) The Xpert  Xpress SARS-CoV-2/FLU/RSV plus assay is intended as an aid in the diagnosis of influenza from Nasopharyngeal swab specimens and should not be used as a sole basis for treatment. Nasal washings and aspirates are unacceptable for Xpert Xpress SARS-CoV-2/FLU/RSV testing.  Fact Sheet for Patients: EntrepreneurPulse.com.au  Fact Sheet for Healthcare Providers: IncredibleEmployment.be  This test is not yet approved or cleared by the [place] FDA and has been authorized for detection and/or diagnosis of SARS-CoV-2 by FDA under an Emergency Use Authorization (EUA). This EUA will remain in effect (meaning this test can be used) for the duration of the COVID-19 declaration under Section 564(b)(1) of the Act, 21 U.S.C. section 360bbb-3(b)(1), unless the authorization is terminated or revoked.  Performed at [facility], [place] [address] [place], [place] [address]      Radiology Studies: VAS [place] UPPER EXTREMITY VENOUS DUPLEX  Result Date: 01/25/2022 UPPER VENOUS STUDY  Patient Name:  Austin Dillon  Date of Exam:   01/25/2022 Medical Rec #: [id number]        Accession #:    [id number] Date of Birth: Apr 05, 1951       Patient Gender: M Patient Age:   47 years Exam Location:  [facility] Procedure:      VAS [place] UPPER EXTREMITY VENOUS DUPLEX Referring Phys: Deno Etienne [facility] --------------------------------------------------------------------------------  Indications: Right arm swelling at the IV site Comparison Study: no prior Performing Technologist: Archie Patten RVS  Examination Guidelines: A complete evaluation includes B-mode imaging, spectral Doppler, color Doppler, and power Doppler as needed of all accessible portions of each vessel. Bilateral testing is considered an integral part of a complete examination. Limited examinations for reoccurring indications may be performed as noted.  Right Findings:  +----------+------------+---------+-----------+----------+-----------------+  RIGHT      Compressible Phasicity Spontaneous Properties      Summary       +----------+------------+---------+-----------+----------+-----------------+  IJV            Full        Yes        Yes                                   +----------+------------+---------+-----------+----------+-----------------+  Subclavian     Full        Yes        Yes                                   +----------+------------+---------+-----------+----------+-----------------+  Axillary       Full        Yes  Yes                                   +----------+------------+---------+-----------+----------+-----------------+  Brachial       Full        Yes        Yes                                   +----------+------------+---------+-----------+----------+-----------------+  Radial         Full                                                         +----------+------------+---------+-----------+----------+-----------------+  Ulnar          Full                                                         +----------+------------+---------+-----------+----------+-----------------+  Cephalic       Full                                                         +----------+------------+---------+-----------+----------+-----------------+  Basilic        None                                      Age Indeterminate  +----------+------------+---------+-----------+----------+-----------------+  Left Findings: +----------+------------+---------+-----------+----------+-------+  LEFT       Compressible Phasicity Spontaneous Properties Summary  +----------+------------+---------+-----------+----------+-------+  Subclavian     Full        Yes        Yes                         +----------+------------+---------+-----------+----------+-------+  Summary:  Right: No evidence of deep vein thrombosis in the upper extremity. Findings consistent with age indeterminate superficial  vein thrombosis involving the right basilic vein.  Left: No evidence of thrombosis in the subclavian.  *See table(s) above for measurements and observations.  Diagnosing physician: Servando Snare MD Electronically signed by Servando Snare MD on 01/25/2022 at 2:08:10 PM.    Final      Scheduled Meds:  aspirin  325 mg Oral Daily   carvedilol  6.25 mg Oral BID WC   chlordiazePOXIDE  5 mg Oral BID   DULoxetine  30 mg Oral Daily   enoxaparin (LOVENOX) injection  40 mg Subcutaneous Q24H   feeding supplement  237 mL Oral BID BM   folic acid  1 mg Oral Daily   multivitamin with minerals  1 tablet Oral Daily   nicotine  21 mg Transdermal QHS   thiamine  100 mg Oral Daily   Or   thiamine  100 mg Intravenous Daily   Continuous Infusions:   ceFAZolin (ANCEF) IV 1 g (01/26/22  1041)     LOS: 6 days    Time spent: 41min  Domenic Polite, MD [facility]   01/26/2022, 10:56 AM

## 2022-01-26 NOTE — Progress Notes (Signed)
Mobility Specialist Progress Note:   01/26/22 1215  Mobility  Activity Ambulated with assistance to bathroom  Level of Assistance Standby assist, set-up cues, supervision of patient - no hands on  Assistive Device Front wheel walker  Distance Ambulated (ft) 15 ft  Activity Response Tolerated well  $Mobility charge 1 Mobility   Pt requesting to get back in bed, declining chair at this time. Pt not requiring any physical assist. Back in bed with all needs met.   Nelta Numbers Acute Rehab Phone: [phone] Office Phone: [phone]

## 2022-01-26 NOTE — Progress Notes (Signed)
Mobility Specialist Progress Note:   01/26/22 1200  Mobility  Activity Ambulated with assistance in hallway  Level of Assistance Standby assist, set-up cues, supervision of patient - no hands on  Assistive Device Front wheel walker  Distance Ambulated (ft) 250 ft  Activity Response Tolerated well  $Mobility charge 1 Mobility   Pt required no physical assist throughout session. Left in BR educated to pull string. Will f/u to get back to bed.   Nelta Numbers Acute Rehab Phone: [phone] Office Phone: [phone]

## 2022-01-26 NOTE — Assessment & Plan Note (Signed)
Uses a prosthesis

## 2022-01-26 NOTE — Assessment & Plan Note (Signed)
Developed swelling redness and tenderness in right arm antecubital fossa, slight worsening today, will discontinue Keflex and start IV Ancef, Dopplers negative for DVT, superficial vein thrombosis noted -Recommend elevation, warm compresses and antibiotics as above, would not anticoagulate for this especially in the setting of heavy alcohol use

## 2022-01-27 LAB — BASIC METABOLIC PANEL
Anion gap: 9 (ref 5–15)
BUN: 17 mg/dL (ref 8–23)
CO2: 25 mmol/L (ref 22–32)
Calcium: 8.9 mg/dL (ref 8.9–10.3)
Chloride: 104 mmol/L (ref 98–111)
Creatinine, Ser: 0.92 mg/dL (ref 0.61–1.24)
GFR, Estimated: >60 mL/min (ref >60)
Glucose, Bld: 100 mg/dL — ABNORMAL HIGH (ref 70–99)
Potassium: 4 mmol/L (ref 3.5–5.1)
Sodium: 138 mmol/L (ref 135–145)

## 2022-01-27 LAB — CBC WITH DIFFERENTIAL/PLATELET
Abs Immature Granulocytes: 0.04 10*3/uL (ref 0.00–0.07)
Basophils Absolute: 0 10*3/uL (ref 0.0–0.1)
Basophils Relative: 1 %
Eosinophils Absolute: 0.2 10*3/uL (ref 0.0–0.5)
Eosinophils Relative: 2 %
HCT: 41.7 % (ref 39.0–52.0)
Hemoglobin: 14.1 g/dL (ref 13.0–17.0)
Immature Granulocytes: 1 %
Lymphocytes Relative: 30 %
Lymphs Abs: 2.5 10*3/uL (ref 0.7–4.0)
MCH: 34.2 pg — ABNORMAL HIGH (ref 26.0–34.0)
MCHC: 33.8 g/dL (ref 30.0–36.0)
MCV: 101.2 fL — ABNORMAL HIGH (ref 80.0–100.0)
Monocytes Absolute: 0.7 10*3/uL (ref 0.1–1.0)
Monocytes Relative: 8 %
Neutro Abs: 4.8 10*3/uL (ref 1.7–7.7)
Neutrophils Relative %: 58 %
Platelets: 268 10*3/uL (ref 150–400)
RBC: 4.12 MIL/uL — ABNORMAL LOW (ref 4.22–5.81)
RDW: 12.4 % (ref 11.5–15.5)
WBC: 8.2 10*3/uL (ref 4.0–10.5)
nRBC: 0 % (ref 0.0–0.2)

## 2022-01-27 MED ORDER — ALPRAZOLAM 2 MG PO TABS: 1.0000 mg | ORAL_TABLET | Freq: Three times a day (TID) | ORAL | 0 refills | Status: AC | PRN

## 2022-01-27 MED ORDER — CARVEDILOL 6.25 MG PO TABS
6.2500 mg | ORAL_TABLET | Freq: Two times a day (BID) | ORAL | 0 refills | Status: AC
Start: 2022-01-27 — End: ?

## 2022-01-27 MED ORDER — ASPIRIN 325 MG PO TABS
325.0000 mg | ORAL_TABLET | Freq: Every day | ORAL | 0 refills | Status: AC
Start: 2022-01-27 — End: 2022-02-06

## 2022-01-27 MED ORDER — CEPHALEXIN 250 MG PO CAPS: 250.0000 mg | ORAL_CAPSULE | Freq: Four times a day (QID) | ORAL | 0 refills | Status: AC

## 2022-01-27 NOTE — Progress Notes (Signed)
Mobility Specialist Progress Note:   01/27/22 1000  Mobility  Activity Ambulated with assistance in hallway  Level of Assistance Standby assist, set-up cues, supervision of patient - no hands on  Assistive Device Front wheel walker  Distance Ambulated (ft) 470 ft  Activity Response Tolerated well  $Mobility charge 1 Mobility   Pt agreeable to mobility session this am. Not requiring any physical assist throughout. Pt left in BR, educated to pull NSG string when ready. Will f/u to get back to bed.  Nelta Numbers Acute Rehab Phone: [phone] Office Phone: [phone]

## 2022-01-27 NOTE — Progress Notes (Signed)
Went over discharge instructions with patient and patient's wife. RN educated patient about the importance of taking all oral antibiotics and to maintain elevation of right arm. Patient and patient's wife made aware of where to pick up medications and to get in contact with a follow up with PCP. Patient and patient's wife verbalize that they have no further questions. PIV has been removed. Tele monitor has been removed and CCMD has been notified of patient's discharge.

## 2022-01-27 NOTE — Progress Notes (Signed)
Physical Therapy Treatment Patient Details Name: Austin Dillon MRN: [id number] DOB: 01-Apr-1951 Today's Date: 01/27/2022   History of Present Illness The pt is a 71 yo male presenting 2/15 after a syncopal episode at home. Upon work up, pt with acute metabolic encephalopathy with possible vasovagal syncope. PMH includes: alzheimer's dementia, R BKA, anxiety, depression, PTSD, and alcohol abuse.    PT Comments    Pt agreeable to mobility this morning prior to anticipated d/c home today. The pt was able to complete sit-stand transfers without need for UE support, and was able to complete short bout of ambulation without use of RW, but does demo increased instability and need for minG without UE support. Session focused on education for fall risk as pt with decreased balance, small dogs underfoot at home, and past history of ETOH abuse. With this in mind, pt at high risk for falls and injury. Pt states he has no support other than wife at home, and per note from SW on 2/21, his wife has been encouraged to pursue moving to domestic violence shelter. Pt will also require transportion to home and assist for medical management as he states during session he is unable to manage without his wife's assist PTA due to dementia. Will continue to follow for dynamic balance intervention during admission, pt states he does not need HHPT following d/c.     Recommendations for follow up therapy are one component of a multi-disciplinary discharge planning process, led by the attending physician.  Recommendations may be updated based on patient status, additional functional criteria and insurance authorization.  Follow Up Recommendations  Long-term institutional care without follow-up therapy     Assistance Recommended at Discharge Intermittent Supervision/Assistance  Patient can return home with the following Direct supervision/assist for medications management;Direct supervision/assist for financial  management;Assistance with cooking/housework;A little help with walking and/or transfers;Assist for transportation   Equipment Recommendations  None recommended by PT    Recommendations for Other Services       Precautions / Restrictions Precautions Precautions: Fall Precaution Comments: R prosthetic leg Restrictions Weight Bearing Restrictions: No     Mobility  Bed Mobility Overal bed mobility: Modified Independent             General bed mobility comments: sitting EOB    Transfers Overall transfer level: Modified independent Equipment used: None Transfers: Sit to/from Stand Sit to Stand: Min guard           General transfer comment: minG for safety, pt with mild instability with initial stand    Ambulation/Gait Ambulation/Gait assistance: Min guard Gait Distance (Feet): 75 Feet Assistive device: None Gait Pattern/deviations: Step-through pattern Gait velocity: decr     General Gait Details: pt with mild instability, increased knee flexion RLE in stance      Balance Overall balance assessment: Mild deficits observed, not formally tested                                          Cognition Arousal/Alertness: Awake/alert Behavior During Therapy: WFL for tasks assessed/performed Overall Cognitive Status: History of cognitive impairments - at baseline                                 General Comments: pt with hx of dementia, pleasant today but no recall of events from yesterday  Exercises      General Comments General comments (skin integrity, edema, etc.): BP stable, pt states he has no other assist at home other than his wife. discussed implications of alcohol and small dogs on fall risk      Pertinent Vitals/Pain Pain Assessment Pain Assessment: No/denies pain     PT Goals (current goals can now be found in the care plan section) Acute Rehab PT Goals Patient Stated Goal: to maintain independence with  gait PT Goal Formulation: With patient Time For Goal Achievement: 02/05/22 Potential to Achieve Goals: Good Progress towards PT goals: Progressing toward goals    Frequency    Min 2X/week      PT Plan Current plan remains appropriate       AM-PAC PT "6 Clicks" Mobility   Outcome Measure  Help needed turning from your back to your side while in a flat bed without using bedrails?: None Help needed moving from lying on your back to sitting on the side of a flat bed without using bedrails?: None Help needed moving to and from a bed to a chair (including a wheelchair)?: None Help needed standing up from a chair using your arms (e.g., wheelchair or bedside chair)?: None Help needed to walk in hospital room?: A Little Help needed climbing 3-5 steps with a railing? : A Little 6 Click Score: 22    End of Session Equipment Utilized During Treatment: Gait belt Activity Tolerance: Patient tolerated treatment well Patient left: with call bell/phone within reach;in bed Nurse Communication: Mobility status (pt asking about when he will be d/c wife needs to be present for d/c instructions) PT Visit Diagnosis: Other abnormalities of gait and mobility (R26.89)     Time: 3545-6256 PT Time Calculation (min) (ACUTE ONLY): 9 min  Charges:  $Self Care/Home Management: 8-22                     West Carbo, PT, DPT   Acute Rehabilitation Department Pager #: [phone]   Sandra Cockayne 01/27/2022, 11:01 AM

## 2022-01-27 NOTE — TOC Transition Note (Addendum)
Transition of Care [facility]) - CM/SW Discharge Note   Patient Details  Name: Austin Dillon MRN: [id number] Date of Birth: Apr 11, 1951  Transition of Care [facility]) CM/SW Contact:  Zenon Mayo, RN Phone Number: 01/27/2022, 9:51 AM   Clinical Narrative:    Patient is for dc today, NCM offered choice to patient with Medicare.gov list for [facility] , he states he does not want a HHRN.  NCM gave him the SA resources as well. NCM asked who will be  transporting him home, he states his wife will be. CSW has been working with patient to see if he wanted to go to a facility , patient does not want to go per CSW. He has no other needs.  He has a rolling walker at home.         Patient Goals and CMS Choice        Discharge Placement                       Discharge Plan and Services                                     Social Determinants of Health (SDOH) Interventions     Readmission Risk Interventions No flowsheet data found.

## 2022-01-27 NOTE — Discharge Summary (Signed)
Physician Discharge Summary  Austin Dillon EQA:[id number] DOB: 1951/09/28 DOA: 01/19/2022  PCP: Baxter Hire, MD  Admit date: 01/19/2022 Discharge date: 01/27/2022  Time spent: 35 minutes  Recommendations for Outpatient Follow-up:  PCP in 1 week Home health RN  Discharge Diagnoses:  Principal Problem:   Syncope   Left arm AC cellulitis, thrombophlebitis   Alcohol abuse   Alcohol withdrawal ([place])   Cellulitis   Depression with anxiety   Mild Dementia ([place])   Leukocytosis   Hx of BKA, right ([place])   Discharge Condition: stable  Diet recommendation: regular  Filed Weights   01/25/22 0519 01/26/22 0400 01/27/22 0605  Weight: 75.4 kg 72.4 kg 71.9 kg    History of present illness:  71 y.o. 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  EMS reported lethargy, poor appetite, and diarrhea.  -Wife reported to ED provider that patient was sitting on the toilet having a bowel movement when all of a sudden he lost consciousness and fell off the commode onto his left side.  -He also apparently intermittently drinks a lot of alcohol, he had about 18 cans of beer 2 days before this episode, according to the wife for the last 2 weeks he has been drinking on a daily basis. -Admitted for syncope, suspected alcohol withdrawal seizure  Hospital Course:    Syncope -Suspected alcohol withdrawal seizure, less likely vasovagal episode -EEG and MRI brain were unremarkable -Echo with preserved EF, no significant valvular disease -ambulating with a walker, prosthesis without issues -discharged home in stable condition, FU with PCP in 1 week   Alcohol withdrawal ([place])- -Reportedly started worsening alcohol withdrawals from 2/17 -Improved on high-dose scheduled Librium and as needed Haldol -cut down Librium dose and transitioned back to home  regimen of xanax, dose decreased to 1mg  TID PRN from 2mg  TID   Cellulitis Developed swelling redness and tenderness in right arm antecubital fossa at site of IV in R AC, IV removed, Dopplers negative for DVT, superficial vein thrombosis/thrombophlebitis noted -Recommend elevation, warm compresses and antibiotics as above, would not anticoagulate for this especially in the setting of heavy alcohol use, ASA daily for 10days -discharged on oral keflex x7days   Hx of BKA, right ([place]) Uses a prosthesis   Leukocytosis-  Diarrhea -Both resolved, likely reactive   Alzheimer's dementia ([place])- -Delirium precautions, stable   Depression with anxiety-  - Resumed Cymbalta  Discharge Exam: Vitals:   01/27/22 0605 01/27/22 0731  BP: 125/65 117/66  Pulse: 69 68  Resp: 16 18  Temp: 98 F (36.7 C) 98 F (36.7 C)  SpO2: 94% 94%   General exam: Pleasant elderly male sitting up in bed, AAOx3, no distress HEENT: No JVD CVS: S1-S2, regular rate rhythm Lungs: Decreased breath sounds bases Abdomen: Soft, nontender, bowel sounds present Extremities: Right BKA, no edema Right arm with warmth tenderness and swelling just above the antecubital fossa-improving Psychiatry:  Mood & affect appropriate.  Discharge Instructions   Discharge Instructions     Diet - low sodium heart healthy   Complete by: As directed    Discharge instructions   Complete by: As directed    Keep Left arm elevated and use warm compresses for next 3-4days   Increase activity slowly   Complete by: As directed    No wound care   Complete by: As directed  Allergies as of 01/27/2022       Reactions   Nortriptyline Other (See Comments)   Severe mood swings   Aricept [donepezil] Other (See Comments)   Makes the patient look like a "mummy" (too sedating)        Medication List     TAKE these medications    acetaminophen 500 MG tablet Commonly known as: TYLENOL Take 1,000 mg by mouth every 4 (four) hours  as needed for mild pain or headache.   alprazolam 2 MG tablet Commonly known as: XANAX Take 0.5 tablets (1 mg total) by mouth 3 (three) times daily as needed for anxiety. What changed:  how much to take when to take this reasons to take this   aspirin 325 MG tablet Take 1 tablet (325 mg total) by mouth daily for 10 days.   busPIRone 5 MG tablet Commonly known as: BUSPAR Take 5 mg by mouth 2 (two) times daily.   carvedilol 6.25 MG tablet Commonly known as: COREG Take 1 tablet (6.25 mg total) by mouth 2 (two) times daily with a meal.   DULoxetine 20 MG capsule Commonly known as: CYMBALTA Take 20 mg by mouth daily.   tamsulosin 0.4 MG Caps capsule Commonly known as: FLOMAX Take 1 capsule (0.4 mg total) by mouth daily after supper.       Allergies  Allergen Reactions   Nortriptyline Other (See Comments)    Severe mood swings   Aricept [Donepezil] Other (See Comments)    Makes the patient look like a "mummy" (too sedating)      The results of significant diagnostics from this hospitalization (including imaging, microbiology, ancillary and laboratory) are listed below for reference.    Significant Diagnostic Studies: DG Chest 1 View  Result Date: 01/20/2022 CLINICAL DATA:  Provided history: Shortness of breath, syncopal episode/possible seizure. EXAM: CHEST  1 VIEW COMPARISON:  Prior chest radiographs 12/17/2021 and earlier. FINDINGS: Heart size within normal limits. Mild ill-defined opacity within the bilateral lung bases. No evidence of pleural effusion or pneumothorax. No acute bony abnormality identified. IMPRESSION: Mild ill-defined opacity within the bilateral lung bases, which may reflect atelectasis. Pneumonia is difficult to exclude and clinical correlation is recommended. Electronically Signed   By: Kellie Simmering D.O.   On: 01/20/2022 09:02   DG Abdomen 1 View  Result Date: 01/19/2022 CLINICAL DATA:  Trauma EXAM: ABDOMEN - 1 VIEW COMPARISON:  CT done on  09/23/2021 FINDINGS: Bowel gas pattern is nonspecific. No abnormal masses or calcifications are seen. Kidneys are partly obscured by bowel contents. Degenerative changes are noted at lumbosacral junction. There is previous right hip arthroplasty. IMPRESSION: Nonspecific bowel gas pattern. Electronically Signed   By: Elmer Picker M.D.   On: 01/19/2022 16:51   CT Head Wo Contrast  Result Date: 01/19/2022 CLINICAL DATA:  Seizure. EXAM: CT HEAD WITHOUT CONTRAST TECHNIQUE: Contiguous axial images were obtained from the base of the skull through the vertex without intravenous contrast. RADIATION DOSE REDUCTION: This exam was performed according to the departmental dose-optimization program which includes automated exposure control, adjustment of the mA and/or kV according to patient size and/or use of iterative reconstruction technique. COMPARISON:  Head CT dated 05/18/2018. FINDINGS: Brain: Mild age-related atrophy and chronic microvascular ischemic changes. There is no acute intracranial hemorrhage. No mass effect or midline shift. No extra-axial fluid collection. Vascular: No hyperdense vessel or unexpected calcification. Skull: Normal. Negative for fracture or focal lesion. Sinuses/Orbits: No acute finding. Other: None IMPRESSION: 1. No acute intracranial pathology. 2.  Mild age-related atrophy and chronic microvascular ischemic changes. Electronically Signed   By: Anner Crete M.D.   On: 01/19/2022 20:11   MR BRAIN WO CONTRAST  Result Date: 01/20/2022 CLINICAL DATA:  Seizure disorder, clinical change EXAM: MRI HEAD WITHOUT CONTRAST TECHNIQUE: Multiplanar, multiecho pulse sequences of the brain and surrounding structures were obtained without intravenous contrast. COMPARISON:  CT head January 19, 2022.  MRI 12/21/2017. FINDINGS: Brain: No acute infarction, hemorrhage, hydrocephalus, extra-axial collection or mass lesion. Mild scattered T2/FLAIR hyperintensities in the white matter, nonspecific but  compatible with chronic microvascular ischemic disease that is very mild for patient age. Mild atrophy. Vascular: Major arterial flow voids are maintained skull base. Skull and upper cervical spine: Normal marrow signal. Sinuses/Orbits: Negative. Other: No mastoid effusions IMPRESSION: 1. No evidence of acute intracranial abnormality on this mildly motion limited study. 2. Mild cerebral atrophy (ICD10-G31.9). Electronically Signed   By: Margaretha Sheffield M.D.   On: 01/20/2022 10:39   DG Shoulder Left  Result Date: 01/19/2022 CLINICAL DATA:  Trauma EXAM: LEFT SHOULDER - 2+ VIEW COMPARISON:  07/15/2021 FINDINGS: No fracture or dislocation is seen. Degenerative changes are noted in the left [facility] joint with bony spurs. There are no abnormal soft tissue calcifications. IMPRESSION: No fracture or dislocation is seen in the left shoulder. Electronically Signed   By: Elmer Picker M.D.   On: 01/19/2022 16:48   EEG adult  Result Date: 01/21/2022 Greta Doom, MD     01/21/2022 10:52 AM History: 71 yo M being evaluated for syncope Sedation: none Technique: This EEG was acquired with electrodes placed according to the International 10-20 electrode system (including Fp1, Fp2, F3, F4, C3, C4, P3, P4, O1, O2, T3, T4, T5, T6, A1, A2, Fz, Cz, Pz). The following electrodes were missing or displaced: none. Background: The background consists of intermixed alpha and beta activities. There is a well defined posterior dominant rhythm of 9 Hz that attenuates with eye opening. Sleep is not recorded. Photic stimulation: Physiologic driving is not performed EEG Abnormalities: none Clinical Interpretation: This normal EEG is recorded in the waking state. There was no seizure or seizure predisposition recorded on this study. Please note that lack of epileptiform activity on EEG does not preclude the possibility of epilepsy. Roland Rack, MD [facility] [phone] If 7pm- 7am, please page neurology on  call as listed in [place].   ECHOCARDIOGRAM COMPLETE  Result Date: 01/20/2022    ECHOCARDIOGRAM REPORT   Patient Name:   TAYLAN MAREZ Date of Exam: 01/20/2022 Medical Rec #:  [id number]       Height:       68.0 in Accession #:    [id number]      Weight:       170.0 lb Date of Birth:  December 24, 1950      BSA:          1.907 m Patient Age:    19 years        BP:           149/76 mmHg Patient Gender: M               HR:           75 bpm. Exam Location:  Inpatient Procedure: 2D Echo, Cardiac Doppler and Color Doppler Indications:    Syncope  History:        Patient has no prior history of Echocardiogram examinations.  Sonographer:    Jyl Heinz Referring Phys: [id number] [place]  1.  Left ventricular ejection fraction, by estimation, is 60 to 65%. The left ventricle has normal function. The left ventricle has no regional wall motion abnormalities. Left ventricular diastolic parameters are consistent with Grade I diastolic dysfunction (impaired relaxation).  2. Right ventricular systolic function is normal. The right ventricular size is normal.  3. The mitral valve is normal in structure. Trivial mitral valve regurgitation.  4. The aortic valve is tricuspid. There is mild calcification of the aortic valve. There is mild thickening of the aortic valve. Aortic valve regurgitation is mild.  5. The inferior vena cava is normal in size with greater than 50% respiratory variability, suggesting right atrial pressure of 3 mmHg. Comparison(s): No prior Echocardiogram. FINDINGS  Left Ventricle: Left ventricular ejection fraction, by estimation, is 60 to 65%. The left ventricle has normal function. The left ventricle has no regional wall motion abnormalities. The left ventricular internal cavity size was normal in size. There is  no left ventricular hypertrophy. Left ventricular diastolic parameters are consistent with Grade I diastolic dysfunction (impaired relaxation). Right Ventricle: The right ventricular  size is normal. No increase in right ventricular wall thickness. Right ventricular systolic function is normal. Left Atrium: Left atrial size was normal in size. Right Atrium: Right atrial size was normal in size. Pericardium: There is no evidence of pericardial effusion. Mitral Valve: The mitral valve is normal in structure. Mild mitral annular calcification. Trivial mitral valve regurgitation. Tricuspid Valve: The tricuspid valve is normal in structure. Tricuspid valve regurgitation is trivial. Aortic Valve: The aortic valve is tricuspid. There is mild calcification of the aortic valve. There is mild thickening of the aortic valve. Aortic valve regurgitation is mild. Aortic regurgitation PHT measures 595 msec. Aortic valve peak gradient measures 9.4 mmHg. Pulmonic Valve: The pulmonic valve was normal in structure. Pulmonic valve regurgitation is trivial. Aorta: The aortic root and ascending aorta are structurally normal, with no evidence of dilitation. Venous: The inferior vena cava is normal in size with greater than 50% respiratory variability, suggesting right atrial pressure of 3 mmHg. IAS/Shunts: The atrial septum is grossly normal.  LEFT VENTRICLE PLAX 2D LVIDd:         4.30 cm      Diastology LVIDs:         3.00 cm      LV e' medial:    7.46 cm/s LV PW:         1.00 cm      LV E/e' medial:  8.0 LV IVS:        1.10 cm      LV e' lateral:   9.17 cm/s LVOT diam:     2.00 cm      LV E/e' lateral: 6.5 LV SV:         90 LV SV Index:   47 LVOT Area:     3.14 cm  LV Volumes (MOD) LV vol d, MOD A2C: 92.5 ml LV vol d, MOD A4C: 103.0 ml LV vol s, MOD A2C: 36.0 ml LV vol s, MOD A4C: 37.2 ml LV SV MOD A2C:     56.5 ml LV SV MOD A4C:     103.0 ml LV SV MOD BP:      64.8 ml RIGHT VENTRICLE             IVC RV Basal diam:  3.10 cm     IVC diam: 1.70 cm RV Mid diam:    2.70 cm RV S prime:     11.80 cm/s TAPSE (  M-mode): 2.2 cm LEFT ATRIUM             Index        RIGHT ATRIUM           Index LA diam:        3.70 cm 1.94  cm/m   RA Area:     12.90 cm LA Vol (A2C):   40.6 ml 21.29 ml/m  RA Volume:   28.60 ml  14.99 ml/m LA Vol (A4C):   39.2 ml 20.55 ml/m LA Biplane Vol: 40.0 ml 20.97 ml/m  AORTIC VALVE AV Area (Vmax): 2.75 cm AV Vmax:        153.00 cm/s AV Peak Grad:   9.4 mmHg LVOT Vmax:      134.00 cm/s LVOT Vmean:     107.000 cm/s LVOT VTI:       0.285 m AI PHT:         595 msec  AORTA Ao Root diam: 3.30 cm Ao Asc diam:  3.20 cm MITRAL VALVE MV Area (PHT): 2.75 cm    SHUNTS MV Decel Time: 276 msec    Systemic VTI:  0.29 m MV E velocity: 59.60 cm/s  Systemic Diam: 2.00 cm MV A velocity: 67.30 cm/s MV E/A ratio:  0.89 Gwyndolyn Kaufman MD Electronically signed by Gwyndolyn Kaufman MD Signature Date/Time: 01/20/2022/1:12:27 PM    Final    VAS [place] UPPER EXTREMITY VENOUS DUPLEX  Result Date: 01/25/2022 UPPER VENOUS STUDY  Patient Name:  AADIL SUR  Date of Exam:   01/25/2022 Medical Rec #: [id number]        Accession #:    [id number] Date of Birth: 24-Dec-1950       Patient Gender: M Patient Age:   61 years Exam Location:  [facility] Procedure:      VAS [place] UPPER EXTREMITY VENOUS DUPLEX Referring Phys: Deno Etienne [facility] --------------------------------------------------------------------------------  Indications: Right arm swelling at the IV site Comparison Study: no prior Performing Technologist: Archie Patten RVS  Examination Guidelines: A complete evaluation includes B-mode imaging, spectral Doppler, color Doppler, and power Doppler as needed of all accessible portions of each vessel. Bilateral testing is considered an integral part of a complete examination. Limited examinations for reoccurring indications may be performed as noted.  Right Findings: +----------+------------+---------+-----------+----------+-----------------+  RIGHT      Compressible Phasicity Spontaneous Properties      Summary       +----------+------------+---------+-----------+----------+-----------------+  IJV            Full        Yes         Yes                                   +----------+------------+---------+-----------+----------+-----------------+  Subclavian     Full        Yes        Yes                                   +----------+------------+---------+-----------+----------+-----------------+  Axillary       Full        Yes        Yes                                   +----------+------------+---------+-----------+----------+-----------------+  Brachial       Full        Yes        Yes                                   +----------+------------+---------+-----------+----------+-----------------+  Radial         Full                                                         +----------+------------+---------+-----------+----------+-----------------+  Ulnar          Full                                                         +----------+------------+---------+-----------+----------+-----------------+  Cephalic       Full                                                         +----------+------------+---------+-----------+----------+-----------------+  Basilic        None                                      Age Indeterminate  +----------+------------+---------+-----------+----------+-----------------+  Left Findings: +----------+------------+---------+-----------+----------+-------+  LEFT       Compressible Phasicity Spontaneous Properties Summary  +----------+------------+---------+-----------+----------+-------+  Subclavian     Full        Yes        Yes                         +----------+------------+---------+-----------+----------+-------+  Summary:  Right: No evidence of deep vein thrombosis in the upper extremity. Findings consistent with age indeterminate superficial vein thrombosis involving the right basilic vein.  Left: No evidence of thrombosis in the subclavian.  *See table(s) above for measurements and observations.  Diagnosing physician: Servando Snare MD Electronically signed by Servando Snare MD on 01/25/2022 at 2:08:10 PM.     Final     Microbiology: Recent Results (from the past 240 hour(s))  Resp Panel by RT-PCR (Flu A&B, Covid) Nasopharyngeal Swab     Status: None   Collection Time: 01/20/22 12:14 AM   Specimen: Nasopharyngeal Swab; Nasopharyngeal(NP) swabs in vial transport medium  Result Value Ref Range Status   SARS Coronavirus 2 by RT PCR NEGATIVE NEGATIVE Final    Comment: (NOTE) SARS-CoV-2 target nucleic acids are NOT DETECTED.  The SARS-CoV-2 RNA is generally detectable in upper respiratory specimens during the acute phase of infection. The lowest concentration of SARS-CoV-2 viral copies this assay can detect is 138 copies/mL. A negative result does not preclude SARS-Cov-2 infection and should not be used as the sole basis for treatment or other patient management decisions. A negative result may occur with  improper specimen collection/handling, submission of specimen other than nasopharyngeal swab, presence of viral mutation(s) within the areas  targeted by this assay, and inadequate number of viral copies(<138 copies/mL). A negative result must be combined with clinical observations, patient history, and epidemiological information. The expected result is Negative.  Fact Sheet for Patients:  EntrepreneurPulse.com.au  Fact Sheet for Healthcare Providers:  IncredibleEmployment.be  This test is no t yet approved or cleared by the [place] FDA and  has been authorized for detection and/or diagnosis of SARS-CoV-2 by FDA under an Emergency Use Authorization (EUA). This EUA will remain  in effect (meaning this test can be used) for the duration of the COVID-19 declaration under Section 564(b)(1) of the Act, 21 U.S.C.section 360bbb-3(b)(1), unless the authorization is terminated  or revoked sooner.       Influenza A by PCR NEGATIVE NEGATIVE Final   Influenza B by PCR NEGATIVE NEGATIVE Final    Comment: (NOTE) The Xpert Xpress SARS-CoV-2/FLU/RSV plus  assay is intended as an aid in the diagnosis of influenza from Nasopharyngeal swab specimens and should not be used as a sole basis for treatment. Nasal washings and aspirates are unacceptable for Xpert Xpress SARS-CoV-2/FLU/RSV testing.  Fact Sheet for Patients: EntrepreneurPulse.com.au  Fact Sheet for Healthcare Providers: IncredibleEmployment.be  This test is not yet approved or cleared by the [place] FDA and has been authorized for detection and/or diagnosis of SARS-CoV-2 by FDA under an Emergency Use Authorization (EUA). This EUA will remain in effect (meaning this test can be used) for the duration of the COVID-19 declaration under Section 564(b)(1) of the Act, 21 U.S.C. section 360bbb-3(b)(1), unless the authorization is terminated or revoked.  Performed at [facility], [place] [address], [place], [place] [address]      Labs: Basic Metabolic Panel: Recent Labs  Lab 01/21/22 [phone] 01/22/22 0356 01/23/22 0458 01/24/22 0351 01/25/22 0230 01/26/22 0413 01/27/22 0407  NA 138 139 141 139 140 139 138  K 4.0 3.6 3.7 3.8 4.0 4.1 4.0  CL 104 104 108 107 106 105 104  CO2 22 27 25 24 26 25 25   GLUCOSE 93 111* 134* 111* 103* 115* 100*  BUN 18 17 15 13 14 14 17   CREATININE 1.02 0.87 0.79 0.80 0.80 0.89 0.92  CALCIUM 9.2 8.7* 8.4* 8.7* 8.8* 9.1 8.9  MG 1.9 2.2 2.0 2.3  --   --   --    Liver Function Tests: Recent Labs  Lab 01/21/22 0717 01/22/22 0356 01/23/22 0458 01/24/22 0351  AST 53* 40 43* 36  ALT 36 32 44 47*  ALKPHOS 73 64 61 72  BILITOT 1.6* 0.8 0.5 0.5  PROT 6.8 5.9* 5.8* 6.1*  ALBUMIN 3.6 3.0* 2.9* 3.0*   No results for input(s): LIPASE, AMYLASE in the last 168 hours. No results for input(s): AMMONIA in the last 168 hours. CBC: Recent Labs  Lab 01/23/22 0458 01/24/22 0351 01/25/22 0230 01/26/22 0413 01/27/22 0407  WBC 8.0 9.0 8.2 9.2 8.2  NEUTROABS 4.8 5.3 4.9 5.8 4.8  HGB 13.5 13.7 13.3 14.0 14.1   HCT 39.9 40.6 40.5 41.3 41.7  MCV 102.6* 101.8* 103.3* 101.0* 101.2*  PLT 205 216 226 246 268   Cardiac Enzymes: No results for input(s): CKTOTAL, CKMB, CKMBINDEX, TROPONINI in the last 168 hours. BNP: BNP (last 3 results) Recent Labs    01/22/22 0356 01/23/22 0458 01/24/22 0351  BNP 19.1 21.1 31.4    ProBNP (last 3 results) No results for input(s): PROBNP in the last 8760 hours.  CBG: No results for input(s): GLUCAP in the last 168 hours.  Signed:  Domenic Polite MD.  [facility] 01/27/2022, 9:17 AM

## 2022-03-28 ENCOUNTER — Emergency Department: Payer: No Typology Code available for payment source

## 2022-03-28 ENCOUNTER — Other Ambulatory Visit: Payer: Self-pay

## 2022-03-28 ENCOUNTER — Emergency Department
Admission: EM | Admit: 2022-03-28 | Discharge: 2022-03-29 | Disposition: A | Discharge status: Home / Self Care | Payer: No Typology Code available for payment source | Attending: Emergency Medicine | Admitting: Emergency Medicine

## 2022-03-28 DIAGNOSIS — F0280 Dementia in other diseases classified elsewhere without behavioral disturbance: Secondary | ICD-10-CM | POA: Diagnosis not present

## 2022-03-28 DIAGNOSIS — G309 Alzheimer's disease, unspecified: Secondary | ICD-10-CM | POA: Diagnosis not present

## 2022-03-28 DIAGNOSIS — S43005A Unspecified dislocation of left shoulder joint, initial encounter: Secondary | ICD-10-CM | POA: Diagnosis not present

## 2022-03-28 DIAGNOSIS — S4992XA Unspecified injury of left shoulder and upper arm, initial encounter: Secondary | ICD-10-CM | POA: Diagnosis present

## 2022-03-28 DIAGNOSIS — W19XXXA Unspecified fall, initial encounter: Secondary | ICD-10-CM | POA: Diagnosis not present

## 2022-03-28 MED ORDER — MORPHINE SULFATE (PF) 4 MG/ML IV SOLN
4.0000 mg | Freq: Once | INTRAVENOUS | Status: AC
  Administered 2022-03-28: 4 mg via INTRAVENOUS
  Filled 2022-03-28: qty 1

## 2022-03-28 MED ORDER — PROPOFOL 10 MG/ML IV BOLUS
1.0000 mg/kg | Freq: Once | INTRAVENOUS | Status: AC
  Administered 2022-03-28: 74.8 mg via INTRAVENOUS
  Filled 2022-03-28: qty 20

## 2022-03-28 MED ORDER — LORAZEPAM 2 MG/ML IJ SOLN
1.0000 mg | INTRAMUSCULAR | Status: DC | PRN
Start: 2022-03-28 — End: 2022-03-29
  Administered 2022-03-28: 1 mg via INTRAVENOUS
  Filled 2022-03-28: qty 1

## 2022-03-28 MED ORDER — OXYCODONE-ACETAMINOPHEN 5-325 MG PO TABS
1.0000 | ORAL_TABLET | Freq: Once | ORAL | Status: AC
  Administered 2022-03-29: 1 via ORAL
  Filled 2022-03-28: qty 1

## 2022-03-28 MED ORDER — OXYCODONE-ACETAMINOPHEN 5-325 MG PO TABS
1.0000 | ORAL_TABLET | Freq: Once | ORAL | Status: AC
  Administered 2022-03-28: 1 via ORAL
  Filled 2022-03-28: qty 1

## 2022-03-28 MED ORDER — OXYCODONE-ACETAMINOPHEN 5-325 MG PO TABS: 1.0000 | ORAL_TABLET | ORAL | 0 refills | Status: AC | PRN

## 2022-03-28 NOTE — ED Provider Triage Note (Signed)
Emergency Medicine Provider Triage Evaluation Note ? ?Austin Dillon , a 71 y.o. male  was evaluated in triage.  Pt complains of left shoulder dislocation about 2 weeks ago.  Thinks it still dislocated.. ? ?Review of Systems  ?Positive: Shoulder pain ?Negative: Fever chills ? ?Physical Exam  ?There were no vitals taken for this visit. ?Gen:   Awake, no distress   ?Resp:  Normal effort  ?MSK:   Decreased range of motion left shoulder,  ?Other:   ? ?Medical Decision Making  ?Medically screening exam initiated at 4:59 PM.  Appropriate orders placed.  BODIN GORKA was informed that the remainder of the evaluation will be completed by another provider, this initial triage assessment does not replace that evaluation, and the importance of remaining in the ED until their evaluation is complete. ? ?X-ray left shoulder ?  ?Versie Starks, PA-C ?03/28/22 1700 ? ?

## 2022-03-28 NOTE — ED Notes (Signed)
MD called and notified pt wife, Imre Vecchione, of risks and benefits of a reduction of pt left shoulder. Pt wife verbalized understanding procedure as well as the risks and benefits up to including pt needing surgery for the repair of his left shoulder. ?

## 2022-03-28 NOTE — ED Triage Notes (Signed)
Pt to ED from [facility] for L shoulder dislocation, pt states it may have been dislocated for about 2 weeks since he was pulling on door while fixing door. . Pt went to [facility] today and xray shows dislocation.  ? ?Pt states pain is 5/10 at rest and 10/10 with upward movement of L arm. Ambulatory to triage room. ?

## 2022-03-28 NOTE — ED Provider Notes (Signed)
? ?[facility] ?Provider Note ? ? ? Event Date/Time  ? First MD Initiated Contact with Patient 03/28/22 1738   ?  (approximate) ? ? ?History  ? ?Chief Complaint ?Shoulder Injury ? ? ?HPI ? ?Austin Dillon is a 71 y.o. 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  Patient reports that he lost his balance and started to fall about 2 weeks ago when he grabbed a door in order to stabilize himself.  He reports feeling a pop in his left shoulder with immediate onset of pain at that time.  He has had difficulty lifting up his left arm since that injury, denies any pain in his elbow or wrist.  He made an appointment to be seen at [facility] clinic, was subsequently seen earlier today and diagnosed with dislocation of left shoulder.  Reduction was apparently attempted in the office but unsuccessful.  He was sent to the ED for further evaluation. ?  ? ? ?Physical Exam  ? ?Triage Vital Signs: ?ED Triage Vitals  ?Enc Vitals Group  ?   BP 03/28/22 1703 137/87  ?   Pulse Rate 03/28/22 1703 66  ?   Resp 03/28/22 1703 16  ?   Temp 03/28/22 1703 98.4 ?F (36.9 ?C)  ?   Temp Source 03/28/22 1703 Oral  ?   SpO2 03/28/22 1703 95 %  ?   Weight 03/28/22 1700 165 lb (74.8 kg)  ?   Height 03/28/22 1700 '5\' 8"'$  (1.727 m)  ?   Head Circumference --   ?   Peak Flow --   ?   Pain Score 03/28/22 1700 5  ?   Pain Loc --   ?   Pain Edu? --   ?   Excl. in [place]? --   ? ? ?Most recent vital signs: ?Vitals:  ? 03/28/22 1913 03/28/22 1915  ?BP: (!) 158/100 (!) 144/96  ?Pulse: 81 80  ?Resp: (!) 23 19  ?Temp:    ?SpO2: 94% 97%  ? ? ?Constitutional: Awake and alert. ?Eyes: Conjunctivae are normal. ?Head: Atraumatic. ?Nose: No congestion/rhinnorhea. ?Mouth/Throat: Mucous membranes are moist.  ?Cardiovascular: Normal rate, regular rhythm. Grossly normal heart sounds.  2+ radial pulses  bilaterally. ?Respiratory: Normal respiratory effort.  No retractions. Lungs CTAB. ?Gastrointestinal: Soft and nontender. No distention. ?Musculoskeletal: No lower extremity tenderness nor edema.  Obvious deformity to left shoulder with mild tenderness to palpation.  Range of motion at left shoulder limited, no tenderness to palpation noted at left elbow or wrist. ?Neurologic:  Normal speech and language. No gross focal neurologic deficits are appreciated. ? ? ? ?ED Results / Procedures / Treatments  ? ?Labs ?(all labs ordered are listed, but only abnormal results are displayed) ?Labs Reviewed - No data to display ? ?RADIOLOGY ?Left shoulder x-ray reviewed by me and consistent with anterior dislocation, no evidence of fracture. ? ?PROCEDURES: ? ?Critical Care performed: No ? ?.Sedation ? ?Date/Time: 03/28/2022 10:52 PM ?Performed by: Blake Divine, MD ?Authorized by: Blake Divine, MD  ? ?Consent:  ?  Consent obtained:  Verbal ?  Consent given by:  Spouse ?  Risks discussed:  Allergic reaction, prolonged hypoxia resulting in organ damage, prolonged sedation necessitating reversal, respiratory compromise necessitating ventilatory assistance and intubation, vomiting, nausea, inadequate sedation and dysrhythmia ?  Alternatives discussed:  Analgesia without sedation ?Universal protocol:  ?  Immediately prior to  procedure, a time out was called: yes   ?  Patient identity confirmed:  Arm band and verbally with patient ?Indications:  ?  Procedure performed:  Dislocation reduction ?  Procedure necessitating sedation performed by:  Physician performing sedation ?Pre-sedation assessment:  ?  Time since last food or drink:  6 ?  ASA classification: class 2 - patient with mild systemic disease   ?  Mouth opening:  2 finger widths ?  Thyromental distance:  3 finger widths ?  Mallampati score:  II - soft palate, uvula, fauces visible ?  Neck mobility: normal   ?  Pre-sedation assessments completed and reviewed: airway patency,  cardiovascular function, hydration status, mental status, nausea/vomiting, pain level, respiratory function and temperature   ?  Pre-sedation assessment completed:  03/28/2022 6:45 PM ?Immediate pre-procedure details:  ?  Reassessment: Patient reassessed immediately prior to procedure   ?  Reviewed: vital signs, relevant labs/tests and NPO status   ?  Verified: bag valve mask available, emergency equipment available, intubation equipment available, IV patency confirmed, oxygen available and suction available   ?Procedure details (see MAR for exact dosages):  ?  Preoxygenation:  Room air ?  Sedation:  Propofol ?  Intended level of sedation: deep ?  Analgesia:  None ?  Intra-procedure monitoring:  Blood pressure monitoring, cardiac monitor, continuous pulse oximetry, frequent vital sign checks, frequent LOC assessments and continuous capnometry ?  Intra-procedure events: none   ?  Total Provider sedation time (minutes):  10 ?Post-procedure details:  ?  Post-sedation assessment completed:  03/28/2022 7:35 PM ?  Attendance: Constant attendance by certified staff until patient recovered   ?  Recovery: Patient returned to pre-procedure baseline   ?  Post-sedation assessments completed and reviewed: airway patency, cardiovascular function, hydration status, mental status, nausea/vomiting, pain level, respiratory function and temperature   ?  Patient is stable for discharge or admission: yes   ?  Procedure completion:  Tolerated well, no immediate complications ?Mar[facility] KitchenOrtho Injury Treatment ? ?Date/Time: 03/28/2022 10:56 PM ?Performed by: Blake Divine, MD ?Authorized by: Blake Divine, MD  ? ?Consent:  ?  Consent obtained:  Verbal ?  Consent given by:  Spouse ?  Risks discussed:  Vascular damage, restricted joint movement, stiffness, recurrent dislocation, nerve damage, fracture and irreducible dislocation ?  Alternatives discussed:  Referral and immobilizationInjury location: shoulder ?Location details: left shoulder ?Injury  type: dislocation ?Dislocation type: anterior ?Hill-Sachs deformity: yes ?Chronicity: recurrent ?Pre-procedure neurovascular assessment: neurovascularly intact ?Pre-procedure distal perfusion: normal ?Pre-procedure neurological function: normal ?Pre-procedure range of motion: reduced ? ?Anesthesia: ?Local anesthesia used: no ? ?Patient sedated: Yes. Refer to sedation procedure documentation for details of sedation. ?Manipulation performed: yes ?Reduction method: traction and counter traction ?Reduction successful: yes ?X-ray confirmed reduction: yes ?Immobilization: sling and brace ?Post-procedure neurovascular assessment: post-procedure neurovascularly intact ?Post-procedure distal perfusion: normal ?Post-procedure neurological function: normal ?Post-procedure range of motion: improved ? ? ? ? ?MEDICATIONS ORDERED IN ED: ?Medications  ?LORazepam (ATIVAN) injection 1 mg (1 mg Intravenous Given 03/28/22 2254)  ?propofol (DIPRIVAN) 10 mg/mL bolus/IV push 74.8 mg (74.8 mg Intravenous Given 03/28/22 1904)  ?morphine (PF) 4 MG/ML injection 4 mg (4 mg Intravenous Given 03/28/22 2038)  ?oxyCODONE-acetaminophen (PERCOCET/ROXICET) 5-325 MG per tablet 1 tablet (1 tablet Oral Given 03/28/22 2219)  ? ? ? ?IMPRESSION / MDM / ASSESSMENT AND PLAN / ED COURSE  ?I reviewed the triage vital signs and the nursing notes. ?             ?               ? ?  71 y.o. 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 ? ?Differential diagnosis includes, but is not limited to, shoulder dislocation, shoulder fracture, neurovascular injury. ? ?Patient nontoxic-appearing and in no acute distress, vital signs are unremarkable and he is neurovascular intact to his distal left upper extremity.  X-ray is consistent with anterior dislocation  with no evidence of fracture.  Patient appears to have had a dislocated shoulder for about 2 weeks and he will require conscious sedation for attempt at reduction here in the ED.  Given patient's dementia, ri

## 2022-03-29 NOTE — ED Notes (Signed)
Spouse called and in route to pick up pt for discharge.  ?

## 2022-05-20 ENCOUNTER — Emergency Department
Admission: EM | Admit: 2022-05-20 | Discharge: 2022-05-20 | Disposition: A | Discharge status: Home / Self Care | Payer: Medicare Other | Attending: Emergency Medicine | Admitting: Emergency Medicine

## 2022-05-20 ENCOUNTER — Emergency Department: Payer: Medicare Other

## 2022-05-20 ENCOUNTER — Encounter: Payer: Self-pay | Admitting: Intensive Care

## 2022-05-20 ENCOUNTER — Other Ambulatory Visit: Payer: Self-pay

## 2022-05-20 DIAGNOSIS — M24412 Recurrent dislocation, left shoulder: Secondary | ICD-10-CM

## 2022-05-20 DIAGNOSIS — W1839XA Other fall on same level, initial encounter: Secondary | ICD-10-CM | POA: Diagnosis not present

## 2022-05-20 DIAGNOSIS — W19XXXA Unspecified fall, initial encounter: Secondary | ICD-10-CM

## 2022-05-20 DIAGNOSIS — S4992XA Unspecified injury of left shoulder and upper arm, initial encounter: Secondary | ICD-10-CM | POA: Diagnosis present

## 2022-05-20 MED ORDER — HYDROCODONE-ACETAMINOPHEN 5-325 MG PO TABS: 1.0000 | ORAL_TABLET | ORAL | 0 refills | Status: AC | PRN

## 2022-05-20 MED ORDER — PROPOFOL 10 MG/ML IV BOLUS
INTRAVENOUS | Status: AC | PRN
  Administered 2022-05-20 (×6): 31.8 mg via INTRAVENOUS

## 2022-05-20 MED ORDER — HYDROCODONE-ACETAMINOPHEN 5-325 MG PO TABS
1.0000 | ORAL_TABLET | Freq: Once | ORAL | Status: AC
  Administered 2022-05-20: 1 via ORAL
  Filled 2022-05-20: qty 1

## 2022-05-20 MED ORDER — PROPOFOL 10 MG/ML IV BOLUS
INTRAVENOUS | Status: AC
  Filled 2022-05-20: qty 20

## 2022-05-20 MED ORDER — PROPOFOL 10 MG/ML IV BOLUS
0.5000 mg/kg | Freq: Once | INTRAVENOUS | Status: AC
  Administered 2022-05-20: 31.8 mg via INTRAVENOUS
  Filled 2022-05-20: qty 20

## 2022-05-20 MED ORDER — PROPOFOL 10 MG/ML IV BOLUS: 1.0000 mg/kg | Freq: Once | INTRAVENOUS | Status: DC

## 2022-05-20 NOTE — ED Triage Notes (Signed)
Patient presents with left shoulder pain and after getting his leg caught on chair Sunday and falling. Denies LOC or hitting head

## 2022-05-20 NOTE — ED Notes (Signed)
Paper consent obtained for procedural sedation/ left shoulder reduction. Paper placed in shadow chart.

## 2022-05-20 NOTE — ED Provider Notes (Signed)
[facility] Provider Note   Event Date/Time   First MD Initiated Contact with Patient 05/20/22 1106     (approximate) History  Shoulder Injury and Fall  HPI Austin Dillon is a 71 y.o. male  Location: Left shoulder Duration: 2 days prior to arrival Timing: Stable since onset Severity: 10/10 Quality: Aching pain Context: Patient had a mechanical fall from standing onto this left shoulder and felt a pop.  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 Modifying factors: Any movement at the left arm worsens this pain is partially relieved at rest Associated Symptoms: Denies ROS: Patient currently denies any vision changes, tinnitus, difficulty speaking, facial droop, sore throat, chest pain, shortness of breath, abdominal pain, nausea/vomiting/diarrhea, dysuria, or weakness/numbness/paresthesias in any extremity   Physical Exam  Triage Vital Signs: ED Triage Vitals  Enc Vitals Group     BP 05/20/22 1100 129/87     Pulse Rate 05/20/22 1100 (!) 102     Resp 05/20/22 1100 18     Temp 05/20/22 1100 98.8 F (37.1 C)     Temp Source 05/20/22 1100 Oral     SpO2 05/20/22 1100 95 %     Weight 05/20/22 1101 140 lb (63.5 kg)     Height 05/20/22 1101 '5\' 8"'$  (1.727 m)     Head Circumference --      Peak Flow --      Pain Score 05/20/22 1100 10     Pain Loc --      Pain Edu? --      Excl. in [place]? --    Most recent vital signs: Vitals:   05/20/22 1325 05/20/22 1330  BP: 122/84 (!) 142/77  Pulse: 82 88  Resp: (!) 23 (!) 23  Temp:    SpO2: 99% 99%   General: Awake, cooperative CV:  Good peripheral perfusion.  Resp:  Normal effort.  Abd:  No distention.  Other:  Elderly Caucasian male laying in bed in no acute distress.  Obvious deformity noted to the left shoulder ED Results / Procedures / Treatments   RADIOLOGY ED MD interpretation: Left shoulder x-ray performed on 05/19/2022 at [facility] clinic interpreted by me and shows anterior  dislocation of the left shoulder  X-ray of the left shoulder 05/20/2022 shows no acute findings with a chronic Hill-Sachs deformity of the humeral head.  This is likely evidence of dislocation reduction -Agree with radiology assessment Official radiology report(s): DG Shoulder Left  Result Date: 05/20/2022 CLINICAL DATA:  Fall.  Left shoulder injury and pain. EXAM: LEFT SHOULDER - 2+ VIEW COMPARISON:  03/28/2022 FINDINGS: There is no evidence of acute fracture or dislocation. A chronic Hill-Sachs deformity is seen in the posterosuperior aspect of the humeral head, consistent previous shoulder dislocation. Soft tissues are unremarkable. IMPRESSION: No acute findings. 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 Electronically Signed   By: Marlaine Hind M.D.   On: 05/20/2022 14:02   PROCEDURES: Critical Care performed: No .Ortho Injury Treatment  Date/Time: 05/20/2022 2:49 PM  Performed by: Naaman Plummer, MD Authorized by: Naaman Plummer, MD   Consent:    Consent obtained:  Verbal and written   Consent given by:  Patient   Risks discussed:  Recurrent dislocation   Alternatives discussed:  No treatment and alternative treatmentInjury location: shoulder Location details: left shoulder Injury type: dislocation Dislocation type: anterior Hill-Sachs deformity: yes Chronicity: recurrent Pre-procedure neurovascular assessment: neurovascularly intact Pre-procedure distal perfusion: normal Pre-procedure neurological function: normal Pre-procedure range  of motion: reduced  Anesthesia: Local anesthesia used: no  Patient sedated: Yes. Refer to sedation procedure documentation for details of sedation. Manipulation performed: yes Reduction method: external rotation Reduction successful: yes X-ray confirmed reduction: yes Immobilization: sling Splint Applied by: ED Provider Post-procedure neurovascular assessment: post-procedure neurovascularly intact Post-procedure distal perfusion:  normal Post-procedure neurological function: normal Post-procedure range of motion: normal   .Sedation  Date/Time: 05/20/2022 2:51 PM  Performed by: Naaman Plummer, MD Authorized by: Naaman Plummer, MD   Consent:    Consent obtained:  Written   Consent given by:  Patient and spouse   Risks discussed:  Allergic reaction, prolonged hypoxia resulting in organ damage, dysrhythmia, prolonged sedation necessitating reversal, inadequate sedation, respiratory compromise necessitating ventilatory assistance and intubation, nausea and vomiting   Alternatives discussed:  Analgesia without sedation, anxiolysis and regional anesthesia Universal protocol:    Immediately prior to procedure, a time out was called: yes   Indications:    Procedure performed:  Dislocation reduction   Procedure necessitating sedation performed by:  Physician performing sedation Pre-sedation assessment:    Time since last food or drink:  Unknown   NPO status caution: unable to specify NPO status and urgency dictates proceeding with non-ideal NPO status     ASA classification: class 2 - patient with mild systemic disease     Mouth opening:  2 finger widths   Thyromental distance:  3 finger widths   Mallampati score:  II - soft palate, uvula, fauces visible   Neck mobility: reduced     Pre-sedation assessments completed and reviewed: airway patency, cardiovascular function, hydration status, mental status, nausea/vomiting, pain level, respiratory function and temperature   Immediate pre-procedure details:    Reassessment: Patient reassessed immediately prior to procedure     Reviewed: vital signs and relevant labs/tests     Verified: bag valve mask available, emergency equipment available, intubation equipment available, IV patency confirmed, oxygen available, reversal medications available and suction available   Procedure details (see MAR for exact dosages):    Preoxygenation:  Nasal cannula   Sedation:  Propofol    Intended level of sedation: deep   Analgesia:  None   Intra-procedure monitoring:  Blood pressure monitoring, continuous capnometry, frequent LOC assessments, frequent vital sign checks, continuous pulse oximetry and cardiac monitor   Intra-procedure events: none     Total Provider sedation time (minutes):  31 Post-procedure details:    Attendance: Constant attendance by certified staff until patient recovered     Recovery: Patient returned to pre-procedure baseline     Post-sedation assessments completed and reviewed: airway patency, cardiovascular function, hydration status, mental status, nausea/vomiting, pain level, respiratory function and temperature     Patient is stable for discharge or admission: yes     Procedure completion:  Tolerated well, no immediate complications .1-3 Lead EKG Interpretation  Performed by: Naaman Plummer, MD Authorized by: Naaman Plummer, MD     Interpretation: normal     ECG rate:  87   ECG rate assessment: normal     Rhythm: sinus rhythm     Ectopy: none     Conduction: normal    MEDICATIONS ORDERED IN ED: Medications  HYDROcodone-acetaminophen (NORCO/VICODIN) 5-325 MG per tablet 1 tablet (has no administration in time range)  propofol (DIPRIVAN) 10 mg/mL bolus/IV push 31.8 mg (31.8 mg Intravenous Given by Other 05/20/22 1304)  propofol (DIPRIVAN) 10 mg/mL bolus/IV push (31.8 mg Intravenous Given 05/20/22 1322)  HYDROcodone-acetaminophen (NORCO/VICODIN) 5-325 MG per tablet 1 tablet (  1 tablet Oral Given 05/20/22 1345)   IMPRESSION / MDM / ASSESSMENT AND PLAN / ED COURSE  I reviewed the triage vital signs and the nursing notes.                             The patient is on the cardiac monitor to evaluate for evidence of arrhythmia and/or significant heart rate changes. Patient's presentation is most consistent with acute presentation with potential threat to life or bodily function. Workup: XR Shoulder Findings: Dislocation  Patient does not  currently demonstrate complications of dislocation such as compartment syndrome, arterial injury or nerve injury. The dislocation has been satisfactorily reduced and immobilized, and the patient has been given appropriate analgesia. Rx: sling immobilization 1 week, with gentle ROM to follow Disposition: Discharge with strict return precautions and instructions to follow up with primary MD within 48 hours for further evaluation including referral to an orthopedist.   FINAL CLINICAL IMPRESSION(S) / ED DIAGNOSES   Final diagnoses:  Recurrent anterior dislocation of left shoulder  Fall, initial encounter   Rx / DC Orders   ED Discharge Orders          Ordered    HYDROcodone-acetaminophen (NORCO) 5-325 MG tablet  Every 4 hours PRN        05/20/22 1422           Note:  This document was prepared using Dragon voice recognition software and may include unintentional dictation errors.   Naaman Plummer, MD 05/20/22 [phone]

## 2022-10-07 ENCOUNTER — Other Ambulatory Visit: Payer: Self-pay | Admitting: Urology

## 2022-10-26 ENCOUNTER — Ambulatory Visit: Payer: No Typology Code available for payment source | Admitting: Urology

## 2022-11-09 ENCOUNTER — Ambulatory Visit: Payer: Medicare Other | Admitting: Urology

## 2022-12-29 ENCOUNTER — Other Ambulatory Visit: Payer: Self-pay | Admitting: Student

## 2022-12-29 DIAGNOSIS — F02818 Dementia in other diseases classified elsewhere, unspecified severity, with other behavioral disturbance: Secondary | ICD-10-CM

## 2022-12-29 DIAGNOSIS — R292 Abnormal reflex: Secondary | ICD-10-CM

## 2022-12-29 DIAGNOSIS — F0280 Dementia in other diseases classified elsewhere without behavioral disturbance: Secondary | ICD-10-CM

## 2023-02-22 ENCOUNTER — Ambulatory Visit: Admission: RE | Admit: 2023-02-22 | Payer: Medicare Other | Source: Ambulatory Visit

## 2023-03-08 ENCOUNTER — Ambulatory Visit
Admission: RE | Admit: 2023-03-08 | Discharge: 2023-03-08 | Disposition: A | Discharge status: Home / Self Care | Payer: Medicare Other | Source: Ambulatory Visit | Attending: Student | Admitting: Student

## 2023-03-08 DIAGNOSIS — R292 Abnormal reflex: Secondary | ICD-10-CM | POA: Diagnosis not present

## 2023-03-08 DIAGNOSIS — G300 Alzheimer's disease with early onset: Secondary | ICD-10-CM | POA: Insufficient documentation

## 2023-03-08 DIAGNOSIS — F02818 Dementia in other diseases classified elsewhere, unspecified severity, with other behavioral disturbance: Secondary | ICD-10-CM | POA: Diagnosis present

## 2023-03-08 DIAGNOSIS — G309 Alzheimer's disease, unspecified: Secondary | ICD-10-CM | POA: Diagnosis present

## 2023-03-08 DIAGNOSIS — F0280 Dementia in other diseases classified elsewhere without behavioral disturbance: Secondary | ICD-10-CM | POA: Insufficient documentation

## 2023-05-10 DIAGNOSIS — K219 Gastro-esophageal reflux disease without esophagitis: Secondary | ICD-10-CM | POA: Insufficient documentation

## 2023-05-10 DIAGNOSIS — E782 Mixed hyperlipidemia: Secondary | ICD-10-CM | POA: Insufficient documentation

## 2023-05-10 DIAGNOSIS — B359 Dermatophytosis, unspecified: Secondary | ICD-10-CM | POA: Insufficient documentation

## 2023-10-18 ENCOUNTER — Ambulatory Visit: Payer: No Typology Code available for payment source | Admitting: Dermatology

## 2023-10-19 ENCOUNTER — Ambulatory Visit: Payer: No Typology Code available for payment source | Admitting: Dermatology

## 2023-11-01 ENCOUNTER — Ambulatory Visit: Payer: No Typology Code available for payment source | Admitting: Dermatology

## 2023-11-01 ENCOUNTER — Encounter: Payer: Self-pay | Admitting: Dermatology

## 2023-11-01 ENCOUNTER — Ambulatory Visit (INDEPENDENT_AMBULATORY_CARE_PROVIDER_SITE_OTHER): Payer: No Typology Code available for payment source | Admitting: Dermatology

## 2023-11-01 VITALS — BP 118/76 | HR 86

## 2023-11-01 DIAGNOSIS — D492 Neoplasm of unspecified behavior of bone, soft tissue, and skin: Secondary | ICD-10-CM | POA: Diagnosis not present

## 2023-11-01 DIAGNOSIS — G43909 Migraine, unspecified, not intractable, without status migrainosus: Secondary | ICD-10-CM | POA: Insufficient documentation

## 2023-11-01 DIAGNOSIS — Z0289 Encounter for other administrative examinations: Secondary | ICD-10-CM | POA: Insufficient documentation

## 2023-11-01 DIAGNOSIS — D0439 Carcinoma in situ of skin of other parts of face: Secondary | ICD-10-CM | POA: Diagnosis not present

## 2023-11-01 DIAGNOSIS — F1320 Sedative, hypnotic or anxiolytic dependence, uncomplicated: Secondary | ICD-10-CM | POA: Insufficient documentation

## 2023-11-01 DIAGNOSIS — S88112A Complete traumatic amputation at level between knee and ankle, left lower leg, initial encounter: Secondary | ICD-10-CM | POA: Insufficient documentation

## 2023-11-01 DIAGNOSIS — D485 Neoplasm of uncertain behavior of skin: Secondary | ICD-10-CM | POA: Insufficient documentation

## 2023-11-01 NOTE — Progress Notes (Signed)
   New Patient Visit   Subjective  Austin Dillon is a 72 y.o. male who presents for the following: Spot Check  Patient states he  has spot located at the face that he  would like to have examined. Patient reports the areas have been there for 1 year. He reports the areas are bothersome.Patient rates irritation 0 out of 10. He states that the areas have spread. Patient reports he  has not previously been treated for these areas. Patient denies Hx of bx. Patient denies family history of skin cancer(s).  The patient has spots, moles and lesions to be evaluated, some may be new or changing.  Patient is accompanied by his wife.  The following portions of the chart were reviewed this encounter and updated as appropriate: medications, allergies, medical history  Review of Systems:  No other skin or systemic complaints except as noted in HPI or Assessment and Plan.  Objective  Well appearing patient in no apparent distress; mood and affect are within normal limits.   A focused examination was performed of the following areas: Face (Right Buccal Cheek)  Relevant exam findings are noted in the Assessment and Plan.  Right Buccal Cheek 1.4 cm hyperkeratotic plaque       Assessment & Plan    Neoplasm of uncertain behavior of skin Right Buccal Cheek  Skin / nail biopsy Type of biopsy: tangential   Informed consent: discussed and consent obtained   Timeout: patient name, date of birth, surgical site, and procedure verified   Procedure prep:  Patient was prepped and draped in usual sterile fashion Anesthesia: the lesion was anesthetized in a standard fashion   Anesthetic:  1% lidocaine w/ epinephrine 1-100,000 local infiltration Instrument used: DermaBlade   Hemostasis achieved with: pressure and aluminum chloride   Outcome: patient tolerated procedure well   Post-procedure details: wound care instructions given    Specimen 1 - Surgical pathology Differential Diagnosis: Scc vs  ISK  Check Margins: No   Return for based on biopsy results..   Documentation: I have reviewed the above documentation for accuracy and completeness, and I agree with the above.  I, Nell Range, am acting as scribe for Gwenith Daily, MD.   Gwenith Daily, MD

## 2023-11-01 NOTE — Patient Instructions (Signed)

## 2023-11-06 LAB — SURGICAL PATHOLOGY

## 2023-11-08 ENCOUNTER — Telehealth: Payer: Self-pay

## 2023-11-08 NOTE — Telephone Encounter (Signed)
Spoke with H[employer] wife and gave her bx results and treatment recommendations

## 2023-11-08 NOTE — Telephone Encounter (Signed)
-----   Message from [facility] PACI sent at 11/06/2023  6:34 PM EST ----- 1. 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

## 2023-11-20 ENCOUNTER — Ambulatory Visit: Payer: No Typology Code available for payment source | Admitting: Dermatology

## 2023-11-20 ENCOUNTER — Encounter: Payer: Self-pay | Admitting: Dermatology

## 2023-11-20 ENCOUNTER — Ambulatory Visit: Payer: Medicare Other | Admitting: Dermatology

## 2023-11-20 VITALS — BP 122/72 | HR 91 | Temp 98.9°F

## 2023-11-20 DIAGNOSIS — L579 Skin changes due to chronic exposure to nonionizing radiation, unspecified: Secondary | ICD-10-CM

## 2023-11-20 DIAGNOSIS — C44320 Squamous cell carcinoma of skin of unspecified parts of face: Secondary | ICD-10-CM | POA: Diagnosis not present

## 2023-11-20 DIAGNOSIS — C44329 Squamous cell carcinoma of skin of other parts of face: Secondary | ICD-10-CM | POA: Diagnosis not present

## 2023-11-20 DIAGNOSIS — L814 Other melanin hyperpigmentation: Secondary | ICD-10-CM

## 2023-11-20 DIAGNOSIS — C4492 Squamous cell carcinoma of skin, unspecified: Secondary | ICD-10-CM

## 2023-11-20 NOTE — Patient Instructions (Signed)

## 2023-11-20 NOTE — Progress Notes (Signed)
Follow-Up Visit   Subjective  Austin Dillon is a 72 y.o. 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 Caralyn Guile. Patient's wife is accompanying the patient.   The following portions of the chart were reviewed this encounter and updated as appropriate: medications, allergies, medical history  Review of Systems:  No other skin or systemic complaints except as noted in HPI or Assessment and Plan.  Objective  Well appearing patient in no apparent distress; mood and affect are within normal limits.  A focused examination was performed of the following areas: Right buccal cheek Relevant physical exam findings are noted in the Assessment and Plan.     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 No   Was the anticoagulation regimen changed prior to Mohs? No    Procedure Details: Timeout: pre-procedure verification complete Procedure Prep: patient was prepped and draped in usual sterile fashion Pre-Op diagnosis: squamous cell carcinoma Surgical site (from skin exam): Right Buccal Cheek Pre-operative length (cm): 1.2 Pre-operative width (cm): 1.1  Micrographic Surgery Details: Post-operative length (cm): 1.6 Post-operative width (cm): 1.4 Number of Mohs stages: 1  Skin repair Complexity:  Complex Final length (cm):  5 Timeout: patient name, date of birth, surgical site, and procedure verified   Procedure prep:  Patient was prepped and draped in usual sterile fashion Prep type:  Chlorhexidine Anesthesia: the lesion was anesthetized in a standard fashion   Anesthetic:  1% lidocaine w/ epinephrine 1-100,000 buffered w/ 8.4% NaHCO3 Reason for type of repair: reduce tension to allow closure, preserve normal anatomy, preserve normal  anatomical and functional relationships, avoid adjacent structures and allow side-to-side closure without requiring a flap or graft   Subcutaneous layers (deep stitches):  Suture size:  5-0 Suture type: Monocryl (poliglecaprone 25)   Stitches:  Buried vertical mattress Fine/surface layer approximation (top stitches):  Suture size:  6-0 Suture type: fast-absorbing plain gut   Stitches: simple running   Hemostasis achieved with: suture, pressure and electrodesiccation Outcome: patient tolerated procedure well with no complications   Post-procedure details: sterile dressing applied and wound care instructions given   Dressing type: pressure dressing and bacitracin     Return in about 4 weeks (around 12/18/2023) for wound che3ck.  Owens Shark, CMA, am acting as scribe for Gwenith Daily, MD.    11/20/2023  HISTORY OF PRESENT ILLNESS  Austin Dillon is seen in consultation at the request of Dr. Caralyn Guile for biopsy-proven Squamous Cell Carcinoma in Situ. They note that the area has been present for about 6 months increasing in size with time.  There is no history of previous treatment.  Reports no other new or changing lesions and has no other complaints today.  Medications and allergies: see patient chart.  Review of systems: Reviewed 8 systems and notable for the above skin cancer.  All other systems reviewed are unremarkable/negative, unless noted in the HPI. Past medical history, surgical history, family history, social history were also reviewed and are noted in the chart/questionnaire.    PHYSICAL EXAMINATION  General: Well-appearing, in no acute distress, alert and oriented x 4. Vitals reviewed in chart (if available).   Skin: Exam reveals a 1.2 x 1.1 cm erythematous papule and biopsy scar on the right cheek. There are rhytids, telangiectasias, and lentigines, consistent with  photodamage.   Biopsy report(s) reviewed, confirming the diagnosis.   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 Due to location, size, histology, or recurrence and the likelihood of subclinical extension as well as the need to conserve normal surrounding tissue, the patient was deemed acceptable for Mohs micrographic surgery (MMS).  The nature and purpose of the procedure, associated benefits and risks including recurrence and scarring, possible complications such as pain, infection, and bleeding, and alternative methods of treatment if appropriate were discussed with the patient during consent. The lesion location was verified by the patient, by reviewing previous notes, pathology reports, and by photographs as well as angulation measurements if available.  Informed consent was reviewed and signed by the patient, and timeout was performed at 9:30 AM. See op note below.  2. For the photodamage and solar lentigines, sun protection discussed/information given on OTC sunscreens, and we recommend continued regular follow-up with primary dermatologist every 6 months or sooner for any growing, bleeding, or changing lesions. 3. Prognosis and future surveillance discussed. 4. Letter with treatment outcome sent to referring provider. 5. Pain acetaminophen/ibuprofen   MOHS MICROGRAPHIC SURGERY AND RECONSTRUCTION  Initial size:   1.2 x 1.1 cm Surgical defect/wound size: 1.6 x 1.4 cm Anesthesia:    0.33% lidocaine with 1:200,000 epinephrine EBL:    <5 mL Complications:  None Repair type:   Complex SQ suture:   5-0 Monocryl Cutaneous suture:  6-0 Plain gut Final size of the repair: 5.0 cm  Stages: 1  STAGE I: Anesthesia achieved with 0.5% lidocaine with 1:200,000 epinephrine. ChloraPrep applied. 1 section(s) excised using Mohs technique (this includes total peripheral and deep tissue margin excision and evaluation with frozen sections, excised and interpreted by the same physician). The tumor was first debulked and then excised  with an approx. 2mm margin.  Hemostasis was achieved with electrocautery as needed.  The specimen was then oriented, subdivided/relaxed, inked, and processed using Mohs technique.    Frozen section analysis revealed a clear deep and peripheral margin.  Reconstruction  The surgical wound was then cleaned, prepped, and re-anesthetized as above. Wound edges were undermined extensively along at least one entire edge and at a distance equal to or greater than the width of the defect (see wound defect size above) in order to achieve closure and decrease wound tension and anatomic distortion. Redundant tissue repair including standing cone removal was performed. Hemostasis was achieved with electrocautery. Subcutaneous and epidermal tissues were approximated with the above sutures. The surgical site was then lightly scrubbed with sterile, saline-soaked gauze. Steri-strips were applied, and the area was then bandaged using Vaseline ointment, non-adherent gauze, gauze pads, and tape to provide an adequate pressure dressing. The patient tolerated the procedure well, was given detailed written and verbal wound care instructions, and was discharged in good condition.   The patient will follow-up: 4 weeks.  Documentation: I have reviewed the above documentation for accuracy and completeness, and I agree with the above.  Gwenith Daily, MD

## 2023-11-23 ENCOUNTER — Encounter: Payer: Self-pay | Admitting: Dermatology

## 2023-12-18 ENCOUNTER — Ambulatory Visit: Payer: No Typology Code available for payment source | Admitting: Dermatology

## 2023-12-28 ENCOUNTER — Ambulatory Visit: Payer: Medicare Other | Admitting: Dermatology

## 2023-12-28 ENCOUNTER — Encounter: Payer: Self-pay | Admitting: Dermatology

## 2023-12-28 VITALS — BP 144/85 | HR 100

## 2023-12-28 DIAGNOSIS — L905 Scar conditions and fibrosis of skin: Secondary | ICD-10-CM

## 2023-12-28 DIAGNOSIS — C4492 Squamous cell carcinoma of skin, unspecified: Secondary | ICD-10-CM

## 2023-12-28 DIAGNOSIS — Z86007 Personal history of in-situ neoplasm of skin: Secondary | ICD-10-CM | POA: Diagnosis not present

## 2023-12-28 NOTE — Patient Instructions (Addendum)
Post-Operative Scar Care: Education and Recommendations  Following your procedure, it's important to care for your scar to promote optimal healing and minimize its appearance. Proper post-operative care can help ensure that the scar heals well, and with time, it may become less noticeable. Below are key recommendations for scar care, including scar massage and the use of silicone scar gels or sheets.  1. General Scar Care Tips: -  Keep the wound clean and dry: Follow your healthcare provider's instructions for wound care, including cleaning the site and changing dressings as needed. -  Avoid sun exposure: Direct sunlight can darken scars and make them more noticeable. Once your wound has healed, apply sunscreen (SPF 30 or higher) to protect the scar from UV rays.  2. Scar Massage: - Start after healing: Wait until the scar has fully healed, with no scabs or open areas (usually 4-6 weeks after surgery). Your healthcare provider will give you specific guidance on when to begin. - Technique: Gently massage the scar in a circular motion for 5-10 minutes, 2-3 times per day. This helps to soften the tissue, reduce swelling, and improve the overall appearance of the scar. - Pressure: Apply gentle, firm pressure during the massage to break down the dense tissue that may form during healing. This helps to prevent the formation of keloids or hypertrophic scars. - Use lotion or ointment: Consider using a mild, fragrance-free lotion or vitamin E ointment to help lubricate the area during massage.  3. Silicone Scar Gels or Sheets: - When to start: Once your wound has healed completely, typically around 4-6 weeks, you can begin using silicone-based scar gels or sheets. These have been shown to improve scar appearance by hydrating the tissue and reducing inflammation. - How to use silicone gels: Apply a thin layer of the gel to the scar and allow it to dry before covering with clothing. You can use the  gel multiple times a day, depending on your provider's recommendation. - How to use silicone sheets: Cut the sheet to fit the size of your scar, and apply it directly to the healed scar. Wear it for 12-24 hours a day, and replace the sheet every few days as directed. - Benefits: Silicone helps reduce redness, flatten the scar, and improve its texture. Continued use over several months can lead to significant improvement in the appearance of the scar.  4. What to Expect: - Healing process: Scars generally take time to mature. The first few months may show redness or swelling, but this usually improves as healing progresses. - Long-term care: Scarring is a natural part of the healing process. While you cannot completely eliminate a scar, proper care can significantly improve its appearance over time. - Patience: It can take up to a year for a scar to fully mature, so it's important to be consistent with scar care and follow-up appointments with your provider.  5. When to Contact Your Healthcare Provider: - If you notice signs of infection (increased redness, warmth, drainage, or pain). - If your scar becomes unusually raised, itchy, or changes in color significantly. - If you have concerns about the appearance of your scar or experience unusual symptoms. - By following these guidelines, you can support your body's natural healing process and help ensure the best possible outcome for your scar. If you have any questions or concerns, please don't hesitate to contact our office.   Important Information  Due to recent changes in healthcare laws, you may see results  of your pathology and/or laboratory studies on MyChart before the doctors have had a chance to review them. We understand that in some cases there may be results that are confusing or concerning to you. Please understand that not all results are received at the same time and often the doctors may need to interpret multiple results in order to  provide you with the best plan of care or course of treatment. Therefore, we ask that you please give [place] 2 business days to thoroughly review all your results before contacting the office for clarification. Should we see a critical lab result, you will be contacted sooner.   If You Need Anything After Your Visit  If you have any questions or concerns for your doctor, please call our main line at [phone] If no one answers, please leave a voicemail as directed and we will return your call as soon as possible. Messages left after 4 pm will be answered the following business day.   You may also send [place] a message via MyChart. We typically respond to MyChart messages within 1-2 business days.  For prescription refills, please ask your pharmacy to contact our office. Our fax number is [phone].  If you have an urgent issue when the clinic is closed that cannot wait until the next business day, you can page your doctor at the number below.    Please note that while we do our best to be available for urgent issues outside of office hours, we are not available 24/7.   If you have an urgent issue and are unable to reach [place], you may choose to seek medical care at your doctor's office, retail clinic, urgent care center, or emergency room.  If you have a medical emergency, please immediately call 911 or go to the emergency department. In the event of inclement weather, please call our main line at [phone] for an update on the status of any delays or closures.  Dermatology Medication Tips: Please keep the boxes that topical medications come in in order to help keep track of the instructions about where and how to use these. Pharmacies typically print the medication instructions only on the boxes and not directly on the medication tubes.   If your medication is too expensive, please contact our office at [phone] or send [place] a message through MyChart.   We are unable to tell what your  co-pay for medications will be in advance as this is different depending on your insurance coverage. However, we may be able to find a substitute medication at lower cost or fill out paperwork to get insurance to cover a needed medication.   If a prior authorization is required to get your medication covered by your insurance company, please allow [place] 1-2 business days to complete this process.  Drug prices often vary depending on where the prescription is filled and some pharmacies may offer cheaper prices.  The website www.goodrx.com contains coupons for medications through different pharmacies. The prices here do not account for what the cost may be with help from insurance (it may be cheaper with your insurance), but the website can give you the price if you did not use any insurance.  - You can print the associated coupon and take it with your prescription to the pharmacy.  - You may also stop by our office during regular business hours and pick up a GoodRx coupon card.  - If you need your prescription sent electronically to a different pharmacy, notify our office  through [facility] or by phone at [phone]    Skin Education :   I counseled the patient regarding the following: Sun screen (SPF 30 or greater) should be applied during peak UV exposure (between 10am and 2pm) and reapplied after exercise or swimming.  The ABCDEs of melanoma were reviewed with the patient, and the importance of monthly self-examination of moles was emphasized. Should any moles change in shape or color, or itch, bleed or burn, pt will contact our office for evaluation sooner then their interval appointment.  Plan: Sunscreen Recommendations I recommended a broad spectrum sunscreen with a SPF of 30 or higher. I explained that SPF 30 sunscreens block approximately 97 percent of the sun's harmful rays. Sunscreens should be applied at least 15 minutes prior to expected sun exposure and then every 2 hours  after that as long as sun exposure continues. If swimming or exercising sunscreen should be reapplied every 45 minutes to an hour after getting wet or sweating. One ounce, or the equivalent of a shot glass full of sunscreen, is adequate to protect the skin not covered by a bathing suit. I also recommended a lip balm with a sunscreen as well. Sun protective clothing can be used in lieu of sunscreen but must be worn the entire time you are exposed to the sun's rays. Wound Care Instructions for After Surgery  On the day following your surgery, you should begin doing daily dressing changes until your sutures are removed: Remove the bandage. Cleanse the wound gently with soap and water.  Make sure you then dry the skin surrounding the wound completely or the tape will not stick to the skin. Do not use cotton balls on the wound. After the wound is clean and dry, apply the ointment (either prescription antibiotic prescribed by your doctor or plain Vaseline if nothing was prescribed) gently with a Q-tip. If you are using a bandaid to cover: Apply a bandaid large enough to cover the entire wound. If you do not have a bandaid large enough to cover the wound OR if you are sensitive to bandaid adhesive: Cut a non-stick pad (such as Telfa) to fit the size of the wound.  Cover the wound with the non-stick pad. If the wound is draining, you may want to add a small amount of gauze on top of the non-stick pad for a little added compression to the area. Use tape to seal the area completely.  For the next 1-2 weeks: Be sure to keep the wound moist with ointment 24/7 to ensure best healing. If you are unable to cover the wound with a bandage to hold the ointment in place, you may need to reapply the ointment several times a day. Do not bend over or lift heavy items to reduce the chance of elevated blood pressure to the wound. Do not participate in particularly strenuous activities.  Below is a list of dressing  supplies you might need.  Cotton-tipped applicators - Q-tips Gauze pads (2x2 and/or 4x4) - All-Purpose Sponges New and clean tube of petroleum jelly (Vaseline) OR prescription antibiotic ointment if prescribed Either a bandaid large enough to cover the entire wound OR non-stick dressing material (Telfa) and Tape (Paper or Hypafix)  FOR ADULT SURGERY PATIENTS: If you need something for pain relief, you may take 1 extra strength Tylenol (acetaminophen) and 2 ibuprofen (200 mg) together every 4 hours as needed. (Do not take these medications if you are allergic to them or if you know you cannot  take them for any other reason). Typically you may only need pain medication for 1-3 days.   Comments on the Post-Operative Period Slight swelling and redness often appear around the wound. This is normal and will disappear within several days following the surgery. The healing wound will drain a brownish-red-yellow discharge during healing. This is a normal phase of wound healing. As the wound begins to heal, the drainage may increase in amount. Again, this drainage is normal. Notify us if the drainage becomes persistently bloody, excessively swollen, or intensely painful or develops a foul odor or red streaks.  The healing wound will also typically be itchy. This is normal. If you have severe or persistent pain, Notify us if the discomfort is severe or persistent. Avoid alcoholic beverages when taking pain medicine.  In Case of Wound Hemorrhage A wound hemorrhage is when the bandage suddenly becomes soaked with bright red blood and flows profusely. If this happens, sit down or lie down with your head elevated. If the wound has a dressing on it, do not remove the dressing. Apply pressure to the existing gauze. If the wound is not covered, use a gauze pad to apply pressure and continue applying the pressure for 20 minutes without peeking. DO NOT COVER THE WOUND WITH A LARGE TOWEL OR WASH CLOTH. Release your hand  from the wound site but do not remove the dressing. If the bleeding has stopped, gently clean around the wound. Leave the dressing in place for 24 hours if possible. This wait time allows the blood vessels to close off so that you do not spark a new round of bleeding by disrupting the newly clotted blood vessels with an immediate dressing change. If the bleeding does not subside, continue to hold pressure for 40 minutes. If bleeding continues, page your physician, contact an After Hours clinic or go to the Emergency Room.

## 2023-12-28 NOTE — Progress Notes (Signed)
   Follow Up Visit   Subjective  Austin Dillon is a 73 y.o. male who presents for the following: follow up from Mohs surgery   The patient presents for follow up from Mohs surgery for an SCCIS on the right buccal check, treated on 11/20/23, repaired with linear closure. The patient has been bandaging the wound as directed. The endorse the following concerns: No questions or concerns at this time.  The following portions of the chart were reviewed this encounter and updated as appropriate: medications, allergies, medical history  Review of Systems:  No other skin or systemic complaints except as noted in HPI or Assessment and Plan.  Objective  Well appearing patient in no apparent distress; mood and affect are within normal limits.  A full examination was performed including scalp, head, and face. All findings within normal limits unless otherwise noted below.  Healing wound with mild erythema  Relevant physical exam findings are noted in the Assessment and Plan.    Assessment & Plan    Healing s/p Mohs for scc, treated on 11/20/23, repaired with linear closure - Reassured that wound is healing well - No evidence of infection - No swelling, induration, purulence, dehiscence, or tenderness out of proportion to the clinical exam, see photo above - Discussed that scars take up to 12 months to mature from the date of surgery - Recommend SPF 30+ to scar daily to prevent purple color from UV exposure during scar maturation process - Discussed that erythema and raised appearance of scar will fade over the next 4-6 months - OK to start scar massage at 4-6 weeks post-op - Can consider silicone based products for scar healing starting at 6 weeks post-op - Ok to discontinue ointment daily to wound        Documentation: I have reviewed the above documentation for accuracy and completeness, and I agree with the above.  Gwenith Daily, MD
